# Patient Record
Sex: Female | Born: 1965 | Race: Black or African American | Hispanic: No | Marital: Single | Smoking: Never smoker
Health system: Southern US, Community
[De-identification: ages and names within clinical notes are randomized; demographics above are authoritative.]

## PROBLEM LIST (undated history)

## (undated) DIAGNOSIS — G40909 Epilepsy, unspecified, not intractable, without status epilepticus: Secondary | ICD-10-CM

## (undated) DIAGNOSIS — F17200 Nicotine dependence, unspecified, uncomplicated: Secondary | ICD-10-CM

## (undated) DIAGNOSIS — F1020 Alcohol dependence, uncomplicated: Secondary | ICD-10-CM

## (undated) DIAGNOSIS — D509 Iron deficiency anemia, unspecified: Secondary | ICD-10-CM

## (undated) DIAGNOSIS — I10 Essential (primary) hypertension: Secondary | ICD-10-CM

## (undated) HISTORY — DX: Nicotine dependence, unspecified, uncomplicated: F17.200

## (undated) HISTORY — DX: Essential (primary) hypertension: I10

## (undated) HISTORY — DX: Alcohol dependence, uncomplicated: F10.20

## (undated) HISTORY — PX: OTHER SURGICAL HISTORY: SHX169

## (undated) HISTORY — DX: Epilepsy, unspecified, not intractable, without status epilepticus: G40.909

## (undated) HISTORY — DX: Iron deficiency anemia, unspecified: D50.9

---

## 2000-08-30 ENCOUNTER — Encounter: Payer: Self-pay | Admitting: *Deleted

## 2000-08-30 ENCOUNTER — Emergency Department (HOSPITAL_COMMUNITY): Admission: EM | Admit: 2000-08-30 | Discharge: 2000-08-30 | Payer: Self-pay | Admitting: *Deleted

## 2000-11-15 ENCOUNTER — Emergency Department (HOSPITAL_COMMUNITY): Admission: EM | Admit: 2000-11-15 | Discharge: 2000-11-15 | Payer: Self-pay | Admitting: Emergency Medicine

## 2001-04-20 ENCOUNTER — Emergency Department (HOSPITAL_COMMUNITY): Admission: EM | Admit: 2001-04-20 | Discharge: 2001-04-20 | Payer: Self-pay | Admitting: Emergency Medicine

## 2001-09-01 ENCOUNTER — Encounter: Payer: Self-pay | Admitting: *Deleted

## 2001-09-01 ENCOUNTER — Emergency Department (HOSPITAL_COMMUNITY): Admission: EM | Admit: 2001-09-01 | Discharge: 2001-09-01 | Payer: Self-pay | Admitting: *Deleted

## 2002-01-17 ENCOUNTER — Observation Stay (HOSPITAL_COMMUNITY): Admission: EM | Admit: 2002-01-17 | Discharge: 2002-01-18 | Payer: Self-pay | Admitting: Emergency Medicine

## 2002-01-17 ENCOUNTER — Encounter: Payer: Self-pay | Admitting: Emergency Medicine

## 2002-09-29 ENCOUNTER — Emergency Department (HOSPITAL_COMMUNITY): Admission: EM | Admit: 2002-09-29 | Discharge: 2002-09-29 | Payer: Self-pay | Admitting: Emergency Medicine

## 2002-09-30 ENCOUNTER — Emergency Department (HOSPITAL_COMMUNITY): Admission: EM | Admit: 2002-09-30 | Discharge: 2002-09-30 | Payer: Self-pay | Admitting: Emergency Medicine

## 2002-10-01 ENCOUNTER — Emergency Department (HOSPITAL_COMMUNITY): Admission: EM | Admit: 2002-10-01 | Discharge: 2002-10-01 | Payer: Self-pay | Admitting: *Deleted

## 2002-10-02 ENCOUNTER — Emergency Department (HOSPITAL_COMMUNITY): Admission: EM | Admit: 2002-10-02 | Discharge: 2002-10-02 | Payer: Self-pay | Admitting: *Deleted

## 2002-12-03 ENCOUNTER — Emergency Department (HOSPITAL_COMMUNITY): Admission: EM | Admit: 2002-12-03 | Discharge: 2002-12-03 | Payer: Self-pay | Admitting: Emergency Medicine

## 2002-12-03 ENCOUNTER — Inpatient Hospital Stay (HOSPITAL_COMMUNITY): Admission: EM | Admit: 2002-12-03 | Discharge: 2002-12-05 | Payer: Self-pay | Admitting: Emergency Medicine

## 2004-09-26 ENCOUNTER — Emergency Department (HOSPITAL_COMMUNITY): Admission: EM | Admit: 2004-09-26 | Discharge: 2004-09-26 | Payer: Self-pay | Admitting: Emergency Medicine

## 2005-03-17 ENCOUNTER — Ambulatory Visit: Payer: Self-pay | Admitting: Family Medicine

## 2005-08-11 ENCOUNTER — Ambulatory Visit: Payer: Self-pay | Admitting: Family Medicine

## 2006-10-21 ENCOUNTER — Emergency Department (HOSPITAL_COMMUNITY): Admission: EM | Admit: 2006-10-21 | Discharge: 2006-10-21 | Payer: Self-pay | Admitting: Emergency Medicine

## 2006-10-27 ENCOUNTER — Ambulatory Visit: Payer: Self-pay | Admitting: Family Medicine

## 2006-11-02 ENCOUNTER — Encounter: Payer: Self-pay | Admitting: Family Medicine

## 2006-11-02 LAB — CONVERTED CEMR LAB
BUN: 5 mg/dL — ABNORMAL LOW (ref 6–23)
Basophils Absolute: 0.1 10*3/uL (ref 0.0–0.1)
Basophils Relative: 1 % (ref 0–1)
CO2: 25 meq/L (ref 19–32)
Calcium: 9.5 mg/dL (ref 8.4–10.5)
Chloride: 95 meq/L — ABNORMAL LOW (ref 96–112)
Creatinine, Ser: 0.64 mg/dL (ref 0.40–1.20)
Eosinophils Relative: 2 % (ref 0–5)
HCT: 39.5 % (ref 36.0–46.0)
HDL: 50 mg/dL (ref >39)
Hemoglobin: 12.2 g/dL (ref 12.0–15.0)
LDL Cholesterol: 72 mg/dL (ref 0–99)
Lymphocytes Relative: 22 % (ref 12–46)
MCHC: 30.9 g/dL (ref 30.0–36.0)
Monocytes Absolute: 1.2 10*3/uL — ABNORMAL HIGH (ref 0.2–0.7)
Monocytes Relative: 8 % (ref 3–11)
Neutro Abs: 10.3 10*3/uL — ABNORMAL HIGH (ref 1.7–7.7)
Phenytoin Lvl: 4.3 ug/mL — ABNORMAL LOW (ref 10.0–20.0)
RBC: 3.64 M/uL — ABNORMAL LOW (ref 3.87–5.11)
RDW: 13.8 % (ref 11.5–14.0)

## 2006-11-09 ENCOUNTER — Ambulatory Visit (HOSPITAL_COMMUNITY): Admission: RE | Admit: 2006-11-09 | Discharge: 2006-11-09 | Payer: Self-pay | Admitting: Family Medicine

## 2006-12-11 ENCOUNTER — Emergency Department (HOSPITAL_COMMUNITY): Admission: EM | Admit: 2006-12-11 | Discharge: 2006-12-11 | Payer: Self-pay | Admitting: Emergency Medicine

## 2006-12-14 ENCOUNTER — Emergency Department (HOSPITAL_COMMUNITY): Admission: EM | Admit: 2006-12-14 | Discharge: 2006-12-14 | Payer: Self-pay | Admitting: Emergency Medicine

## 2007-03-17 ENCOUNTER — Ambulatory Visit: Payer: Self-pay | Admitting: Family Medicine

## 2007-04-27 ENCOUNTER — Encounter: Payer: Self-pay | Admitting: Family Medicine

## 2007-11-10 DIAGNOSIS — R569 Unspecified convulsions: Secondary | ICD-10-CM

## 2007-11-10 DIAGNOSIS — F17200 Nicotine dependence, unspecified, uncomplicated: Secondary | ICD-10-CM

## 2007-11-15 ENCOUNTER — Ambulatory Visit: Payer: Self-pay | Admitting: Family Medicine

## 2007-11-15 LAB — CONVERTED CEMR LAB
ALT: 36 units/L — ABNORMAL HIGH (ref 0–35)
Albumin: 3.6 g/dL (ref 3.5–5.2)
Chloride: 104 meq/L (ref 96–112)
Cholesterol: 184 mg/dL (ref 0–200)
HDL: 87 mg/dL (ref >39)
LDL Cholesterol: 82 mg/dL (ref 0–99)
Phenytoin Lvl: 5.1 ug/mL — ABNORMAL LOW (ref 10.0–20.0)
Potassium: 4.2 meq/L (ref 3.5–5.3)
Sodium: 141 meq/L (ref 135–145)
TSH: 1.426 microintl units/mL (ref 0.350–4.50)
Total CHOL/HDL Ratio: 2.1
Total Protein: 7.8 g/dL (ref 6.0–8.3)
Triglycerides: 77 mg/dL (ref <150)
VLDL: 15 mg/dL (ref 0–40)

## 2008-03-11 ENCOUNTER — Emergency Department (HOSPITAL_COMMUNITY): Admission: EM | Admit: 2008-03-11 | Discharge: 2008-03-11 | Payer: Self-pay | Admitting: Emergency Medicine

## 2008-03-12 ENCOUNTER — Encounter: Payer: Self-pay | Admitting: Family Medicine

## 2008-10-26 ENCOUNTER — Emergency Department (HOSPITAL_COMMUNITY): Admission: EM | Admit: 2008-10-26 | Discharge: 2008-10-26 | Payer: Self-pay | Admitting: Emergency Medicine

## 2008-11-08 ENCOUNTER — Ambulatory Visit: Payer: Self-pay | Admitting: Family Medicine

## 2008-11-08 DIAGNOSIS — R5381 Other malaise: Secondary | ICD-10-CM

## 2008-11-08 DIAGNOSIS — R5383 Other fatigue: Secondary | ICD-10-CM

## 2008-11-13 ENCOUNTER — Ambulatory Visit (HOSPITAL_COMMUNITY): Admission: RE | Admit: 2008-11-13 | Discharge: 2008-11-13 | Payer: Self-pay | Admitting: Family Medicine

## 2008-11-21 ENCOUNTER — Encounter: Payer: Self-pay | Admitting: Family Medicine

## 2008-11-25 ENCOUNTER — Encounter: Payer: Self-pay | Admitting: Family Medicine

## 2008-11-25 LAB — CONVERTED CEMR LAB
ALT: 26 units/L (ref 0–35)
Albumin: 3.3 g/dL — ABNORMAL LOW (ref 3.5–5.2)
Alkaline Phosphatase: 112 units/L (ref 39–117)
BUN: 4 mg/dL — ABNORMAL LOW (ref 6–23)
Basophils Absolute: 0.1 10*3/uL (ref 0.0–0.1)
Cholesterol: 167 mg/dL (ref 0–200)
Creatinine, Ser: 0.55 mg/dL (ref 0.40–1.20)
Eosinophils Relative: 3 % (ref 0–5)
HCT: 31.4 % — ABNORMAL LOW (ref 36.0–46.0)
HDL: 55 mg/dL (ref >39)
Indirect Bilirubin: 0.3 mg/dL (ref 0.0–0.9)
Lymphocytes Relative: 30 % (ref 12–46)
Lymphs Abs: 3 10*3/uL (ref 0.7–4.0)
Neutro Abs: 5.4 10*3/uL (ref 1.7–7.7)
Neutrophils Relative %: 54 % (ref 43–77)
Platelets: 283 10*3/uL (ref 150–400)
Potassium: 4.4 meq/L (ref 3.5–5.3)
RDW: 17.7 % — ABNORMAL HIGH (ref 11.5–15.5)
Total Protein: 7.5 g/dL (ref 6.0–8.3)
Triglycerides: 96 mg/dL (ref <150)
VLDL: 19 mg/dL (ref 0–40)
WBC: 10 10*3/uL (ref 4.0–10.5)

## 2008-11-27 ENCOUNTER — Encounter: Payer: Self-pay | Admitting: Family Medicine

## 2009-02-10 ENCOUNTER — Encounter: Payer: Self-pay | Admitting: Family Medicine

## 2009-02-26 ENCOUNTER — Encounter: Payer: Self-pay | Admitting: Family Medicine

## 2009-03-02 ENCOUNTER — Emergency Department (HOSPITAL_COMMUNITY): Admission: EM | Admit: 2009-03-02 | Discharge: 2009-03-02 | Payer: Self-pay | Admitting: Emergency Medicine

## 2009-04-07 ENCOUNTER — Encounter (INDEPENDENT_AMBULATORY_CARE_PROVIDER_SITE_OTHER): Payer: Self-pay | Admitting: *Deleted

## 2009-11-27 ENCOUNTER — Telehealth: Payer: Self-pay | Admitting: Family Medicine

## 2009-11-30 ENCOUNTER — Emergency Department (HOSPITAL_COMMUNITY): Admission: EM | Admit: 2009-11-30 | Discharge: 2009-11-30 | Payer: Self-pay | Admitting: Emergency Medicine

## 2009-12-01 ENCOUNTER — Ambulatory Visit: Payer: Self-pay | Admitting: Family Medicine

## 2009-12-01 DIAGNOSIS — R1013 Epigastric pain: Secondary | ICD-10-CM

## 2009-12-01 DIAGNOSIS — D509 Iron deficiency anemia, unspecified: Secondary | ICD-10-CM | POA: Insufficient documentation

## 2009-12-01 DIAGNOSIS — K3189 Other diseases of stomach and duodenum: Secondary | ICD-10-CM

## 2009-12-08 ENCOUNTER — Telehealth: Payer: Self-pay | Admitting: Family Medicine

## 2009-12-08 ENCOUNTER — Encounter (HOSPITAL_COMMUNITY): Admission: RE | Admit: 2009-12-08 | Discharge: 2010-01-07 | Payer: Self-pay | Admitting: Family Medicine

## 2009-12-08 ENCOUNTER — Ambulatory Visit (HOSPITAL_COMMUNITY): Payer: Self-pay | Admitting: Family Medicine

## 2009-12-08 DIAGNOSIS — F1020 Alcohol dependence, uncomplicated: Secondary | ICD-10-CM | POA: Insufficient documentation

## 2009-12-09 ENCOUNTER — Ambulatory Visit (HOSPITAL_COMMUNITY): Payer: Self-pay | Admitting: Family Medicine

## 2009-12-10 ENCOUNTER — Encounter: Payer: Self-pay | Admitting: Family Medicine

## 2009-12-11 ENCOUNTER — Encounter: Payer: Self-pay | Admitting: Family Medicine

## 2009-12-12 LAB — CONVERTED CEMR LAB
ALT: 22 units/L (ref 0–35)
AST: 40 units/L — ABNORMAL HIGH (ref 0–37)
Albumin: 3.8 g/dL (ref 3.5–5.2)
Basophils Absolute: 0.1 10*3/uL (ref 0.0–0.1)
Basophils Relative: 1 % (ref 0–1)
Bilirubin, Direct: <0.1 mg/dL (ref 0.0–0.3)
Calcium: 8.9 mg/dL (ref 8.4–10.5)
Cholesterol: 172 mg/dL (ref 0–200)
Eosinophils Absolute: 0.3 10*3/uL (ref 0.0–0.7)
Eosinophils Relative: 3 % (ref 0–5)
HCT: 24.7 % — ABNORMAL LOW (ref 36.0–46.0)
HDL: 67 mg/dL (ref >39)
MCV: 71.4 fL — ABNORMAL LOW (ref 78.0–100.0)
Platelets: 223 10*3/uL (ref 150–400)
RDW: 20.7 % — ABNORMAL HIGH (ref 11.5–15.5)
Sodium: 135 meq/L (ref 135–145)
TSH: 0.994 microintl units/mL (ref 0.350–4.500)
Total CHOL/HDL Ratio: 2.6
Triglycerides: 62 mg/dL (ref <150)

## 2010-01-01 ENCOUNTER — Other Ambulatory Visit: Admission: RE | Admit: 2010-01-01 | Discharge: 2010-01-01 | Payer: Self-pay | Admitting: Family Medicine

## 2010-01-01 ENCOUNTER — Encounter: Payer: Self-pay | Admitting: Physician Assistant

## 2010-01-01 ENCOUNTER — Ambulatory Visit: Payer: Self-pay | Admitting: Family Medicine

## 2010-01-01 LAB — CONVERTED CEMR LAB: OCCULT 1: NEGATIVE

## 2010-01-07 ENCOUNTER — Encounter: Payer: Self-pay | Admitting: Family Medicine

## 2010-01-20 ENCOUNTER — Encounter: Payer: Self-pay | Admitting: Family Medicine

## 2010-01-20 LAB — CONVERTED CEMR LAB
Basophils Relative: 2 % — ABNORMAL HIGH (ref 0–1)
Hemoglobin: 10 g/dL — ABNORMAL LOW (ref 12.0–15.0)
Lymphocytes Relative: 36 % (ref 12–46)
MCHC: 30.1 g/dL (ref 30.0–36.0)
Monocytes Absolute: 1.3 10*3/uL — ABNORMAL HIGH (ref 0.1–1.0)
Monocytes Relative: 13 % — ABNORMAL HIGH (ref 3–12)
Neutro Abs: 4.2 10*3/uL (ref 1.7–7.7)
Phenytoin Lvl: 14.1 ug/mL (ref 10.0–20.0)
RBC: 4.22 M/uL (ref 3.87–5.11)

## 2010-01-30 ENCOUNTER — Telehealth: Payer: Self-pay | Admitting: Family Medicine

## 2010-02-09 ENCOUNTER — Encounter (INDEPENDENT_AMBULATORY_CARE_PROVIDER_SITE_OTHER): Payer: Self-pay | Admitting: *Deleted

## 2010-02-24 ENCOUNTER — Encounter (INDEPENDENT_AMBULATORY_CARE_PROVIDER_SITE_OTHER): Payer: Self-pay | Admitting: *Deleted

## 2010-02-24 ENCOUNTER — Ambulatory Visit: Payer: Self-pay | Admitting: Internal Medicine

## 2010-02-24 DIAGNOSIS — A048 Other specified bacterial intestinal infections: Secondary | ICD-10-CM

## 2010-02-24 HISTORY — DX: Other specified bacterial intestinal infections: A04.8

## 2010-03-03 ENCOUNTER — Ambulatory Visit: Payer: Self-pay | Admitting: Internal Medicine

## 2010-03-10 ENCOUNTER — Ambulatory Visit (HOSPITAL_COMMUNITY): Admission: RE | Admit: 2010-03-10 | Discharge: 2010-03-10 | Payer: Self-pay | Admitting: Internal Medicine

## 2010-03-10 ENCOUNTER — Ambulatory Visit: Payer: Self-pay | Admitting: Internal Medicine

## 2010-03-25 ENCOUNTER — Encounter: Payer: Self-pay | Admitting: Internal Medicine

## 2010-03-26 ENCOUNTER — Encounter: Payer: Self-pay | Admitting: Internal Medicine

## 2010-04-07 ENCOUNTER — Ambulatory Visit: Payer: Self-pay | Admitting: Family Medicine

## 2010-04-21 ENCOUNTER — Ambulatory Visit: Payer: Self-pay | Admitting: Internal Medicine

## 2010-04-21 DIAGNOSIS — I10 Essential (primary) hypertension: Secondary | ICD-10-CM

## 2010-04-28 ENCOUNTER — Ambulatory Visit (HOSPITAL_COMMUNITY)
Admission: RE | Admit: 2010-04-28 | Discharge: 2010-04-28 | Payer: Self-pay | Source: Home / Self Care | Attending: Family Medicine | Admitting: Family Medicine

## 2010-05-17 ENCOUNTER — Encounter: Payer: Self-pay | Admitting: Family Medicine

## 2010-05-18 ENCOUNTER — Encounter: Payer: Self-pay | Admitting: Family Medicine

## 2010-05-21 ENCOUNTER — Encounter: Payer: Self-pay | Admitting: Gastroenterology

## 2010-05-21 LAB — CONVERTED CEMR LAB
Basophils Relative: 1 % (ref 0–1)
Eosinophils Absolute: 0.4 10*3/uL (ref 0.0–0.7)
Eosinophils Relative: 3 % (ref 0–5)
MCHC: 30.9 g/dL (ref 30.0–36.0)
MCV: 90.1 fL (ref 78.0–100.0)
Neutrophils Relative %: 59 % (ref 43–77)
Platelets: 346 10*3/uL (ref 150–400)

## 2010-05-22 LAB — CONVERTED CEMR LAB: Phenytoin Lvl: 20.4 ug/mL — ABNORMAL HIGH (ref 10.0–20.0)

## 2010-05-23 ENCOUNTER — Emergency Department (HOSPITAL_COMMUNITY)
Admission: EM | Admit: 2010-05-23 | Discharge: 2010-05-23 | Payer: Self-pay | Source: Home / Self Care | Admitting: Emergency Medicine

## 2010-05-23 LAB — URINALYSIS, ROUTINE W REFLEX MICROSCOPIC
Leukocytes, UA: NEGATIVE
Nitrite: POSITIVE — AB
Specific Gravity, Urine: <1.005 — ABNORMAL LOW (ref 1.005–1.030)
Urobilinogen, UA: 0.2 mg/dL (ref 0.0–1.0)

## 2010-05-23 LAB — POCT PREGNANCY, URINE: Preg Test, Ur: NEGATIVE

## 2010-05-23 LAB — URINE MICROSCOPIC-ADD ON

## 2010-05-26 NOTE — Letter (Signed)
Summary: [facility] POSS EGD ORDER  [facility] POSS EGD ORDER   Imported By: Ave Filter 02/24/2010 12:01:26  _____________________________________________________________________  External Attachment:    Type:   Image     Comment:   External Document

## 2010-05-26 NOTE — Progress Notes (Signed)
  Phone Note Outgoing Call   Call placed by: Adella Hare, LPN Call placed to: Patient Summary of Call: attempted to call patient reguarding lab results, # in chart is disconnected, no other contact # in chart or in old messages. called [place] police dept and had them check patients home, they returned my call stating only that the officer spoke to someone at the residence and was told patient had just left for school Initial call taken by: Adella Hare LPN,  December 08, 2009 9:12 AM

## 2010-05-26 NOTE — Letter (Signed)
Summary: Out of Work Note  [facility]  [address]   [place], [place] [address]   Phone: [phone]  Fax: [phone]    02/24/2010  TO: WHOM IT MAY CONCERN  RE: Cynthia Mclean [address] [place] 1965/09/27       The above named individual is currently under my care and will be out of work    FROM: 02/24/2010   THROUGH: 02/24/2010     MAY RETURN ON: 02/24/2010     If you have any further questions or need additional information, please call.     Sincerely,     [facility] R. Roetta Sessions, M.D.    Jonette Eva, M.D. Lorenza Burton, FNP-BC    Tana Coast, PA-C Phone: [phone]    Fax: [phone]

## 2010-05-26 NOTE — Letter (Signed)
Summary: demographic  demographic   Imported By: Curtis Sites 10/15/2009 10:09:36  _____________________________________________________________________  External Attachment:    Type:   Image     Comment:   External Document

## 2010-05-26 NOTE — Progress Notes (Signed)
Summary: refill blood pressure pills  Phone Note Call from Patient   Summary of Call: would like to get a refill on blood pressure medicine. [phone] [facility]  Initial call taken by: Rudene Anda,  January 30, 2010 10:23 AM  Follow-up for Phone Call        PATIENT IS NOT ON ANY BP MEDS FROM THIS OFFICE CALLED PATIENT, LEFT MESSAGE Follow-up by: Adella Hare LPN,  January 30, 2010 11:08 AM  Additional Follow-up for Phone Call Additional follow up Details #1::        returned call, left message Additional Follow-up by: Adella Hare LPN,  January 30, 2010 1:34 PM

## 2010-05-26 NOTE — Assessment & Plan Note (Signed)
Summary: follow up and needs note for work/slj   Vital Signs:  Patient profile:   45 year old female Menstrual status:  regular LMP:     11/04/2009 Height:      65.5 inches Weight:      162 pounds BMI:     26.64 O2 Sat:      94 % Pulse rate:   83 / minute Pulse rhythm:   regular Resp:     16 per minute BP sitting:   130 / 82  (left arm) Cuff size:   regular  Vitals Entered By: Everitt Amber LPN (December 01, 2009 4:11 PM)  Nutrition Counseling: Patient's BMI is greater than 25 and therefore counseled on weight management options. CC: Follow up chronic problems, Depression LMP (date): 11/04/2009     Enter LMP: 11/04/2009   CC:  Follow up chronic problems and Depression.  History of Present Illness: Reports  that she has been doing fairly well. She still drinks alcohol, repportedly less, ansd continues to smoke. She denies any seizure activity since her last visit. Denies recent fever or chills. Denies sinus pressure, nasal congestion , ear pain or sore throat. Denies chest congestion, or cough productive of sputum. Denies chest pain, palpitations, PND, orthopnea or leg swelling. Deniesnausea, vomitting, diarrhea or constipation. Denies change in bowel movements or bloody stool. Denies dysuria , frequency, incontinence or hesitancy. Denies  joint pain, swelling, or reduced mobility. Denies headaches, vertigo, seizures. Denies depression, anxiety or insomnia. Denies  rash, lesions, or itch.     Current Medications (verified): 1)  Dilantin 100 Mg  Caps (Phenytoin Sodium Extended) .... Take Three Tablets By Mouth At Bedtime  Allergies (verified): No Known Drug Allergies  Review of Systems      See HPI General:  Complains of fatigue. Eyes:  Denies blurring and discharge. GI:  Complains of abdominal pain; 2 month h/o dyspepsia and epigastric pain intermittently. Endo:  Denies excessive thirst and excessive urination. Heme:  Denies abnormal bruising and  bleeding. Allergy:  Denies hives or rash and itching eyes.  Physical Exam  General:  Well-developed,well-nourished,in no acute distress; alert,appropriate and cooperative throughout examination HEENT: No facial asymmetry,  EOMI, No sinus tenderness, TM's Clear, oropharynx  pink and moist.   Chest: Clear to auscultation bilaterally.  CVS: S1, S2, No murmurs, No S3.   Abd: Soft, Nontender.  MS: Adequate ROM spine, hips, shoulders and knees.  Ext: No edema.   CNS: CN 2-12 intact, power tone and sensation normal throughout.   Skin: Intact, no visible lesions or rashes.  Psych: Good eye contact, normal affect.  Memory intact, not anxious or depressed appearing.    Impression & Recommendations:  Problem # 1:  DYSPEPSIA (ICD-536.8) Assessment Deteriorated  Orders: TLB-H. Pylori Abs(Helicobacter Pylori) ([id number])  Problem # 2:  ANEMIA, IRON DEFICIENCY (ICD-280.9) Assessment: Comment Only  Orders: T-Iron ([id number])  Hgb: 9.7 (11/21/2008)   Hct: 31.4 (11/21/2008)   Platelets: 283 (11/21/2008) RBC: 3.36 (11/21/2008)   RDW: 17.7 (11/21/2008)   WBC: 10.0 (11/21/2008) MCV: 93.5 (11/21/2008)   MCHC: 30.9 (11/21/2008) Retic Ct: TNP K/uL (11/25/2008)   Ferritin: 21 (11/25/2008) Iron: 18 (11/25/2008)   TIBC: 232 (11/25/2008)   % Sat: 8 (11/25/2008) B12: 450 (11/25/2008)   Folate: 4.8 (11/25/2008)   TSH: 1.426 (11/15/2007)  Problem # 3:  FATIGUE (ICD-780.79) Assessment: Unchanged  Orders: T-Basic Metabolic Panel [id number]) T-CBC w/Diff [id number]) T-TSH [phone])  Problem # 4:  NICOTINE ADDICTION (ICD-305.1) Assessment: Unchanged  Encouraged smoking cessation and discussed  different methods for smoking cessation.   Problem # 5:  SEIZURE DISORDER (ICD-780.39) Assessment: Unchanged  Her updated medication list for this problem includes:    Dilantin 100 Mg Caps (Phenytoin sodium extended) .Marland Kitchen... Take three tablets by mouth at bedtime  Orders: T-Dilantin  (Phenytoin) ([id number])  Problem # 6:  ALCOHOLISM (ICD-303.90) Assessment: Unchanged  Complete Medication List: 1)  Dilantin 100 Mg Caps (Phenytoin sodium extended) .... Take three tablets by mouth at bedtime  Other Orders: T-Lipid Profile [id number]) T-Hepatic Function [phone]) Future Orders: Radiology Referral (Radiology) ... 12/02/2009   Patient Instructions: 1)  CPE  first week in September. 2)  It is important that you exercise regularly at least 20 minutes 5 times a week. If you develop chest pain, have severe difficulty breathing, or feel very tired , stop exercising immediately and seek medical attention. 3)  You need to lose weight. Consider a lower calorie diet and regular exercise.  4)  It is not healthy  for men to drink more than 2-3 drinks per day or for women to drink more than 1-2 drinks per day. 5)  BMP prior to visit, ICD-9: 6)  Lipid Panel prior to visit, ICD-9:, hepatic and dilantin level  fasting asap 7)  TSH prior to visit, ICD-9: 8)  CBC w/ Diff prior to visit, ICD-9: and iron level, h pylori

## 2010-05-26 NOTE — Miscellaneous (Signed)
  Clinical Lists Changes  Medications: Added new medication of PHENYTOIN SODIUM EXTENDED 300 MG CAPS (PHENYTOIN SODIUM EXTENDED) Take 1 capsule by mouth two times a day - Signed Rx of PHENYTOIN SODIUM EXTENDED 300 MG CAPS (PHENYTOIN SODIUM EXTENDED) Take 1 capsule by mouth two times a day;  #60 x 3;  Signed;  Entered by: [name] MD;  Authorized by: [name] MD;  Method used: Printed then faxed to [facility] [address] [phone]*, [address], [place], [place]  [address], Ph: [phone], Fax: [phone]    Prescriptions: PHENYTOIN SODIUM EXTENDED 300 MG CAPS (PHENYTOIN SODIUM EXTENDED) Take 1 capsule by mouth two times a day  #60 x 3   Entered and Authorized by:   [name] MD   Signed by:   [name] MD on 12/11/2009   Method used:   Printed then faxed to ...       [facility] [address] [phone]* (retail)       [address], [place]  [address]       Ph: [phone]       Fax: [phone]   RxID:   [id number]

## 2010-05-26 NOTE — Letter (Signed)
Summary: Work Excuse  [facility]  [address]   [place], [place] [address]   Phone: [phone]  Fax: [phone]    Today's Date: January 01, 2010  Name of Patient: Cynthia Mclean  The above named patient had a medical visit today. Patient was in our office from 2pm to 3:40 pm.  Please take this into consideration when reviewing the time away from work/school.    Special Instructions:  [ * ] None  [  ] To be off the remainder of today, returning to the normal work / school schedule tomorrow.  [  ] To be off until the next scheduled appointment on ______________________.  [  ] Other ________________________________________________________________ ________________________________________________________________________   Sincerely yours,   [name], MD

## 2010-05-26 NOTE — Miscellaneous (Signed)
  Clinical Lists Changes  Medications: Added new medication of PREVPAC  MISC (AMOXICILL-CLARITHRO-LANSOPRAZ) Use as directed - Signed Rx of PREVPAC  MISC (AMOXICILL-CLARITHRO-LANSOPRAZ) Use as directed;  #1 x 0;  Signed;  Entered by: [name] MD;  Authorized by: [name] MD;  Method used: Electronically to [facility] [address] [phone]*, [address], [place], [place]  [address], Ph: [phone], Fax: [phone]    Prescriptions: PREVPAC  MISC (AMOXICILL-CLARITHRO-LANSOPRAZ) Use as directed  #1 x 0   Entered and Authorized by:   [name] MD   Signed by:   [name] MD on 12/10/2009   Method used:   Electronically to        [facility] [address] [phone]* (retail)       [address], [place]  [address]       Ph: [phone]       Fax: [phone]   RxID:   [id number]

## 2010-05-26 NOTE — Progress Notes (Signed)
Summary: note for work  Phone Note Call from Patient   Summary of Call: Patient called in this morning and needs a note for her work stating that you give her medication for seizure's.  She would like to pick note up today. Thanks Initial call taken by: Curtis Sites,  November 27, 2009 8:56 AM  Follow-up for Phone Call        she needs to be seen as an appt, has not been here since 02/2009, i am sending her for bloodwoprk at that time, she cvan come in today, pls give her a time, thanks Follow-up by: [name] MD,  November 27, 2009 10:23 AM  Additional Follow-up for Phone Call Additional follow up Details #1::        patient called back and didn't want an appt, states she hasn't been sick or needed to come in, I told her that you requested her to come in and she states she will call us back. Additional Follow-up by: Curtis Sites,  November 27, 2009 3:33 PM    Additional Follow-up for Phone Call Additional follow up Details #2::    noted Follow-up by: [name] MD,  November 27, 2009 4:17 PM

## 2010-05-26 NOTE — Letter (Signed)
Summary: misc  misc   Imported By: Curtis Sites 10/15/2009 11:30:47  _____________________________________________________________________  External Attachment:    Type:   Image     Comment:   External Document

## 2010-05-26 NOTE — Assessment & Plan Note (Signed)
Summary: physical   Vital Signs:  Patient profile:   45 year old female Menstrual status:  regular LMP:     12/08/2009 Height:      65.5 inches Weight:      158.25 pounds BMI:     26.03 O2 Sat:      100 % on Room air Pulse rate:   73 / minute Pulse rhythm:   regular Resp:     16 per minute BP sitting:   142 / 90  (left arm)  Vitals Entered By: Adella Hare LPN (January 01, 2010 2:51 PM)  Nutrition Counseling: Patient's BMI is greater than 25 and therefore counseled on weight management options.  O2 Flow:  Room air CC: physical Is Patient Diabetic? No Pain Assessment Patient in pain? no       Vision Screening:Left eye w/o correction: 20 / 40 Right Eye w/o correction: 20 / 20 Both eyes w/o correction:  20/ 30        Vision Entered By: Adella Hare LPN (January 01, 2010 2:52 PM) LMP (date): 12/08/2009     Enter LMP: 12/08/2009   CC:  physical.  History of Present Illness: Reports  that she has been doing well. Denies recent fever or chills. Denies sinus pressure, nasal congestion , ear pain or sore throat. Denies chest congestion, or cough productive of sputum. Denies chest pain, palpitations, PND, orthopnea or leg swelling. Denies abdominal pain, nausea, vomitting, diarrhea or constipation. Denies change in bowel movements or bloody stool. Denies dysuria , frequency, incontinence or hesitancy. Denies  joint pain, swelling, or reduced mobility. Denies headaches, vertigo, seizures. Denies depression, anxiety or insomnia. Denies  rash, lesions, or itch.     Preventive Screening-Counseling & Management  Alcohol-Tobacco     Smoking Cessation Counseling: yes  Current Medications (verified): 1)  Prevpac  Misc (Amoxicill-Clarithro-Lansopraz) .... Use As Directed 2)  Phenytoin Sodium Extended 300 Mg Caps (Phenytoin Sodium Extended) .... Take 1 Capsule By Mouth Two Times A Day  Allergies (verified): No Known Drug Allergies  Review of Systems  See HPI Eyes:  Denies discharge, eye pain, and red eye. Endo:  Denies excessive thirst and excessive urination. Heme:  Denies abnormal bruising and bleeding. Allergy:  Denies hives or rash and itching eyes.  Physical Exam  General:  Well-developed,well-nourished,in no acute distress; alert,appropriate and cooperative throughout examination Head:  Normocephalic and atraumatic without obvious abnormalities. No apparent alopecia or balding. Eyes:  No corneal or conjunctival inflammation noted. EOMI. Perrla. Funduscopic exam benign, without hemorrhages, exudates or papilledema. Vision grossly normal. Ears:  External ear exam shows no significant lesions or deformities.  Otoscopic examination reveals clear canals, tympanic membranes are intact bilaterally without bulging, retraction, inflammation or discharge. Hearing is grossly normal bilaterally. Nose:  External nasal examination shows no deformity or inflammation. Nasal mucosa are pink and moist without lesions or exudates. Mouth:  pharynx pink and moist, poor dentition, and teeth missing.   Neck:  No deformities, masses, or tenderness noted. Chest Wall:  No deformities, masses, or tenderness noted. Breasts:  No mass, nodules, thickening, tenderness, bulging, retraction, inflamation, nipple discharge or skin changes noted.   Lungs:  Normal respiratory effort, chest expands symmetrically. Lungs are clear to auscultation, no crackles or wheezes.Decreased air entry bilaterally Heart:  Normal rate and regular rhythm. S1 and S2 normal without gallop, murmur, click, rub or other extra sounds. Abdomen:  Bowel sounds positive,abdomen soft and non-tender without masses, organomegaly or hernias noted. Rectal:  No external abnormalities noted.  Normal sphincter tone. No rectal masses or tenderness. Genitalia:  Normal introitus for age, no external lesions, no vaginal discharge, mucosa pink and moist, no vaginal or cervical lesions, no vaginal atrophy, no  friaility or hemorrhage, normal uterus size and position, no adnexal masses or tenderness Msk:  No deformity or scoliosis noted of thoracic or lumbar spine.   Pulses:  R and L carotid,radial,femoral,dorsalis pedis and posterior tibial pulses are full and equal bilaterally Extremities:  No clubbing, cyanosis, edema, or deformity noted with normal full range of motion of all joints.   Neurologic:  No cranial nerve deficits noted. Station and gait are normal. Plantar reflexes are down-going bilaterally. DTRs are symmetrical throughout. Sensory, motor and coordinative functions appear intact. Skin:  Intact without suspicious lesions or rashes Cervical Nodes:  No lymphadenopathy noted Axillary Nodes:  No palpable lymphadenopathy Inguinal Nodes:  No significant adenopathy Psych:  Cognition and judgment appear intact. Alert and cooperative with normal attention span and concentration. No apparent delusions, illusions, hallucinations   Impression & Recommendations:  Problem # 1:  ANEMIA, IRON DEFICIENCY (ICD-280.9) Assessment Comment Only  Orders: Gastroenterology Referral (GI) T-CBC w/Diff ([id number]) T-Ferritin ([id number]) Augusto Gamble ([id number]) Of note , pt was recently transfused  Problem # 2:  NICOTINE ADDICTION (ICD-305.1) Assessment: Unchanged  Encouraged smoking cessation and discussed different methods for smoking cessation.   Problem # 3:  SEIZURE DISORDER (ICD-780.39) Assessment: Comment Only  The following medications were removed from the medication list:    Dilantin 100 Mg Caps (Phenytoin sodium extended) .Marland Kitchen... Take three tablets by mouth at bedtime Her updated medication list for this problem includes:    Phenytoin Sodium Extended 300 Mg Caps (Phenytoin sodium extended) .Marland Kitchen... Take 1 capsule by mouth two times a day non compliant with meds Orders: T-Dilantin (Phenytoin) ([id number])  Complete Medication List: 1)  Prevpac Misc (Amoxicill-clarithro-lansopraz) .... Use  as directed 2)  Phenytoin Sodium Extended 300 Mg Caps (Phenytoin sodium extended) .... Take 1 capsule by mouth two times a day  Other Orders: Hemoccult Guaiac-1 spec.(in office) ([id number]) Pap Smear (18841)  Patient Instructions: 1)  Please schedule a follow-up appointment in 3 months. 2)  Tobacco is very bad for your health and your loved ones! You Should stop smoking!. 3)  Stop Smoking Tips: Choose a Quit date. Cut down before the Quit date. decide what you will do as a substitute when you feel the urge to smoke(gum,toothpick,exercise). 4)  It is not healthy  for men to drink more than 2-3 drinks per day or for women to drink more than 1-2 drinks per day. 5)  Your bP is high, you need to cut back on salt, and cigarettes, eat more fruit and veg  140/90 6)  CBC w/ Diff prior to visit, ICD-9:, iron and ferritin today and dilantin level 7)  You are being referred to GI about the anemia  Laboratory Results  Date/Time Received: January 01, 2010 3:46 PM  Date/Time Reported: January 01, 2010 3:46 PM   Stool - Occult Blood Hemmoccult #1: negative Date: 01/01/2010 Comments: [id number] 1L 3/12 118 10/12

## 2010-05-26 NOTE — Progress Notes (Signed)
  Phone Note Call from Patient   Summary of Call: Patient aware that med sent in  Initial call taken by: Everitt Amber LPN,  January 30, 2010 1:51 PM  Follow-up for Phone Call        pls advise med sent in , neeeds ov within 6 weeks, has only got 2 months supply Follow-up by: [name] MD,  January 30, 2010 1:49 PM    New/Updated Medications: LOTENSIN 10 MG TABS (BENAZEPRIL HCL) Take 1 tablet by mouth once a day Prescriptions: LOTENSIN 10 MG TABS (BENAZEPRIL HCL) Take 1 tablet by mouth once a day  #30 x 1   Entered and Authorized by:   [name] MD   Signed by:   [name] MD on 01/30/2010   Method used:   Electronically to        [facility] [address] [phone]* (retail)       [address], [place]  [address]       Ph: [phone]       Fax: [phone]   RxID:   [id number]

## 2010-05-26 NOTE — Miscellaneous (Signed)
Summary: Orders Update  Clinical Lists Changes  Orders: Added new Test order of T-CBC w/Diff ([id number]) - Signed 

## 2010-05-26 NOTE — Letter (Signed)
Summary: Letter  Letter   Imported By: Lind Guest 01/07/2010 14:33:20  _____________________________________________________________________  External Attachment:    Type:   Image     Comment:   External Document

## 2010-05-26 NOTE — Letter (Signed)
Summary: phone  phone   Imported By: Curtis Sites 10/15/2009 11:31:04  _____________________________________________________________________  External Attachment:    Type:   Image     Comment:   External Document

## 2010-05-26 NOTE — Assessment & Plan Note (Signed)
Summary: Cynthia Mclean DEFICIENCY/LAW   Visit Type:  Consult Referring Provider:  [name] Primary Care Provider:  [name]  Chief Complaint:  IDA.  History of Present Illness: Cynthia Mclean is a pleasant 45 y/o AA female, patient of Dr. [name], who presents for further evaluation of IDA. She has evidence of IDA dating back to 8/10. In 8/11, her hgb was 7.3 and patient states she had two units of prbcs. Last labs, hgb 10/33.2, MCV 78.7, iron 13, ferritin 20. Hemoccult status unknown. She denies constipation, diarrhea, melena, brbpr, abd pain, dysphagia, weight loss, n/v. She has some heartburn with certain foods. She denies heavy menses. Menstrual cycles once month, bleed five days, first two heavier. H.Pylori serologies were positive and she is currently taking Prevpac, has four days left. No prior TCS/EGD.   Current Medications (verified): 1)  Prevpac  Misc (Amoxicill-Clarithro-Lansopraz) .... Use As Directed 2)  Phenytoin Sodium Extended 300 Mg Caps (Phenytoin Sodium Extended) .... Take 1 Capsule By Mouth Two Times A Day 3)  Lotensin 10 Mg Tabs (Benazepril Hcl) .... Take 1 Tablet By Mouth Once A Day 4)  Iron Pills .... 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   Social History: EMPLOYED, works at [employer]. Five children.  Single Alcohol use-yes, drink on weekends and on Wed. Shares 40oz beer with 2 other people. Drug use-no DIP SNUFF  Review of Systems General:  Denies  fever, chills, sweats, anorexia, fatigue, weakness, and weight loss. Eyes:  Denies vision loss. ENT:  Denies nasal congestion, sore throat, hoarseness, and difficulty swallowing. CV:  Denies chest pains, angina, palpitations, dyspnea on exertion, and peripheral edema. Resp:  Denies dyspnea at rest, dyspnea with exercise, cough, sputum, and wheezing. GI:  See HPI. GU:  Denies urinary burning and blood in urine. MS:  Denies joint pain / LOM. Derm:  Denies rash and itching. Neuro:  Denies weakness, frequent headaches, memory loss, and confusion. Psych:  Denies depression and anxiety. Endo:  Denies unusual weight change. Heme:  Denies bruising and bleeding. Allergy:  Denies hives and rash.  Vital Signs:  Patient profile:   45 year old female Menstrual status:  regular Height:      65.5 inches Weight:      158 pounds BMI:     25.99 Temp:     98.3 degrees F oral Pulse rate:   68 / minute BP sitting:   132 / 80  (left arm) Cuff size:   regular  Vitals Entered By: Hendricks Limes LPN (February 24, 2010 10:57 AM)  Physical Exam  General:  Well developed, well nourished, no acute distress. Head:  Normocephalic and atraumatic. Eyes:  sclera nonicteric Mouth:  Oropharyngeal mucosa moist, pink.  No lesions, erythema or exudate.   poor dentition.   Neck:  Supple; no masses or thyromegaly. Lungs:  Clear throughout to auscultation. Heart:  Regular rate and rhythm; no murmurs, rubs,  or bruits. Abdomen:  Bowel sounds normal.  Abdomen is soft, nontender, nondistended.  No rebound or guarding.  No hepatosplenomegaly, masses or hernias.  No abdominal  bruits.  Rectal:  deferred until time of colonoscopy.   Extremities:  No clubbing, cyanosis, edema or deformities noted. Neurologic:  Alert and  oriented x4;  grossly normal neurologically. Skin:  Intact without significant lesions or rashes. Cervical Nodes:  No significant cervical adenopathy. Psych:  Alert and cooperative. Normal mood and  affect.  Impression & Recommendations:  Problem # 1:  ANEMIA, IRON DEFICIENCY (ICD-280.9)  Chronic IDA without obvious source. Hemoccult status unknown. No prior EGD/TCS. H.Pylori serologies positive and currently undergoing Prevpac. Colonoscopy+/-EGD to be performed in near future.  Risks, alternatives, and benefits including but not limited to the risk of reaction to medication, bleeding, infection, and perforation were addressed.  Patient voiced understanding and provided verbal consent. Hold iron seven days prior to TCS.   Collect ifobt.   Orders: Consultation Level III (98119) I would like to thank Dr. [name] for allowing [place] to take part in the care of this nice patient.

## 2010-05-26 NOTE — Letter (Signed)
Summary: Letter  Letter   Imported By: Lind Guest 01/21/2010 08:51:56  _____________________________________________________________________  External Attachment:    Type:   Image     Comment:   External Document

## 2010-05-26 NOTE — Letter (Signed)
Summary: lab  lab   Imported By: Curtis Sites 10/15/2009 11:30:02  _____________________________________________________________________  External Attachment:    Type:   Image     Comment:   External Document

## 2010-05-26 NOTE — Letter (Signed)
Summary: progress notes  progress notes   Imported By: Curtis Sites 10/15/2009 11:31:19  _____________________________________________________________________  External Attachment:    Type:   Image     Comment:   External Document

## 2010-05-26 NOTE — Letter (Signed)
Summary: history and physical  history and physical   Imported By: Curtis Sites 10/15/2009 10:09:58  _____________________________________________________________________  External Attachment:    Type:   Image     Comment:   External Document

## 2010-05-26 NOTE — Letter (Signed)
Summary: Work Excuse  [facility]  [address]   [place], [place] [address]   Phone: [phone]  Fax: [phone]    Today's Date: December 08, 2009  Name of Patient: Cynthia Mclean  The above named patient had a medical visit today.  Please take this into consideration when reviewing the time away from work/school.    Special Instructions:  [ * ] None  [  ] To be off the remainder of today, returning to the normal work / school schedule tomorrow.  [  ] To be off until the next scheduled appointment on ______________________.  [  ] Other ________________________________________________________________ ________________________________________________________________________   Sincerely yours,   [name], MD

## 2010-05-26 NOTE — Assessment & Plan Note (Signed)
Summary: DROPPED OFF STOOL/SS   LATE ENTRY: Pt returned one iFOBT and it was negative.     Allergies: No Known Drug Allergies  Other Orders: Immuno-chemical Fecal Occult (95621)

## 2010-05-26 NOTE — Letter (Signed)
Summary: Unable to Reach, Consult Scheduled  [facility]  [address]   [place], [place] [address]   Phone: [phone]  Fax: [phone]    02/09/2010  Cynthia Mclean [address] [place], [place]  [address] 05-12-1965   Dear Ms. Cynthia Mclean,   We have been unable to reach you by phone.  Please contact our office with an updated phone number.  At the recommendation of DR SIMPSON we have been asked to schedule you a consult with DR Jena Gauss for IRON DEFICIENY ANEMIA.   Please call our office at [phone].     Thank you,    Diana Eves  [facility] R. Roetta Sessions, M.D.    Jonette Eva, M.D. Lorenza Burton, FNP-BC    Tana Coast, PA-C Phone: [phone]    Fax: [phone]

## 2010-05-28 ENCOUNTER — Encounter (INDEPENDENT_AMBULATORY_CARE_PROVIDER_SITE_OTHER): Payer: Self-pay | Admitting: *Deleted

## 2010-05-28 NOTE — Letter (Signed)
Summary: Patient Notice, Endo Biopsy Results  [facility]  [address]   [place], [place] [address]   Phone: [phone]  Fax: [phone]       March 25, 2010   Cynthia Mclean [address] [place], [place]  [address] [birth year]    Dear Ms. Waldo Laine,  I am pleased to inform you that the biopsies taken during your recent endoscopic examination did not show any evidence of cancer upon pathologic examination. There was mild inflammation in your stomach but no residual H. pylori infection.  Additional information/recommendations:  You will need a CBC and office with [place] in 3 months.  Please call us if you are having persistent problems or have questions about your condition that have not been fully answered at this time.  Sincerely,    R. Roetta Sessions MD, FACP [facility] Ph: [phone]   Fax: [phone]   Appended Document: Patient Notice, Endo Biopsy Results mailed letter to pt  Appended Document: Patient Notice, Endo Biopsy Results reminder in computer

## 2010-05-28 NOTE — Assessment & Plan Note (Signed)
Summary: office visit   Vital Signs:  Patient profile:   45 year old female Menstrual status:  regular Height:      65.5 inches Weight:      166.50 pounds BMI:     27.38 O2 Sat:      98 % on Room air Pulse rate:   73 / minute Pulse rhythm:   regular Resp:     16 per minute BP sitting:   158 / 92  (left arm)  Vitals Entered By: Adella Hare LPN (April 21, 2010 8:33 AM)  Nutrition Counseling: Patient's BMI is greater than 25 and therefore counseled on weight management options.  O2 Flow:  Room air CC: follow-up visit Is Patient Diabetic? No   Primary Care Provider:  [name]  CC:  follow-up visit.  History of Present Illness: Reports  that tshe has been doing well. 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 Denies sinus pressure, nasal congestion , ear pain or sore throat. Denies chest congestion, or cough productive of sputum. Denies chest pain, palpitations, PND, orthopnea or leg swelling. Has been out of her BP meds x 1 week Denies abdominal pain, nausea, vomitting, diarrhea  Denies change in bowel movements or bloody stool. Denies dysuria , frequency, incontinence or hesitancy. Denies  joint pain, swelling, or reduced mobility. Denies headaches, vertigo, seizures. Denies depression, anxiety or insomnia. Denies  rash, lesions, or itch.     Current Medications (verified): 1)  Phenytoin Sodium Extended 300 Mg Caps (Phenytoin Sodium Extended) .... Take 1 Capsule By Mouth Two Times A Day 2)  Lotensin 10 Mg Tabs (Benazepril Hcl) .... Take 1 Tablet By Mouth Once A Day 3)  Iron Pills .... One Daily  Allergies (verified): No Known Drug Allergies  Social History: Reviewed history from 02/24/2010 and no changes required. 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  Single Alcohol use-yes, drink on weekends and on Wed. Shares 40oz beer with 2 other  people. Drug use-no DIP SNUFF  Review of Systems      See HPI General:  Complains of fatigue. Eyes:  Denies discharge and red eye. GI:  Complains of constipation; 4 day h/o constipation, generally regular, advisce given. Endo:  Denies cold intolerance, excessive hunger, excessive thirst, and excessive urination. Heme:  Denies abnormal bruising and bleeding. Allergy:  Denies hives or rash and itching eyes.  Physical Exam  General:  Well-developed,well-nourished,in no acute distress; alert,appropriate and cooperative throughout examination HEENT: No facial asymmetry,  EOMI, No sinus tenderness, TM's Clear, oropharynx  pink and moist. Poor dentition  Chest: Clear to auscultation bilaterally.  CVS: S1, S2, No murmurs, No S3.   Abd: Soft, Nontender.  MS: Adequate ROM spine, hips, shoulders and knees.  Ext: No edema.   CNS: CN 2-12 intact, power tone and sensation normal throughout.   Skin: Intact, no visible lesions or rashes.  Psych: Good eye contact, normal affect.  Memory intact, not anxious or depressed appearing.    Impression & Recommendations:  Problem # 1:  HYPERTENSION (ICD-401.9) Assessment Deteriorated  Her updated medication list for this problem includes:    Lotensin 10 Mg Tabs (Benazepril hcl) .Marland Kitchen... Take 1 tablet by mouth once a day  BP today: 158/92, needs meds , she is out of them Prior BP: 132/80 (02/24/2010)  Labs Reviewed: K+: 3.5 (12/05/2009) Creat: : 0.62 (12/05/2009)   Chol: 172 (12/05/2009)  HDL: 67 (12/05/2009)   LDL: 93 (12/05/2009)   TG: 62 (12/05/2009)  Problem # 2:  ALCOHOLISM (ICD-303.90) Assessment: Improved counselled on the importance of quitting  Problem # 3:  SEIZURE DISORDER (ICD-780.39) Assessment: Improved  Her updated medication list for this problem includes:    Phenytoin Sodium Extended 300 Mg Caps (Phenytoin sodium extended) .Marland Kitchen... Take 1 capsule by mouth two times a day  Orders: T-Dilantin (Phenytoin)  ([id number])  Complete Medication List: 1)  Phenytoin Sodium Extended 300 Mg Caps (Phenytoin sodium extended) .... Take 1 capsule by mouth two times a day 2)  Lotensin 10 Mg Tabs (Benazepril hcl) .... Take 1 tablet by mouth once a day 3)  Iron Pills  .... One daily  Other Orders: Radiology Referral (Radiology)  Patient Instructions: 1)  cPE ini feb 2012 2)  phenytoin level end January 3)  It is not healthy  for men to drink more than 2-3 drinks per day or for women to drink more th magnesium citrate or dulcolax tabletshest pain, have severe difficulty breathing, or feel very tired , stop exercising immediately and seek medical attention. 4)  increase the fiber and vegetable and fruit in your diet, 5)  try greens, and prune juice, if no bM then get oTC laxative, eg  6)  magnesium citrate or dulcolax tablets Prescriptions: PHENYTOIN SODIUM EXTENDED 300 MG CAPS (PHENYTOIN SODIUM EXTENDED) Take 1 capsule by mouth two times a day  #60 x 3   Entered by:   Adella Hare LPN   Authorized by:   [name] MD   Signed by:   Adella Hare LPN on 84/69/6295   Method used:   Electronically to        [facility] [address] [phone]* (retail)       [address] [place], [place]  [address]       Ph: [phone]       Fax: [phone]   RxID:   [id number] LOTENSIN 10 MG TABS (BENAZEPRIL HCL) Take 1 tablet by mouth once a day  #30 x 3   Entered by:   Adella Hare LPN   Authorized by:   [name] MD   Signed by:   Adella Hare LPN on 66/09/3014   Method used:   Electronically to        [facility] [phone]* (retail)       [address] [place], [place]  [address]       Ph: [phone]       Fax: [phone]   RxID:   [id number]    Orders Added: 1)  Radiology Referral [Radiology] 2)  Est. Patient Level IV [85462] 3)  T-Dilantin (Phenytoin) [[id number]]

## 2010-05-28 NOTE — Miscellaneous (Signed)
Summary: Orders Update  Clinical Lists Changes  Orders: Added new Test order of T-CBC w/Diff ([id number]) - Signed 

## 2010-05-28 NOTE — Assessment & Plan Note (Addendum)
Summary: f/u EGD/TCS for IDA, dc instructions indicate recheck CBC/MM   Visit Type:  Follow-up Visit Primary Care Provider:  Syliva Mclean  CC:  follow up- doing ok.  History of Present Illness: Cynthia Mclean presents today in f/u for IDA. In interim has undergone EGD/colon in Nov 2011 to assess for possible etiology of anemia. Findings wnl. See report below. Pt denies any overt evidence of GI bleeding. Denies n/v, no abdominal pain. 1 BM daily, denies constipation. Denies fatigue. Overall doing well. Does have monthly cycle, occasionally heavy periods for a few days but not extreme. Here to check bloodwork.   11/15/11IMPRESSION:  Colonoscopy normal rectum, colon, and terminal ileum.  EGD Findings:  Normal esophagus, mosaic appearing esophageal gastric mucosa of uncertain significance for gastric polyps, biopsied, questionable scalloping of the small bowel mucosa status post biopsy.  Current Medications (verified): 1)  Phenytoin Sodium Extended 300 Mg Caps (Phenytoin Sodium Extended) .... Take 1 Capsule By Mouth Two Times A Day 2)  Lotensin 10 Mg Tabs (Benazepril Hcl) .... Take 1 Tablet By Mouth Once A Day 3)  Iron Pills .... 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 Eyes:  Denies blurring, irritation, and discharge. ENT:  Denies sore throat, hoarseness, and difficulty swallowing. CV:  Denies chest pains and syncope. Resp:  Denies dyspnea at rest and wheezing. GI:  Denies difficulty swallowing, pain on swallowing, nausea, abdominal pain, constipation, change in bowel habits, bloody BM's, and black BMs. GU:  Denies urinary  burning and urinary frequency. MS:  Denies joint pain / LOM, joint swelling, and joint stiffness. Derm:  Denies rash, itching, and dry skin. Neuro:  Denies weakness and syncope. Psych:  Denies depression and anxiety. Endo:  Denies cold intolerance and heat intolerance.  Vital Signs:  Patient profile:   45 year old female Menstrual status:  regular Height:      65.5 inches Weight:      165 pounds BMI:     27.14 Temp:     97.8 degrees F oral Pulse rate:   76 / minute BP sitting:   130 / 92  (left arm) Cuff size:   regular  Vitals Entered By: Hendricks Limes LPN (May 05, 2010 10:26 AM)  Physical Exam  General:  Well developed, well nourished, no acute distress. Neck:  Supple; no masses or thyromegaly. Lungs:  Clear throughout to auscultation. Heart:  Regular rate and rhythm; no murmurs, rubs,  or bruits. Abdomen:  +BS, non-tender, non-distended, no HSM, no rebound or guarding Msk:  Symmetrical with no gross deformities. Normal posture. Neurologic:  Alert and  oriented x4;  grossly normal neurologically. Skin:  Intact without significant lesions or rashes. Psych:  Alert and cooperative. Normal mood and affect.  Impression & Recommendations:  Problem # 1:  ANEMIA, IRON DEFICIENCY (ICD-89.52) 45 year old female with hx of IDA, no overt GI bleed symptoms. Recent EGD/colonoscopy done Nov 2011 wnl. Will recheck CBC today. Further rec's to follow.  Orders: T-CBC w/Diff ([id number]) Est. Patient Level II (62130)  Appended Document: f/u EGD/TCS for IDA, dc instructions indicate recheck CBC/MM has pt gotten CBC drawn as requested at last visit?   Appended Document: f/u EGD/TCS for IDA, dc instructions indicate recheck CBC/MM [facility]  has she had labs done?  Appended Document: f/u EGD/TCS for IDA, dc instructions indicate recheck CBC/MM Pt left VM she will go tomorrow to get labs.

## 2010-06-03 NOTE — Letter (Signed)
Summary: Recall Office Visit  [facility]  [address]   [place], [place] [address]   Phone: [phone]  Fax: [phone]      May 28, 2010   ELFREDA BLANCHET [address] [place], [place]  [address] 07-07-1965   Dear Ms. Waldo Laine,   According to our records, it is time for you to schedule a follow-up office visit with [place].   At your convenience, please call [phone] to schedule an office visit. If you have any questions, concerns, or feel that this letter is in error, we would appreciate your call.   Sincerely,    Diana Eves  [facility] Ph: [phone]   Fax: [phone]

## 2010-06-15 ENCOUNTER — Ambulatory Visit: Payer: Self-pay | Admitting: Family Medicine

## 2010-06-16 ENCOUNTER — Encounter: Payer: Self-pay | Admitting: Family Medicine

## 2010-06-23 NOTE — Letter (Signed)
Summary: 1st no show  1st no show   Imported By: Lind Guest 06/16/2010 11:35:00  _____________________________________________________________________  External Attachment:    Type:   Image     Comment:   External Document

## 2010-06-24 ENCOUNTER — Encounter (INDEPENDENT_AMBULATORY_CARE_PROVIDER_SITE_OTHER): Payer: Medicaid Other | Admitting: Family Medicine

## 2010-06-24 ENCOUNTER — Encounter: Payer: Self-pay | Admitting: Family Medicine

## 2010-06-24 DIAGNOSIS — I10 Essential (primary) hypertension: Secondary | ICD-10-CM

## 2010-06-24 DIAGNOSIS — R569 Unspecified convulsions: Secondary | ICD-10-CM

## 2010-07-03 ENCOUNTER — Encounter (INDEPENDENT_AMBULATORY_CARE_PROVIDER_SITE_OTHER): Payer: Self-pay

## 2010-07-07 NOTE — Letter (Signed)
Summary: Recall, Labs Needed  [facility]  [address]   [place], [place] [address]   Phone: [phone]  Fax: [phone]    July 03, 2010  Cynthia Mclean [address] [place], [place]  [address]  [place] Jan 29, 1966   Dear Cynthia Mclean,   Our records indicate it is time to repeat your blood work.  You can take the enclosed form to the lab on or near the date indicated.  Please make note of the new location of the lab:   [address], 2nd floor   [facility]  Our office will call you within a week to ten business days with the results.  If you do not hear from [place] in 10 business days, you should call the office.  If you have any questions regarding this, call the office at [phone], and ask for the nurse.  Labs are due on 08/17/10.   Sincerely,    Hendricks Limes LPN  [facility] Ph: [phone]   Fax: [phone]

## 2010-07-07 NOTE — Assessment & Plan Note (Signed)
Summary: physical   Vital Signs:  Patient profile:   45 year old female Menstrual status:  regular Height:      65.5 inches Weight:      159 pounds BMI:     26.15 O2 Sat:      98 % Pulse rate:   73 / minute Resp:     16 per minute BP sitting:   110 / 82  (left arm) Cuff size:   regular  Vitals Entered By: Everitt Amber LPN (June 24, 2010 8:15 AM) CC: Follow up chronic problems, was scheduled for CPE but had one in Sept   Primary Care Provider:  [name]  CC:  Follow up chronic problems and was scheduled for CPE but had one in Sept.  History of Present Illness: Reports  that she is doing well. She has cut back on alcohol use and is using less nicotine Denies recent fever or chills. Denies sinus pressure, nasal congestion , ear pain or sore throat. Denies chest congestion, or cough productive of sputum. Denies chest pain, palpitations, PND, orthopnea or leg swelling. Denies abdominal pain, nausea, vomitting, diarrhea or constipation. Denies change in bowel movements or bloody stool. Denies dysuria , frequency, incontinence or hesitancy. Denies  joint pain, swelling, or reduced mobility. Denies headaches, vertigo, seizures. Denies depression, anxiety or insomnia. Denies  rash, lesions, or itch.     Current Medications (verified): 1)  Phenytoin Sodium Extended 300 Mg Caps (Phenytoin Sodium Extended) .... Take 1 Capsule By Mouth Two Times A Day 2)  Lotensin 10 Mg Tabs (Benazepril Hcl) .... Take 1 Tablet By Mouth Once A Day 3)  Iron Pills .... One Daily  Allergies (verified): No Known Drug Allergies  Review of Systems      See HPI Eyes:  Denies discharge and red eye. Endo:  Denies cold intolerance, excessive hunger, excessive thirst, and excessive urination. Heme:  Denies abnormal bruising, bleeding, and enlarge lymph nodes. Allergy:  Complains of seasonal allergies.  Physical Exam  General:  Well-developed,well-nourished,in no acute distress;  alert,appropriate and cooperative throughout examination HEENT: No facial asymmetry,  EOMI, No sinus tenderness, TM's Clear, oropharynx  pink and moist.   Chest: Clear to auscultation bilaterally.  CVS: S1, S2, No murmurs, No S3.   Abd: Soft, Nontender.  MS: Adequate ROM spine, hips, shoulders and knees.  Ext: No edema.   CNS: CN 2-12 intact, power tone and sensation normal throughout.   Skin: Intact, no visible lesions or rashes.  Psych: Good eye contact, normal affect.  Memory intact, not anxious or depressed appearing.    Impression & Recommendations:  Problem # 1:  HYPERTENSION (ICD-401.9) Assessment Improved  Her updated medication list for this problem includes:    Lotensin 10 Mg Tabs (Benazepril hcl) .Marland Kitchen... Take 1 tablet by mouth once a day  BP today: 110/82 Prior BP: 130/92 (05/05/2010)  Labs Reviewed: K+: 3.5 (12/05/2009) Creat: : 0.62 (12/05/2009)   Chol: 172 (12/05/2009)   HDL: 67 (12/05/2009)   LDL: 93 (12/05/2009)   TG: 62 (12/05/2009)  Problem # 2:  ALCOHOLISM (ICD-303.90) Assessment: Improved  Problem # 3:  NICOTINE ADDICTION (ICD-305.1) Assessment: Unchanged  Encouraged smoking cessation and discussed different methods for smoking cessation.   Problem # 4:  SEIZURE DISORDER (ICD-780.39) Assessment: Improved  Her updated medication list for this problem includes:    Phenytoin Sodium Extended 300 Mg Caps (Phenytoin sodium extended) .Marland Kitchen... Take 1 capsule by mouth two times a day  Complete Medication List: 1)  Phenytoin Sodium  Extended 300 Mg Caps (Phenytoin sodium extended) .... Take 1 capsule by mouth two times a day 2)  Lotensin 10 Mg Tabs (Benazepril hcl) .... Take 1 tablet by mouth once a day 3)  Iron Pills  .... One daily  Patient Instructions: 1)  CPE in 4.5 months, pls sched 8am appt. 2)  no med changes at this time   Orders Added: 1)  Est. Patient Level IV [04540]  Appended Document: physical pt's physical is not due toill sept 9 or after,  pls cancel sooner appt niand reschedule for  cPE for that time, thanks.. pls notify the pt of the change also  Appended Document: physical left message  Appended Document: physical appt. 9.10.2012

## 2010-07-09 LAB — CROSSMATCH: Antibody Screen: NEGATIVE

## 2010-07-09 LAB — HEMOGLOBIN AND HEMATOCRIT, BLOOD
HCT: 31 % — ABNORMAL LOW (ref 36.0–46.0)
Hemoglobin: 8 g/dL — ABNORMAL LOW (ref 12.0–15.0)

## 2010-07-29 LAB — DIFFERENTIAL
Basophils Absolute: 0.1 10*3/uL (ref 0.0–0.1)
Basophils Relative: 1 % (ref 0–1)
Eosinophils Absolute: 0.1 10*3/uL (ref 0.0–0.7)
Eosinophils Relative: 1 % (ref 0–5)
Monocytes Absolute: 0.9 10*3/uL (ref 0.1–1.0)

## 2010-07-29 LAB — COMPREHENSIVE METABOLIC PANEL
ALT: 25 U/L (ref 0–35)
AST: 94 U/L — ABNORMAL HIGH (ref 0–37)
Albumin: 3 g/dL — ABNORMAL LOW (ref 3.5–5.2)
Alkaline Phosphatase: 112 U/L (ref 39–117)
CO2: 25 mEq/L (ref 19–32)
Chloride: 98 mEq/L (ref 96–112)
Creatinine, Ser: 0.66 mg/dL (ref 0.4–1.2)
GFR calc Af Amer: >60 mL/min (ref >60)
GFR calc non Af Amer: >60 mL/min (ref >60)
Potassium: 3.4 mEq/L — ABNORMAL LOW (ref 3.5–5.1)
Total Bilirubin: 1.4 mg/dL — ABNORMAL HIGH (ref 0.3–1.2)

## 2010-07-29 LAB — URINALYSIS, ROUTINE W REFLEX MICROSCOPIC
Bilirubin Urine: NEGATIVE
Glucose, UA: NEGATIVE mg/dL
Hgb urine dipstick: NEGATIVE
Ketones, ur: NEGATIVE mg/dL
Nitrite: NEGATIVE
Protein, ur: NEGATIVE mg/dL
Specific Gravity, Urine: 1.01 (ref 1.005–1.030)
Urobilinogen, UA: 0.2 mg/dL (ref 0.0–1.0)
pH: 8.5 — ABNORMAL HIGH (ref 5.0–8.0)

## 2010-07-29 LAB — CBC
MCV: 90.3 fL (ref 78.0–100.0)
Platelets: 252 10*3/uL (ref 150–400)
RBC: 3.49 MIL/uL — ABNORMAL LOW (ref 3.87–5.11)
WBC: 13.7 10*3/uL — ABNORMAL HIGH (ref 4.0–10.5)

## 2010-07-29 LAB — LIPASE, BLOOD: Lipase: 18 U/L (ref 11–59)

## 2010-07-29 LAB — PHENYTOIN LEVEL, TOTAL: Phenytoin Lvl: 2.8 ug/mL — ABNORMAL LOW (ref 10.0–20.0)

## 2010-08-02 LAB — DIFFERENTIAL
Basophils Absolute: 0 10*3/uL (ref 0.0–0.1)
Basophils Relative: 0 % (ref 0–1)
Eosinophils Absolute: 0.2 K/uL (ref 0.0–0.7)
Eosinophils Relative: 2 % (ref 0–5)
Lymphocytes Relative: 21 % (ref 12–46)
Lymphs Abs: 2.4 K/uL (ref 0.7–4.0)
Monocytes Absolute: 1.6 K/uL — ABNORMAL HIGH (ref 0.1–1.0)
Monocytes Relative: 14 % — ABNORMAL HIGH (ref 3–12)
Neutro Abs: 7.3 10*3/uL (ref 1.7–7.7)
Neutrophils Relative %: 63 % (ref 43–77)

## 2010-08-02 LAB — BASIC METABOLIC PANEL WITH GFR
CO2: 25 meq/L (ref 19–32)
Chloride: 98 meq/L (ref 96–112)
GFR calc Af Amer: >60 mL/min (ref >60)
Potassium: 3.1 meq/L — ABNORMAL LOW (ref 3.5–5.1)
Sodium: 132 meq/L — ABNORMAL LOW (ref 135–145)

## 2010-08-02 LAB — PHENYTOIN LEVEL, TOTAL: Phenytoin Lvl: 5.9 ug/mL — ABNORMAL LOW (ref 10.0–20.0)

## 2010-08-02 LAB — CBC
HCT: 31.2 % — ABNORMAL LOW (ref 36.0–46.0)
Hemoglobin: 10.7 g/dL — ABNORMAL LOW (ref 12.0–15.0)
MCHC: 34.2 g/dL (ref 30.0–36.0)
MCV: 93.3 fL (ref 78.0–100.0)
Platelets: 145 10*3/uL — ABNORMAL LOW (ref 150–400)
RBC: 3.35 MIL/uL — ABNORMAL LOW (ref 3.87–5.11)
RDW: 19.9 % — ABNORMAL HIGH (ref 11.5–15.5)
WBC: 11.5 K/uL — ABNORMAL HIGH (ref 4.0–10.5)

## 2010-08-02 LAB — BASIC METABOLIC PANEL
BUN: 3 mg/dL — ABNORMAL LOW (ref 6–23)
Calcium: 9.2 mg/dL (ref 8.4–10.5)
Creatinine, Ser: 0.71 mg/dL (ref 0.4–1.2)
GFR calc non Af Amer: >60 mL/min (ref >60)
Glucose, Bld: 106 mg/dL — ABNORMAL HIGH (ref 70–99)

## 2010-08-19 ENCOUNTER — Other Ambulatory Visit: Payer: Self-pay | Admitting: Gastroenterology

## 2010-08-19 LAB — CBC WITH DIFFERENTIAL/PLATELET
Basophils Relative: 1 % (ref 0–1)
Hemoglobin: 11 g/dL — ABNORMAL LOW (ref 12.0–15.0)
MCHC: 31.1 g/dL (ref 30.0–36.0)
Monocytes Relative: 12 % (ref 3–12)
Neutro Abs: 4.6 10*3/uL (ref 1.7–7.7)
Neutrophils Relative %: 49 % (ref 43–77)
Platelets: 262 10*3/uL (ref 150–400)
RBC: 3.93 MIL/uL (ref 3.87–5.11)

## 2010-09-11 NOTE — H&P (Signed)
Cynthia Mclean, Cynthia Mclean                             ACCOUNT NO.:  [id number]   MEDICAL RECORD NO.:  [id number]                   PATIENT TYPE:  INP   LOCATION:  IC03                                 FACILITY:  [facility]   PHYSICIAN:  Corrie Mckusick, M.D.               DATE OF BIRTH:  02/16/1967   DATE OF ADMISSION:  12/03/2002  DATE OF DISCHARGE:                                HISTORY & PHYSICAL   ADMITTING PHYSICIAN:  Dr. Phillips Odor.   PRIMARY PHYSICIAN:  Dr. [name].   ADMISSION DIAGNOSIS:  Recurrent seizures.   HISTORY OF PRESENT ILLNESS:  A 45 year old [place] American female with a  long standing history of seizure disorder who presents with recurrent  seizures.  The patient has had a long standing history of seizures being  dealt with by Dr. Lodema Hong.  The patient has been noncompliant with her  seizure medications per the emergency room interview with the patient and  her boyfriend.  The patient is also somewhat of a heavy alcohol drinker.  She is supposed to be taking Dilantin 300 mg q.p.m. but has not been taking  that.  She has presented to the emergency department two times prior in the  prior 24 hours.  On this episode of admission at 1920, she came in with  seizure x 2 minutes.  In the emergency department, she was found to be  postictal and quite stable.  Her pulse was in the mid 90s, blood pressure  was slightly elevated in the 140s-150s/80-90.  O2 sat was 100% on 2 liters.  It is also noted that she had a strong alcohol smell although, the patient  states she had not drank today.  In the emergency department, the patient  was noted to have a brown-colored emesis that was guaiac positive.  The  patient does dip snuff and it is unclear whether this could cause the guaiac  positive status.  There was an admission back in the Fall 2003 with a quite  similar presentation.   REVIEW OF SYSTEMS:  Through the emergency department was otherwise negative  for cardiovascular,  respiratory, GI and GU symptomatology.   PAST MEDICAL HISTORY:  Seizures.   PAST SURGICAL HISTORY:  None.   MEDICATIONS:  Dilantin presumably at 300 mg q.p.m.   ALLERGIES:  No known drug allergies.   SOCIAL HISTORY:  She is a mother of three children, ages 20-16.  She is  currently unemployed.  She does dip snuff as well as drink alcohol on a  regular basis.  Reportedly in the past, it has been approximately 3-4 beers  daily.   FAMILY HISTORY:  Noncontributory.   PHYSICAL EXAMINATION:  VITAL SIGNS:  In the emergency department, temp is  101.4, pulse 95, respirations 20, blood pressure 142/88, O2 sat 100% on room  air.  GENERAL:  When I saw the patient, she had been sedated with  Ativan as well  as had beginnings of a loading dose of Dilantin.  She was quite somnolent  but was responsive and somewhat irritated.  HEENT:  Nasopharynx and oropharynx are clear with slightly dry mucous  membranes.  NECK:  Supple.  No lymphadenopathy.  There is no JVD appreciated.  CHEST:  Clear to auscultation bilaterally.  CARDIOVASCULAR:  Regular, rate and rhythm.  Normal S1, S2.  No murmurs.  ABDOMEN:  Soft, nontender, nondistended.  There is just some scant mild mid  epigastric pain.  No guarding or rebound however.  NEUROLOGIC:  Pupils equal, reactive to light.  Extraocular muscles intact.  Overall a nonfocal exam.  EXTREMITIES:  No cyanosis, clubbing or edema.  No edema.   LABORATORY DATA:  CBC:  White blood cell count 6.9, hemoglobin 11.2,  hematocrit 33.6, platelets 186,  normal differential.  Sodium 135, potassium  3.8, chloride 102, bicarb 22, glucose 93, BUN 5, creatinine 0.7, calcium  9.2.  Amylase, prior in the day, of 273, lipase of 81.  Dilantin level,  earlier in the day at 1637, was 3.6.  Alcohol level of 50 mg/dL.  Urinalysis  with some ketones and some protein.  Negative nitrates, negative leukocyte  esterase, specific gravity of 1.025.  LFTs were not obtained in the  emergency  department but will be added on.   ASSESSMENT:  A 45 year old female with a known history of seizure disorder  and alcohol abuse who presents with recurrent seizures.  The patient has  been noncompliant with medications.  There is also enzymatic evidence of  pancreatitis.  She has a question of an upper gastrointestinal bleed with  guaiac positive emesis.   PLAN:  1. Admit to the ICU for monitoring as a 2-A telemetry bed is not available.  2. Frequent vitals will be obtained q.2h.  3. Initial loading dose in the emergency department by the physician of     Dilantin was discontinued as she had received a Dilantin loading prior in     the day of one gram.  It was elected to, instead of doing the IV loading     dose at 3 a.m. after admission, do a p.o. Dilantin dose of 300 mg and     repeat this in the morning at 6 a.m.  4. Dilantin level repeated in the morning.  5. We will add on LFTs as discussed above.  6. Keep NPO status for now.  7. Rally pack with banana bag.  8. Benzodiazepine use p.r.n. for any signs of alcohol withdrawal.  9. I strongly encouraged the patient to discontinue her alcohol consumption.  10.      We will also work with the patient on taking her medications more     appropriately.  11.      We will obtain a nutrition consult as there is some question of     lack of good p.o. intake.  12.      We will consult GI due to the hematemesis.  13.      The patient will be followed by Dr. Josefine Class in the morning in lieu     of Dr. Lodema Hong.  14.      We will continue to follow the patient closely.  Corrie Mckusick, M.D.    Flint Melter  D:  12/04/2002  T:  12/04/2002  Job:  [id number]

## 2010-09-11 NOTE — Consult Note (Signed)
Cynthia Mclean, Cynthia Mclean                             ACCOUNT NO.:  [id number]   MEDICAL RECORD NO.:  [id number]                   PATIENT TYPE:  INP   LOCATION:  IC03                                 FACILITY:  [facility]   PHYSICIAN:  Lionel December, M.D.                 DATE OF BIRTH:  02/16/1967   DATE OF CONSULTATION:  12/04/2002  DATE OF DISCHARGE:                                   CONSULTATION   REFERRING PHYSICIAN:  Hanley Hays. Josefine Class, M.D.   REASON FOR CONSULTATION:  Possible hematemesis.   HISTORY OF PRESENT ILLNESS:  The patient is a 45 year old, African-American  female with history of seizure disorder who presented to the emergency  department on two occasions yesterday secondary to seizure.  On the second  occasion, she reported had a grand mal seizure.  The patient vomited in the  emergency department and was Gastroccult positive.  The patient states she  has vomited her snuff.  She denies any history of bright red blood, emesis  or coffee-grounds emesis.  She denies any abdominal pain, heartburn, melena  or rectal bleeding.  She consumes alcohol on the weekends.  She generally  drinks a 40 ounce beer each day.  She denies any NSAID or aspirin use.  Earlier yesterday, her hemoglobin was 13 and hematocrit 38.  On the second  ER visit, her hemoglobin is 11.2 and hematocrit 33.6.  Today, her hemoglobin  is 11.9, hematocrit 34.5 and MCV 97.4.  Her sedimentation rate is 72.  Her  sodium is 135, potassium 3.8, BUN 95, creatinine 0.7, glucose 93.  Total  bilirubin was 1.5, Alk phos 52, SGOT 68, SGPT 50, albumin 3.5, amylase 273,  lipase 81.  ETOH level was 50.  When seen on first ER visit yesterday, her  Phenytoin level was 3.6.  She was given Dilantin in the ED.  On the second  ED visit, Phenytoin read less than 40 and today it is 26.6.  She had trace  blood in her urine along with 15 ketones and 30 protein.  Her lipase is 66  today.   MEDICATIONS PRIOR TO ADMISSION:  Dilantin 100 mg  p.o. q.h.s.   ALLERGIES:  No known drug allergies.   PAST MEDICAL HISTORY:  1. Seizure disorder, question etiology.  2. Possible hypertension.  3. Alcohol use.   FAMILY HISTORY:  Denies any history of chronic GI illnesses or colorectal  cancer.   SOCIAL HISTORY:  She is single and has three children.  She is employed at  [employer].  She consumes alcohol as outlined above.  She dips  snuff.  She does not smoke cigarettes.   REVIEW OF SYMPTOMS:  GASTROINTESTINAL:  See HPI.  GENERAL:  Denies any  weight loss.  CARDIOPULMONARY:  Denies any chest pain or shortness of  breath.  GENITOURINARY:  Denies any dysuria or hematuria.   PHYSICAL EXAMINATION:  VITAL SIGNS:  Weight 156.3 initially, today 149.2.  Height 64 inches.  T-max 101.6, current 98.4, pulse 90, respirations 20,  blood pressure 140/98.  GENERAL:  A pleasant, young, African-American female in no acute distress.  She is drowsy, but is easily arousable.  She is alert and oriented.  SKIN:  Warm and dry.  No jaundice.  HEENT:  Conjunctivae are pink.  Sclerae nonicteric.  Oropharyngeal mucosa  moist and pink.  CHEST:  Lungs clear to auscultation.  CARDIAC:  Regular rate and rhythm with normal S1, S2, no murmurs, rubs or  gallops.  ABDOMEN:  Positive bowel sounds.  Soft, nontender, nondistended.  No  organomegaly or masses.  EXTREMITIES:  No edema.   IMPRESSION:  The patient is a 45 year old female who presented with  recurrent seizures.  Emesis was Gastroccult positive in the emergency room.  It is unclear whether she had true coffee-grounds emesis.  She may have  developed a Mallory-Weiss tear; however, given alcohol use, cannot rule out  alcohol gastritis or peptic ulcer disease.  Her hemoglobin is slightly down  probably due to hydration.  In addition, she has slightly elevated amylase  and lipase, but clinically does not appear to have pancreatitis.   RECOMMENDATIONS:  1. EGD today.  2. Protonix 40 mg IV  q.24h.  3. Further recommendations to follow.   I would like to thank Dr. Josefine Class for allowing [place] to take part in the care  of this patient.     Tana Coast, P.A.                        Lionel December, M.D.    LL/MEDQ  D:  12/04/2002  T:  12/04/2002  Job:  [id number]

## 2010-09-11 NOTE — H&P (Signed)
Cynthia Mclean, Cynthia Mclean                             ACCOUNT NO.:  [id number]   MEDICAL RECORD NO.:  [id number]                   PATIENT TYPE:  OBV   LOCATION:  A211                                 FACILITY:  [facility]   PHYSICIAN:  Milus Mallick. Lodema Hong, M.D.           DATE OF BIRTH:  02/16/1967   DATE OF ADMISSION:  01/17/2002  DATE OF DISCHARGE:                                HISTORY & PHYSICAL   CHIEF COMPLAINT:  The patient is a 45 year old African-American female with  a seizure disorder during which she has episodes of loss of consciousness  not accompanied by jerking; however, accompanied by incontinence.  On the  day of admission she had such an episode when she got up from sitting on a  bench with friends.  She recalls hitting a tree and then passed out.  She  was brought to the emergency room where she was found not only to have a  subtherapeutic level of Dilantin which she has not taken for over a week;  she also was found to have significant swelling over the left orbit which  was increasing in size, and intractable vomiting.  The patient denies any  localized weakness or numbness.  She states she has been admitted to the  hospital in the remote past for her seizures.   PAST MEDICAL HISTORY:  Significant for seizures and question of a history of  hypertension.   MEDICATIONS:  Dilantin dosage unknown.   PRIMARY CARE PHYSICIAN:  Dr. [name]; however, she has not seen her  for over six to nine months secondary to lack of insurance.   ALLERGIES:  No known drug allergies.   SOCIAL HISTORY:  She is the mother of three children ages 69 to 56.  She does  work in a [employer].  She does dip snuff as well as drink alcohol on a  regular basis, as much as three beers per night.   PHYSICAL EXAMINATION ON ADMISSION:  GENERAL:  The patient is alert and  oriented x4 in no obvious cardiopulmonary distress.  VITAL SIGNS:  As charted, temperature 100, heart rate 94, respirations 20,  blood pressure 140/100.  Pulse oximetry on room air is 94%.  HEENT:  Significant for hematoma, maximal diameter of 6 cm over the left  orbit.  She also had an abrasion in this area with fresh blood visible.  There was no facial asymmetry.  Her extraocular muscles are intact.  Oropharynx was moist.  She has snuff in her mouth and poor dentition.  Neck  supple, no JVD, no adenopathy.  Cranial nerves II-XII are intact.  CHEST:  Clear to auscultation throughout.  CARDIOVASCULAR:  Heart sounds 1 and 2 heard.  No murmurs, no S3.  ABDOMEN:  Soft, nontender.  No organomegaly or masses.  Bowel sounds present  and normal.  RECTAL:  Not done.  EXTREMITIES:  Negative for edema or ulcers.  LABORATORY DATA:  Urine microscopic showed many bacteria; culture and  sensitivity will be sent.  She will be started on Levaquin 500 mg daily.  Her drug screen is positive for cocaine.  Liver function showed an SGOT  elevated at 50 with a normal upper range of 37; otherwise, SGPT normal at  32, total protein 7.6, total bilirubin 0.6.  Chemistry:  Sodium low at 131,  potassium 3.6, chloride 99, CO2 23, BUN 1, glucose 137, creatinine 0.7,  calcium 9.2.  CBC and diff:  White cell count 12 with a hemoglobin of 11.8  and platelet count 247, neutrophils at 84%.  Dilantin level markedly  subtherapeutic at 1.5.   Head CT scan was done and per the ED physician there was no sign of any  brain trauma; the official report is not available at the time of this  dictation.   ASSESSMENT:  A 45 year old African-American female with a history of seizure  disorder who had a seizure on the day of her admission, sustained marked  head trauma, and presents with a left supraorbital hematoma and abrasion.  She is also positive for cocaine abuse and is a known abuser of alcohol and  tobacco.  She will be admitted on observation status and neuro checks q.2h.  while awake.  She will have a formal neurology consult to be done as we   requested.  She is to be on fall precautions with out of bed with assistance  only.  Urine will be sent for culture and sensitivities.  She will be  started on Levaquin to cover a presumed UTI as well as to protect against  any possible skin infection from this laceration.  Tetanus toxoid is given  in the emergency room.  The wound has been cleaned and has a dressing  applied and she is to have a cold compress to the site of the hematoma.  Blood pressure will be monitored while she is in the hospital to determine  whether or not she requires antihypertensive medications.  She has received  a loading dose of Cerebyx  1 g IV in the ED and will receive a further 300 mg of Dilantin tonight with  blood level to be drawn in the morning.  Tylenol is written as needed for  headache.  She is on IV fluids at 125 cc/hour with IV Phenergan as needed  for nausea.  Her diet tonight is liquids and she will go on a regular diet  as tolerated in the morning.                                                Milus Mallick. Lodema Hong, M.D.    MES/MEDQ  D:  01/17/2002  T:  01/17/2002  Job:  [id number]

## 2010-09-11 NOTE — Consult Note (Signed)
   NAME:  Cynthia Mclean, Cynthia Mclean                             ACCOUNT NO.:  [id number]   MEDICAL RECORD NO.:  [id number]                   PATIENT TYPE:  INP   LOCATION:  IC03                                 FACILITY:  [facility]   PHYSICIAN:  Lionel December, M.D.                 DATE OF BIRTH:  02/16/1967   DATE OF CONSULTATION:  DATE OF DISCHARGE:                                   CONSULTATION   ADDENDUM:  After discussion with Dr. Julio Sicks, we have decided to hold on  upper endoscopy at this time.  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  Will follow the patient as needed.  Recommend Protonix 40 mg daily, IV or oral.     Tana Coast, P.A.                        Lionel December, M.D.    LL/MEDQ  D:  12/04/2002  T:  12/04/2002  Job:  [id number]   cc:   Hanley Hays. Dechurch, M.D.  [address]  [place]  [place] [address]  Fax: [phone]

## 2010-09-11 NOTE — Discharge Summary (Signed)
   Cynthia Mclean, Cynthia Mclean                             ACCOUNT NO.:  [id number]   MEDICAL RECORD NO.:  [id number]                   PATIENT TYPE:  INP   LOCATION:  IC03                                 FACILITY:  [facility]   PHYSICIAN:  Hanley Hays. Dechurch, M.D.           DATE OF BIRTH:  02/16/1967   DATE OF ADMISSION:  12/03/2002  DATE OF DISCHARGE:  12/05/2002                                 DISCHARGE SUMMARY   DISPOSITION:  Patient left against medical advice.   HOSPITAL COURSE:  A 45 year old [place] American female who apparently is  followed by Dr. [name] occasionally, with a history of seizures  who apparently had not been taking her Dilantin and had been drinking.  She  presented to the emergency room in seizures and postictal.  She had nausea  and vomiting of coffee-ground material.  Hemodynamically she remained  stable.  Pancreatic enzymes were elevated consistent with alcoholic  pancreatitis.  It was felt that she most likely had alcohol induced  gastritis as well.  She was seen in consultation by gastroenterology who  recommended an EGD given the fact she was hemodynamically stable and there  would be no indication for workup at this point as she was tolerating a full  diet.  ACT team consultation was obtained.  The patient was being evaluated  when she refused detox as an outpatient and insisted on discharge.  She had  been loaded with Dilantin initially and then was started on Ativan on the  following day.  However, she became adamant that she was going to leave the  hospital and took out her own IV as well as disconnected herself from any  cables and left the premises, being discharged against medical advice.                                               Hanley Hays Josefine Class, M.D.    FED/MEDQ  D:  12/18/2002  T:  12/18/2002  Job:  [id number]

## 2010-11-18 ENCOUNTER — Encounter: Payer: Medicaid Other | Admitting: Family Medicine

## 2010-12-20 ENCOUNTER — Other Ambulatory Visit: Payer: Self-pay | Admitting: Family Medicine

## 2010-12-21 ENCOUNTER — Other Ambulatory Visit: Payer: Self-pay | Admitting: Family Medicine

## 2011-01-01 ENCOUNTER — Encounter: Payer: Self-pay | Admitting: Family Medicine

## 2011-01-04 ENCOUNTER — Encounter: Payer: Medicaid Other | Admitting: Family Medicine

## 2011-01-06 ENCOUNTER — Encounter: Payer: Self-pay | Admitting: Family Medicine

## 2011-01-07 ENCOUNTER — Other Ambulatory Visit (HOSPITAL_COMMUNITY)
Admission: RE | Admit: 2011-01-07 | Discharge: 2011-01-07 | Disposition: A | Discharge status: Home / Self Care | Payer: Medicaid Other | Source: Ambulatory Visit | Attending: Family Medicine | Admitting: Family Medicine

## 2011-01-07 ENCOUNTER — Ambulatory Visit (INDEPENDENT_AMBULATORY_CARE_PROVIDER_SITE_OTHER): Payer: Medicaid Other | Admitting: Family Medicine

## 2011-01-07 ENCOUNTER — Encounter: Payer: Self-pay | Admitting: Family Medicine

## 2011-01-07 VITALS — BP 120/70 | HR 77 | Resp 16 | Ht 65.5 in | Wt 160.0 lb

## 2011-01-07 DIAGNOSIS — R569 Unspecified convulsions: Secondary | ICD-10-CM

## 2011-01-07 DIAGNOSIS — Z01419 Encounter for gynecological examination (general) (routine) without abnormal findings: Secondary | ICD-10-CM | POA: Insufficient documentation

## 2011-01-07 DIAGNOSIS — Z124 Encounter for screening for malignant neoplasm of cervix: Secondary | ICD-10-CM

## 2011-01-07 DIAGNOSIS — Z13220 Encounter for screening for lipoid disorders: Secondary | ICD-10-CM

## 2011-01-07 DIAGNOSIS — D509 Iron deficiency anemia, unspecified: Secondary | ICD-10-CM

## 2011-01-07 DIAGNOSIS — I10 Essential (primary) hypertension: Secondary | ICD-10-CM

## 2011-01-07 DIAGNOSIS — Z0000 Encounter for general adult medical examination without abnormal findings: Secondary | ICD-10-CM

## 2011-01-07 DIAGNOSIS — R5381 Other malaise: Secondary | ICD-10-CM

## 2011-01-07 DIAGNOSIS — Z1211 Encounter for screening for malignant neoplasm of colon: Secondary | ICD-10-CM

## 2011-01-07 DIAGNOSIS — R5383 Other fatigue: Secondary | ICD-10-CM

## 2011-01-07 LAB — POC HEMOCCULT BLD/STL (OFFICE/1-CARD/DIAGNOSTIC): Fecal Occult Blood, POC: NEGATIVE

## 2011-01-07 NOTE — Patient Instructions (Addendum)
F/u in February.  Pls stop dipping snuff, I am glad that you have cut down.  Fasting labs in February before next visit

## 2011-01-07 NOTE — Progress Notes (Signed)
  Subjective:    Patient ID: Cynthia Mclean, female    DOB: 06-02-1965, 45 y.o.   MRN: [id number]  HPI The PT is here for annual exam. Preventive health is updated, specifically  Cancer screening and Immunization.   The PT denies any adverse reactions to current medications since the last visit.  There are no new concerns.  There are no specific complaints       Review of Systems Denies recent fever or chills. Denies sinus pressure, nasal congestion, ear pain or sore throat. Denies chest congestion, productive cough or wheezing. Denies chest pains, palpitations and leg swelling Denies abdominal pain, nausea, vomiting,diarrhea or constipation.   Denies dysuria, frequency, hesitancy or incontinence. Denies joint pain, swelling and limitation in mobility. Denies headaches, seizures, numbness, or tingling. Denies depression, anxiety or insomnia. Denies skin break down or rash.        Objective:   Physical Exam Pleasant well nourished female, alert and oriented x 3, in no cardio-pulmonary distress. Afebrile. HEENT No facial trauma or asymetry. Sinuses non tender.  EOMI, PERTL, fundoscopic exam is normal, no hemorhage or exudate.  External ears normal, tympanic membranes clear. Oropharynx moist, no exudate, good dentition. Neck: supple, no adenopathy,JVD or thyromegaly.No bruits.  Chest: Clear to ascultation bilaterally.No crackles or wheezes. Non tender to palpation  Breast: No asymetry,no masses. No nipple discharge or inversion. No axillary or supraclavicular adenopathy  Cardiovascular system; Heart sounds normal,  S1 and  S2 ,no S3.  No murmur, or thrill. Apical beat not displaced Peripheral pulses normal.  Abdomen: Soft, non tender, no organomegaly or masses. No bruits. Bowel sounds normal. No guarding, tenderness or rebound.  Rectal:  No mass. Guaiac negative stool.  GU: External genitalia normal. No lesions. Vaginal canal normal.No discharge. Uterus  normal size, no adnexal masses, no cervical motion or adnexal tenderness.  Musculoskeletal exam: Full ROM of spine, hips , shoulders and knees. No deformity ,swelling or crepitus noted. No muscle wasting or atrophy.   Neurologic: Cranial nerves 2 to 12 intact. Power, tone ,sensation and reflexes normal throughout. No disturbance in gait. No tremor.  Skin: Intact, no ulceration, erythema , scaling or rash noted. Pigmentation normal throughout  Psych; Normal mood and affect. Judgement and concentration normal        Assessment & Plan:

## 2011-01-13 ENCOUNTER — Telehealth: Payer: Self-pay | Admitting: *Deleted

## 2011-01-13 ENCOUNTER — Encounter: Payer: Self-pay | Admitting: *Deleted

## 2011-01-13 NOTE — Telephone Encounter (Signed)
Called patient, no answer, mailed letter

## 2011-01-13 NOTE — Telephone Encounter (Signed)
Message copied by Diamantina Monks on Wed Jan 13, 2011  9:51 AM ------      Message from: [name] MD E      Created: Mon Jan 11, 2011  5:11 PM       Please advise the patient that pap is normal.

## 2011-02-10 LAB — CULTURE, ROUTINE-ABSCESS
Culture: NO GROWTH
Gram Stain: NONE SEEN

## 2011-04-16 ENCOUNTER — Other Ambulatory Visit: Payer: Self-pay | Admitting: Family Medicine

## 2011-05-20 ENCOUNTER — Other Ambulatory Visit: Payer: Self-pay | Admitting: Family Medicine

## 2011-05-20 DIAGNOSIS — Z139 Encounter for screening, unspecified: Secondary | ICD-10-CM

## 2011-06-07 ENCOUNTER — Ambulatory Visit (HOSPITAL_COMMUNITY)
Admission: RE | Admit: 2011-06-07 | Discharge: 2011-06-07 | Disposition: A | Discharge status: Home / Self Care | Payer: Medicaid Other | Source: Ambulatory Visit | Attending: Family Medicine | Admitting: Family Medicine

## 2011-06-07 DIAGNOSIS — Z139 Encounter for screening, unspecified: Secondary | ICD-10-CM

## 2011-06-07 DIAGNOSIS — Z1231 Encounter for screening mammogram for malignant neoplasm of breast: Secondary | ICD-10-CM | POA: Insufficient documentation

## 2011-06-09 ENCOUNTER — Ambulatory Visit: Payer: Medicaid Other | Admitting: Family Medicine

## 2011-06-14 ENCOUNTER — Other Ambulatory Visit: Payer: Self-pay | Admitting: Family Medicine

## 2011-06-14 ENCOUNTER — Encounter: Payer: Self-pay | Admitting: Family Medicine

## 2011-06-14 ENCOUNTER — Ambulatory Visit (INDEPENDENT_AMBULATORY_CARE_PROVIDER_SITE_OTHER): Payer: Medicaid Other | Admitting: Family Medicine

## 2011-06-14 VITALS — BP 138/70 | HR 76 | Resp 18 | Ht 65.5 in | Wt 172.1 lb

## 2011-06-14 DIAGNOSIS — I10 Essential (primary) hypertension: Secondary | ICD-10-CM

## 2011-06-14 DIAGNOSIS — R569 Unspecified convulsions: Secondary | ICD-10-CM

## 2011-06-14 MED ORDER — BENAZEPRIL HCL 10 MG PO TABS: 10.0000 mg | ORAL_TABLET | Freq: Every day | ORAL | Status: DC

## 2011-06-14 MED ORDER — PHENYTOIN SODIUM EXTENDED 300 MG PO CAPS: 300.0000 mg | ORAL_CAPSULE | Freq: Two times a day (BID) | ORAL | Status: DC

## 2011-06-14 NOTE — Progress Notes (Signed)
  Subjective:    Patient ID: Cynthia Mclean, female    DOB: 21-Aug-1965, 46 y.o.   MRN: [id number]  HPI The PT is here for follow up and re-evaluation of chronic medical conditions, medication management and review of any available recent lab and radiology data.  Preventive health is updated, specifically  Cancer screening and Immunization.   Questions or concerns regarding consultations or procedures which the PT has had in the interim are  addressed. The PT denies any adverse reactions to current medications since the last visit.  There are no new concerns. No recent seizure activity There are no specific complaints       Review of Systems See HPI Denies recent fever or chills. Denies sinus pressure, nasal congestion, ear pain or sore throat. Denies chest congestion, productive cough or wheezing. Denies chest pains, palpitations and leg swelling Denies abdominal pain, nausea, vomiting,diarrhea or constipation.   Denies dysuria, frequency, hesitancy or incontinence. Denies joint pain, swelling and limitation in mobility. Denies headaches, seizures, numbness, or tingling. Denies depression, anxiety or insomnia. Denies skin break down or rash.        Objective:   Physical Exam  Patient alert and oriented and in no cardiopulmonary distress.  HEENT: No facial asymmetry, EOMI, no sinus tenderness,  oropharynx pink and moist.  Neck supple no adenopathy.  Chest: Clear to auscultation bilaterally.  CVS: S1, S2 no murmurs, no S3.  ABD: Soft non tender. Bowel sounds normal.  Ext: No edema  MS: Adequate ROM spine, shoulders, hips and knees.  Skin: Intact, no ulcerations or rash noted.  Psych: Good eye contact, normal affect. Memory intact not anxious or depressed appearing.  CNS: CN 2-12 intact, power, tone and sensation normal throughout.      Assessment & Plan:

## 2011-06-14 NOTE — Patient Instructions (Signed)
cPE end September  Lab today, cbc, chem 7, hepatic and dilatin and tsh, vit D today also random cholesterol.  It is important that you exercise regularly at least 30 minutes 5 times a week. If you develop chest pain, have severe difficulty breathing, or feel very tired, stop exercising immediately and seek medical attention    You need to stop dipping snuff

## 2011-06-14 NOTE — Assessment & Plan Note (Signed)
Controlled, no change in medication  

## 2011-06-14 NOTE — Assessment & Plan Note (Signed)
Reports no recent seizure activity, labs to be checked today

## 2011-06-15 ENCOUNTER — Other Ambulatory Visit: Payer: Self-pay | Admitting: Family Medicine

## 2011-06-15 LAB — BASIC METABOLIC PANEL WITH GFR
BUN: 5 mg/dL — ABNORMAL LOW (ref 6–23)
CO2: 27 mEq/L (ref 19–32)
Calcium: 9.6 mg/dL (ref 8.4–10.5)
Chloride: 100 mEq/L (ref 96–112)
Creat: 0.58 mg/dL (ref 0.50–1.10)
Glucose, Bld: 83 mg/dL (ref 70–99)

## 2011-06-15 LAB — HEPATIC FUNCTION PANEL
ALT: 18 U/L (ref 0–35)
AST: 32 U/L (ref 0–37)
Albumin: 3.9 g/dL (ref 3.5–5.2)
Alkaline Phosphatase: 77 U/L (ref 39–117)
Total Bilirubin: 0.4 mg/dL (ref 0.3–1.2)
Total Protein: 7.5 g/dL (ref 6.0–8.3)

## 2011-06-15 LAB — CBC
HCT: 38 % (ref 36.0–46.0)
Hemoglobin: 12.1 g/dL (ref 12.0–15.0)
MCH: 31.6 pg (ref 26.0–34.0)
MCHC: 31.8 g/dL (ref 30.0–36.0)
MCV: 99.2 fL (ref 78.0–100.0)
RBC: 3.83 MIL/uL — ABNORMAL LOW (ref 3.87–5.11)

## 2011-06-15 LAB — TSH: TSH: 2.268 u[IU]/mL (ref 0.350–4.500)

## 2011-06-15 MED ORDER — VITAMIN D3 1.25 MG (50000 UT) PO CAPS: 50000.0000 [IU] | ORAL_CAPSULE | ORAL | Status: DC

## 2011-10-08 ENCOUNTER — Other Ambulatory Visit: Payer: Self-pay | Admitting: Family Medicine

## 2011-12-02 ENCOUNTER — Encounter (HOSPITAL_COMMUNITY): Payer: Self-pay | Admitting: *Deleted

## 2011-12-02 ENCOUNTER — Emergency Department (HOSPITAL_COMMUNITY)
Admission: EM | Admit: 2011-12-02 | Discharge: 2011-12-02 | Disposition: A | Discharge status: Home / Self Care | Payer: Medicaid Other | Attending: Emergency Medicine | Admitting: Emergency Medicine

## 2011-12-02 DIAGNOSIS — G40909 Epilepsy, unspecified, not intractable, without status epilepticus: Secondary | ICD-10-CM | POA: Insufficient documentation

## 2011-12-02 DIAGNOSIS — N390 Urinary tract infection, site not specified: Secondary | ICD-10-CM

## 2011-12-02 DIAGNOSIS — S301XXA Contusion of abdominal wall, initial encounter: Secondary | ICD-10-CM | POA: Insufficient documentation

## 2011-12-02 DIAGNOSIS — I10 Essential (primary) hypertension: Secondary | ICD-10-CM | POA: Insufficient documentation

## 2011-12-02 DIAGNOSIS — D509 Iron deficiency anemia, unspecified: Secondary | ICD-10-CM | POA: Insufficient documentation

## 2011-12-02 DIAGNOSIS — X58XXXA Exposure to other specified factors, initial encounter: Secondary | ICD-10-CM | POA: Insufficient documentation

## 2011-12-02 LAB — URINALYSIS, ROUTINE W REFLEX MICROSCOPIC
Glucose, UA: NEGATIVE mg/dL
Ketones, ur: NEGATIVE mg/dL
Protein, ur: NEGATIVE mg/dL
pH: 5.5 (ref 5.0–8.0)

## 2011-12-02 LAB — URINE MICROSCOPIC-ADD ON

## 2011-12-02 MED ORDER — HYDROCODONE-ACETAMINOPHEN 5-325 MG PO TABS: 1.0000 | ORAL_TABLET | ORAL | Status: AC | PRN

## 2011-12-02 MED ORDER — METHOCARBAMOL 500 MG PO TABS: 500.0000 mg | ORAL_TABLET | Freq: Three times a day (TID) | ORAL | Status: AC

## 2011-12-02 MED ORDER — CEPHALEXIN 500 MG PO CAPS: 500.0000 mg | ORAL_CAPSULE | Freq: Four times a day (QID) | ORAL | Status: AC

## 2011-12-02 NOTE — ED Notes (Signed)
Pt with right side pain since Sunday, thinks she may have pulled a muscle

## 2011-12-02 NOTE — ED Provider Notes (Signed)
History     CSN: [id number]  Arrival date & time 12/02/11  1354   First MD Initiated Contact with Patient 12/02/11 1605      Chief Complaint  Patient presents with  . Flank Pain    (Consider location/radiation/quality/duration/timing/severity/associated sxs/prior treatment) HPI Comments: Pt fell from a bed onto a box fan on Aug 3. No LoC. Pt denies hemoptysis or hematuria since the fall. Pain only with palpation over the spot and with certain movement. No continuous pain.  Patient is a 46 y.o. female presenting with flank pain. The history is provided by the patient.  Flank Pain Associated symptoms include arthralgias. Pertinent negatives include no abdominal pain, chest pain, coughing or neck pain.    Past Medical History  Diagnosis Date  . Seizure disorder   . IDA (iron deficiency anemia)     required blood tranfusion  . Hypertension   . Nicotine addiction   . Alcohol dependency     Past Surgical History  Procedure Date  . Btl   . Left knee surgery, fracture     Family History  Problem Relation Age of Onset  . Anxiety disorder Sister     History  Substance Use Topics  . Smoking status: Never Smoker   . Smokeless tobacco: Current User    Types: Snuff  . Alcohol Use: Yes     Drink on weekends and on Wednesday's share a 40 ounce beer with 2 people     OB History    Grav Para Term Preterm Abortions TAB SAB Ect Mult Living                  Review of Systems  Constitutional: Negative for activity change.       All ROS Neg except as noted in HPI  HENT: Negative for nosebleeds and neck pain.   Eyes: Negative for photophobia and discharge.  Respiratory: Negative for cough, shortness of breath and wheezing.   Cardiovascular: Negative for chest pain and palpitations.  Gastrointestinal: Negative for abdominal pain and blood in stool.  Genitourinary: Positive for flank pain. Negative for dysuria, frequency and hematuria.  Musculoskeletal: Positive for  arthralgias. Negative for back pain.  Skin: Negative.   Neurological: Positive for seizures. Negative for dizziness and speech difficulty.  Psychiatric/Behavioral: Negative for hallucinations and confusion.    Allergies  Review of patient's allergies indicates no known allergies.  Home Medications   Current Outpatient Rx  Name Route Sig Dispense Refill  . BENAZEPRIL HCL 10 MG PO TABS  TAKE 1 TABLET BY MOUTH EVERY DAY 30 tablet 3  . VITAMIN D3 50000 UNITS PO CAPS Oral Take 50,000 Units by mouth once a week. 12 capsule 1  . FERROUS SULFATE 325 (65 FE) MG PO TABS Oral Take 325 mg by mouth daily.    Marland Kitchen PHENYTOIN SODIUM EXTENDED 300 MG PO CAPS Oral Take 1 capsule (300 mg total) by mouth 2 (two) times daily. 60 capsule 3    BP 159/90  Pulse 81  Temp 98.4 F (36.9 C) (Oral)  Resp 20  Ht 5\' 6"  (1.676 m)  Wt 155 lb (70.308 kg)  BMI 25.02 kg/m2  SpO2 100%  LMP 12/01/2011  Physical Exam  Nursing note and vitals reviewed. Constitutional: She is oriented to person, place, and time. She appears well-developed and well-nourished.  Non-toxic appearance.  HENT:  Head: Normocephalic.  Right Ear: Tympanic membrane and external ear normal.  Left Ear: Tympanic membrane and external ear normal.  Eyes: EOM and  lids are normal. Pupils are equal, round, and reactive to light.  Neck: Normal range of motion. Neck supple. Carotid bruit is not present.  Cardiovascular: Normal rate, regular rhythm, normal heart sounds, intact distal pulses and normal pulses.   Pulmonary/Chest: Breath sounds normal. No respiratory distress.  Abdominal: Soft. Bowel sounds are normal. There is tenderness. There is no guarding.       Right cvat. No noted bruise to th right flank.  Musculoskeletal: Normal range of motion.  Lymphadenopathy:       Head (right side): No submandibular adenopathy present.       Head (left side): No submandibular adenopathy present.    She has no cervical adenopathy.  Neurological: She is  alert and oriented to person, place, and time. She has normal strength. No cranial nerve deficit or sensory deficit.  Skin: Skin is warm and dry.  Psychiatric: She has a normal mood and affect. Her speech is normal.    ED Course  Procedures (including critical care time)   Labs Reviewed  URINALYSIS, ROUTINE W REFLEX MICROSCOPIC   No results found.   No diagnosis found.    MDM  I have reviewed nursing notes, vital signs, and all appropriate lab and imaging results for this patient. Pt sustained a fall from a bed onto a box fan on Aug 3. She has had an area or soreness on the right flank since that time. No cough, no hemoptosis.  No pain with walking or cough. Pain to palpation and certain positiions  of one spot in the right flank.  Vital signs wnl. Urine culture sent to lab. Rx for Keflex, Robaxin and Norco given to the patient.       Kathie Dike, [place] 12/21/11 1544

## 2011-12-03 NOTE — ED Provider Notes (Addendum)
History     CSN: [id number]  Arrival date & time 12/02/11  1354   First MD Initiated Contact with Patient 12/02/11 1605      Chief Complaint  Patient presents with  . Flank Pain    (Consider location/radiation/quality/duration/timing/severity/associated sxs/prior treatment) HPI  Past Medical History  Diagnosis Date  . Seizure disorder   . IDA (iron deficiency anemia)     required blood tranfusion  . Hypertension   . Nicotine addiction   . Alcohol dependency     Past Surgical History  Procedure Date  . Btl   . Left knee surgery, fracture     Family History  Problem Relation Age of Onset  . Anxiety disorder Sister     History  Substance Use Topics  . Smoking status: Never Smoker   . Smokeless tobacco: Current User    Types: Snuff  . Alcohol Use: Yes     Drink on weekends and on Wednesday's share a 40 ounce beer with 2 people     OB History    Grav Para Term Preterm Abortions TAB SAB Ect Mult Living                  Review of Systems  Allergies  Review of patient's allergies indicates no known allergies.  Home Medications   Current Outpatient Rx  Name Route Sig Dispense Refill  . BENAZEPRIL HCL 10 MG PO TABS  TAKE 1 TABLET BY MOUTH EVERY DAY 30 tablet 3  . VITAMIN D3 50000 UNITS PO CAPS Oral Take 50,000 Units by mouth once a week. 12 capsule 1  . FERROUS SULFATE 325 (65 FE) MG PO TABS Oral Take 325 mg by mouth daily.    Marland Kitchen PHENYTOIN SODIUM EXTENDED 300 MG PO CAPS Oral Take 1 capsule (300 mg total) by mouth 2 (two) times daily. 60 capsule 3  . CEPHALEXIN 500 MG PO CAPS Oral Take 1 capsule (500 mg total) by mouth 4 (four) times daily. 20 capsule 0  . HYDROCODONE-ACETAMINOPHEN 5-325 MG PO TABS Oral Take 1 tablet by mouth every 4 (four) hours as needed for pain. 15 tablet 0  . METHOCARBAMOL 500 MG PO TABS Oral Take 1 tablet (500 mg total) by mouth 3 (three) times daily. 21 tablet 0    BP 159/90  Pulse 81  Temp 98.4 F (36.9 C) (Oral)  Resp 20  Ht 5'  6" (1.676 m)  Wt 155 lb (70.308 kg)  BMI 25.02 kg/m2  SpO2 100%  LMP 12/01/2011  Physical Exam  ED Course  Procedures (including critical care time)  Labs Reviewed  URINALYSIS, ROUTINE W REFLEX MICROSCOPIC - Abnormal; Notable for the following:    Color, Urine AMBER (*)  BIOCHEMICALS MAY BE AFFECTED BY COLOR   Specific Gravity, Urine >1.030 (*)     Hgb urine dipstick LARGE (*)     Bilirubin Urine SMALL (*)     Nitrite POSITIVE (*)     Leukocytes, UA TRACE (*)     All other components within normal limits  URINE MICROSCOPIC-ADD ON - Abnormal; Notable for the following:    Squamous Epithelial / LPF MANY (*)     Bacteria, UA MANY (*)     All other components within normal limits  URINE CULTURE   No results found.   1. Contusion, flank   2. UTI (lower urinary tract infection)       MDM  Medical screening examination/treatment/procedure(s) were performed by non-physician practitioner and as supervising physician I  was immediately available for consultation/collaboration.        Donnetta Hutching, MD 12/03/11 2130  Donnetta Hutching, MD 12/03/11 [phone]

## 2011-12-04 LAB — URINE CULTURE

## 2011-12-05 NOTE — ED Notes (Signed)
+   urine culture. Treated with Keflex, sensitive to same. 

## 2011-12-14 ENCOUNTER — Other Ambulatory Visit: Payer: Self-pay

## 2011-12-14 MED ORDER — BENAZEPRIL HCL 10 MG PO TABS: 10.0000 mg | ORAL_TABLET | Freq: Every day | ORAL | Status: DC

## 2011-12-21 NOTE — ED Provider Notes (Signed)
Medical screening examination/treatment/procedure(s) were performed by non-physician practitioner and as supervising physician I was immediately available for consultation/collaboration.  Donnetta Hutching, MD 12/21/11 [phone]

## 2012-01-18 ENCOUNTER — Ambulatory Visit (INDEPENDENT_AMBULATORY_CARE_PROVIDER_SITE_OTHER): Payer: Medicaid Other | Admitting: Family Medicine

## 2012-01-18 ENCOUNTER — Encounter: Payer: Self-pay | Admitting: Family Medicine

## 2012-01-18 ENCOUNTER — Other Ambulatory Visit (HOSPITAL_COMMUNITY)
Admission: RE | Admit: 2012-01-18 | Discharge: 2012-01-18 | Disposition: A | Discharge status: Home / Self Care | Payer: Medicaid Other | Source: Ambulatory Visit | Attending: Family Medicine | Admitting: Family Medicine

## 2012-01-18 VITALS — BP 138/82 | HR 62 | Resp 15 | Ht 65.5 in | Wt 160.8 lb

## 2012-01-18 DIAGNOSIS — Z0000 Encounter for general adult medical examination without abnormal findings: Secondary | ICD-10-CM

## 2012-01-18 DIAGNOSIS — Z01419 Encounter for gynecological examination (general) (routine) without abnormal findings: Secondary | ICD-10-CM | POA: Insufficient documentation

## 2012-01-18 LAB — POC HEMOCCULT BLD/STL (OFFICE/1-CARD/DIAGNOSTIC): Fecal Occult Blood, POC: NEGATIVE

## 2012-01-18 NOTE — Progress Notes (Signed)
  Subjective:    Patient ID: Cynthia Mclean, female    DOB: 1965/10/28, 46 y.o.   MRN: [id number]  HPI Pt in for pelvic and breast exam. Refuses flu vaccine. Has no concerns. Reports no recent seizure activity. Denies med intolerance   Review of Systems See HPI Denies recent fever or chills. Denies sinus pressure, nasal congestion, ear pain or sore throat. Denies chest congestion, productive cough or wheezing. Denies chest pains, palpitations and leg swelling Denies abdominal pain, nausea, vomiting,diarrhea or constipation.   Denies dysuria, frequency, hesitancy or incontinence. Denies joint pain, swelling and limitation in mobility. Denies headaches, seizures, numbness, or tingling. Denies depression, anxiety or insomnia. Denies skin break down or rash.        Objective:   Physical Exam  Pleasant well nourished female, alert and oriented x 3, in no cardio-pulmonary distress. Afebrile. HEENT No facial trauma or asymetry. Sinuses non tender.  EOMI, PERTL, fundoscopic exam is normal, no hemorhage or exudate.  External ears normal, tympanic membranes clear. Oropharynx moist, no exudate, poor dentition. Neck: supple, no adenopathy,JVD or thyromegaly.No bruits.  Chest: Clear to ascultation bilaterally.No crackles or wheezes. Non tender to palpation  Breast: No asymetry,no masses. No nipple discharge or inversion. No axillary or supraclavicular adenopathy  Cardiovascular system; Heart sounds normal,  S1 and  S2 ,no S3.  No murmur, or thrill. Apical beat not displaced Peripheral pulses normal.  Abdomen: Soft, non tender, no organomegaly or masses. No bruits. Bowel sounds normal. No guarding, tenderness or rebound.  Rectal:  No mass. Guaiac negative stool.  GU: External genitalia normal. No lesions. Vaginal canal normal.No discharge. Uterus normal size, no adnexal masses, no cervical motion or adnexal tenderness.  Musculoskeletal exam: Full ROM of spine, hips ,  shoulders and knees. No deformity ,swelling or crepitus noted. No muscle wasting or atrophy.   Neurologic: Cranial nerves 2 to 12 intact. Power, tone ,sensation and reflexes normal throughout. No disturbance in gait. No tremor.  Skin: Intact, no ulceration, erythema , scaling or rash noted. Pigmentation normal throughout  Psych; Normal mood and affect. Judgement and concentration normal       Assessment & Plan:

## 2012-01-18 NOTE — Patient Instructions (Addendum)
F/u in 6 month  Please call if you need me before.  No changes in medication.  You need to stop snuff   Flu vaccine recommended, you need this, call if you change your mind please, you also need TDAP

## 2012-01-30 DIAGNOSIS — Z0000 Encounter for general adult medical examination without abnormal findings: Secondary | ICD-10-CM | POA: Insufficient documentation

## 2012-01-30 DIAGNOSIS — Z7689 Persons encountering health services in other specified circumstances: Secondary | ICD-10-CM | POA: Insufficient documentation

## 2012-01-30 NOTE — Assessment & Plan Note (Signed)
Pelvic and breast exam performed and is documented . Pt has poor oral hygiene. She has ongoing nicotine use in the form of snuff refuses to quit at this time, she is re educated about the need to do so.

## 2012-02-26 ENCOUNTER — Emergency Department (HOSPITAL_COMMUNITY): Payer: Medicaid Other

## 2012-02-26 ENCOUNTER — Encounter (HOSPITAL_COMMUNITY): Payer: Self-pay | Admitting: Emergency Medicine

## 2012-02-26 ENCOUNTER — Emergency Department (HOSPITAL_COMMUNITY)
Admission: EM | Admit: 2012-02-26 | Discharge: 2012-02-26 | Disposition: A | Discharge status: Home / Self Care | Payer: Medicaid Other | Attending: Emergency Medicine | Admitting: Emergency Medicine

## 2012-02-26 DIAGNOSIS — Z79899 Other long term (current) drug therapy: Secondary | ICD-10-CM | POA: Insufficient documentation

## 2012-02-26 DIAGNOSIS — G40909 Epilepsy, unspecified, not intractable, without status epilepticus: Secondary | ICD-10-CM | POA: Insufficient documentation

## 2012-02-26 DIAGNOSIS — Z862 Personal history of diseases of the blood and blood-forming organs and certain disorders involving the immune mechanism: Secondary | ICD-10-CM | POA: Insufficient documentation

## 2012-02-26 DIAGNOSIS — S301XXA Contusion of abdominal wall, initial encounter: Secondary | ICD-10-CM | POA: Insufficient documentation

## 2012-02-26 DIAGNOSIS — W1800XA Striking against unspecified object with subsequent fall, initial encounter: Secondary | ICD-10-CM

## 2012-02-26 DIAGNOSIS — F17200 Nicotine dependence, unspecified, uncomplicated: Secondary | ICD-10-CM | POA: Insufficient documentation

## 2012-02-26 DIAGNOSIS — F1020 Alcohol dependence, uncomplicated: Secondary | ICD-10-CM | POA: Insufficient documentation

## 2012-02-26 DIAGNOSIS — I10 Essential (primary) hypertension: Secondary | ICD-10-CM | POA: Insufficient documentation

## 2012-02-26 DIAGNOSIS — IMO0002 Reserved for concepts with insufficient information to code with codable children: Secondary | ICD-10-CM | POA: Insufficient documentation

## 2012-02-26 MED ORDER — HYDROCODONE-ACETAMINOPHEN 5-325 MG PO TABS: ORAL_TABLET | ORAL | Status: DC

## 2012-02-26 MED ORDER — METHOCARBAMOL 500 MG PO TABS: 1000.0000 mg | ORAL_TABLET | Freq: Four times a day (QID) | ORAL | Status: DC | PRN

## 2012-02-26 NOTE — ED Notes (Signed)
Pt sleeping. 
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
## 2012-02-26 NOTE — ED Notes (Signed)
Pt requesting something to eat and drink.
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
## 2012-04-26 ENCOUNTER — Encounter (HOSPITAL_COMMUNITY): Payer: Self-pay | Admitting: *Deleted

## 2012-04-26 ENCOUNTER — Emergency Department (HOSPITAL_COMMUNITY)
Admission: EM | Admit: 2012-04-26 | Discharge: 2012-04-26 | Disposition: A | Discharge status: Home / Self Care | Payer: Medicaid Other | Attending: Emergency Medicine | Admitting: Emergency Medicine

## 2012-04-26 ENCOUNTER — Emergency Department (HOSPITAL_COMMUNITY): Payer: Medicaid Other

## 2012-04-26 DIAGNOSIS — I10 Essential (primary) hypertension: Secondary | ICD-10-CM | POA: Insufficient documentation

## 2012-04-26 DIAGNOSIS — Z862 Personal history of diseases of the blood and blood-forming organs and certain disorders involving the immune mechanism: Secondary | ICD-10-CM | POA: Insufficient documentation

## 2012-04-26 DIAGNOSIS — F1020 Alcohol dependence, uncomplicated: Secondary | ICD-10-CM | POA: Insufficient documentation

## 2012-04-26 DIAGNOSIS — IMO0002 Reserved for concepts with insufficient information to code with codable children: Secondary | ICD-10-CM | POA: Insufficient documentation

## 2012-04-26 DIAGNOSIS — G40909 Epilepsy, unspecified, not intractable, without status epilepticus: Secondary | ICD-10-CM | POA: Insufficient documentation

## 2012-04-26 DIAGNOSIS — S62609A Fracture of unspecified phalanx of unspecified finger, initial encounter for closed fracture: Secondary | ICD-10-CM

## 2012-04-26 DIAGNOSIS — Y929 Unspecified place or not applicable: Secondary | ICD-10-CM | POA: Insufficient documentation

## 2012-04-26 DIAGNOSIS — M2550 Pain in unspecified joint: Secondary | ICD-10-CM | POA: Insufficient documentation

## 2012-04-26 DIAGNOSIS — W19XXXA Unspecified fall, initial encounter: Secondary | ICD-10-CM | POA: Insufficient documentation

## 2012-04-26 DIAGNOSIS — Y9389 Activity, other specified: Secondary | ICD-10-CM | POA: Insufficient documentation

## 2012-04-26 DIAGNOSIS — F17200 Nicotine dependence, unspecified, uncomplicated: Secondary | ICD-10-CM | POA: Insufficient documentation

## 2012-04-26 MED ORDER — MELOXICAM 7.5 MG PO TABS: 7.5000 mg | ORAL_TABLET | Freq: Every day | ORAL | Status: DC

## 2012-04-26 MED ORDER — HYDROCODONE-ACETAMINOPHEN 5-325 MG PO TABS: 1.0000 | ORAL_TABLET | ORAL | Status: DC | PRN

## 2012-04-26 NOTE — ED Notes (Signed)
Fell when "partying", injury to lt hand, swelling presnt.

## 2012-04-26 NOTE — ED Provider Notes (Signed)
Medical screening examination/treatment/procedure(s) were performed by non-physician practitioner and as supervising physician I was immediately available for consultation/collaboration.   Lyanne Co, MD 04/26/12 [phone]

## 2012-04-26 NOTE — ED Provider Notes (Signed)
History     CSN: [id number]  Arrival date & time 04/26/12  1534   First MD Initiated Contact with Patient 04/26/12 1708      Chief Complaint  Patient presents with  . Hand Injury    (Consider location/radiation/quality/duration/timing/severity/associated sxs/prior treatment) Patient is a 47 y.o. female presenting with hand injury. The history is provided by the patient.  Hand Injury  The incident occurred yesterday. The injury mechanism was a fall. The pain is present in the left hand. The quality of the pain is described as throbbing. The pain is severe. The pain has been constant since the incident. Pertinent negatives include no fever. She reports no foreign bodies present. The symptoms are aggravated by palpation. She has tried nothing for the symptoms.    Past Medical History  Diagnosis Date  . Seizure disorder   . IDA (iron deficiency anemia)     required blood tranfusion  . Hypertension   . Nicotine addiction   . Alcohol dependency     Past Surgical History  Procedure Date  . Btl   . Left knee surgery, fracture     Family History  Problem Relation Age of Onset  . Anxiety disorder Sister     History  Substance Use Topics  . Smoking status: Never Smoker   . Smokeless tobacco: Current User    Types: Snuff  . Alcohol Use: Yes     Comment: Drink on weekends and on Wednesday's share a 40 ounce beer with 2 people     OB History    Grav Para Term Preterm Abortions TAB SAB Ect Mult Living   5 5 5       5       Review of Systems  Constitutional: Negative for fever and activity change.       All ROS Neg except as noted in HPI  HENT: Negative for nosebleeds and neck pain.   Eyes: Negative for photophobia and discharge.  Respiratory: Negative for cough, shortness of breath and wheezing.   Cardiovascular: Negative for chest pain and palpitations.  Gastrointestinal: Negative for abdominal pain and blood in stool.  Genitourinary: Negative for dysuria, frequency  and hematuria.  Musculoskeletal: Positive for arthralgias. Negative for back pain.  Skin: Negative.   Neurological: Positive for seizures. Negative for dizziness and speech difficulty.  Psychiatric/Behavioral: Negative for hallucinations and confusion.    Allergies  Review of patient's allergies indicates no known allergies.  Home Medications   Current Outpatient Rx  Name  Route  Sig  Dispense  Refill  . BENAZEPRIL HCL 10 MG PO TABS   Oral   Take 1 tablet (10 mg total) by mouth daily.   30 tablet   3   . HYDROCODONE-ACETAMINOPHEN 5-325 MG PO TABS      1 or 2 tabs PO q6 hours prn pain   20 tablet   0   . METHOCARBAMOL 500 MG PO TABS   Oral   Take 2 tablets (1,000 mg total) by mouth 4 (four) times daily as needed (muscle spasm/pain).   25 tablet   0   . PHENYTOIN SODIUM EXTENDED 300 MG PO CAPS   Oral   Take 1 capsule (300 mg total) by mouth 2 (two) times daily.   60 capsule   3     BP 109/68  Pulse 88  Temp 98.1 F (36.7 C) (Oral)  Resp 18  Ht 5\' 7"  (1.702 m)  Wt 150 lb (68.04 kg)  BMI 23.49 kg/m2  SpO2 100%  LMP 04/09/2012  Physical Exam  Nursing note and vitals reviewed. Constitutional: She is oriented to person, place, and time. She appears well-developed and well-nourished.  Non-toxic appearance.  HENT:  Head: Normocephalic.  Right Ear: Tympanic membrane and external ear normal.  Left Ear: Tympanic membrane and external ear normal.  Eyes: EOM and lids are normal. Pupils are equal, round, and reactive to light.  Neck: Normal range of motion. Neck supple. Carotid bruit is not present.  Cardiovascular: Normal rate, regular rhythm, normal heart sounds, intact distal pulses and normal pulses.   Pulmonary/Chest: Breath sounds normal. No respiratory distress.  Abdominal: Soft. Bowel sounds are normal. There is no tenderness. There is no guarding.  Musculoskeletal: Normal range of motion.       Pain and swelling of the left 4th and 5th fingers and dorsum of  the left hand. FROM of the left wrist, elbow, alnd shoulder.  Lymphadenopathy:       Head (right side): No submandibular adenopathy present.       Head (left side): No submandibular adenopathy present.    She has no cervical adenopathy.  Neurological: She is alert and oriented to person, place, and time. She has normal strength. No cranial nerve deficit or sensory deficit. She exhibits normal muscle tone. Coordination normal.  Skin: Skin is warm and dry.  Psychiatric: She has a normal mood and affect. Her speech is normal.    ED Course  Procedures (including critical care time)  Labs Reviewed - No data to display Dg Hand Complete Left  04/26/2012  *RADIOLOGY REPORT*  Clinical Data: Fall.  Left hand injury, pain, and swelling.  LEFT HAND - COMPLETE 3+ VIEW  Comparison: None.  Findings: Nondisplaced fracture is seen involving the base of the proximal phalanx of the little finger.  No other fractures are identified.  No evidence of dislocation.  IMPRESSION: Nondisplaced fracture of proximal phalanx of the little finger.   Original Report Authenticated By: Myles Rosenthal, M.D.      No diagnosis found.    MDM  I have reviewed nursing notes, vital signs, and all appropriate lab and imaging results for this patient. The x-ray of the left hand reveals a nondisplaced fracture of the proximal phalanx of the fifth finger. No other fracture or dislocation noted. The patient has been fitted with a ulnar gutter splint and sling and ice pack. Prescription given for Norco 5 mg one every 4 hours as needed for pain #20 tablets. The patient is to see Dr. Hilda Lias for additional evaluation and management of her fracture.       Kathie Dike, [place] 04/26/12 1731

## 2012-05-16 ENCOUNTER — Telehealth: Payer: Self-pay | Admitting: Family Medicine

## 2012-05-16 ENCOUNTER — Other Ambulatory Visit: Payer: Self-pay

## 2012-05-16 DIAGNOSIS — R569 Unspecified convulsions: Secondary | ICD-10-CM

## 2012-05-16 DIAGNOSIS — I10 Essential (primary) hypertension: Secondary | ICD-10-CM

## 2012-05-16 MED ORDER — PHENYTOIN SODIUM EXTENDED 300 MG PO CAPS: 300.0000 mg | ORAL_CAPSULE | Freq: Two times a day (BID) | ORAL | Status: DC

## 2012-05-16 NOTE — Telephone Encounter (Signed)
Med refilled.

## 2012-07-17 ENCOUNTER — Telehealth: Payer: Self-pay | Admitting: Family Medicine

## 2012-07-17 MED ORDER — BENAZEPRIL HCL 10 MG PO TABS: 10.0000 mg | ORAL_TABLET | Freq: Every day | ORAL | Status: DC

## 2012-07-17 NOTE — Telephone Encounter (Signed)
Refill sent.

## 2012-07-27 NOTE — Telephone Encounter (Signed)
Error - patients request handled on another email same day.

## 2013-01-11 ENCOUNTER — Telehealth: Payer: Self-pay | Admitting: Family Medicine

## 2013-01-11 NOTE — Telephone Encounter (Signed)
Patient has not been here in a year. Called to make appt but no answer. Luann aware that she needs to schedule when she calls back and I can then send her in 30 days to last until she comes in

## 2013-01-16 ENCOUNTER — Ambulatory Visit (INDEPENDENT_AMBULATORY_CARE_PROVIDER_SITE_OTHER): Payer: Medicaid Other | Admitting: Family Medicine

## 2013-01-16 ENCOUNTER — Encounter: Payer: Self-pay | Admitting: Family Medicine

## 2013-01-16 VITALS — BP 152/82 | HR 68 | Resp 18 | Ht 65.5 in | Wt 155.0 lb

## 2013-01-16 DIAGNOSIS — I10 Essential (primary) hypertension: Secondary | ICD-10-CM

## 2013-01-16 DIAGNOSIS — Z9119 Patient's noncompliance with other medical treatment and regimen: Secondary | ICD-10-CM

## 2013-01-16 DIAGNOSIS — E559 Vitamin D deficiency, unspecified: Secondary | ICD-10-CM

## 2013-01-16 DIAGNOSIS — R569 Unspecified convulsions: Secondary | ICD-10-CM

## 2013-01-16 DIAGNOSIS — R5381 Other malaise: Secondary | ICD-10-CM

## 2013-01-16 DIAGNOSIS — F17200 Nicotine dependence, unspecified, uncomplicated: Secondary | ICD-10-CM

## 2013-01-16 DIAGNOSIS — Z91199 Patient's noncompliance with other medical treatment and regimen due to unspecified reason: Secondary | ICD-10-CM

## 2013-01-16 DIAGNOSIS — D509 Iron deficiency anemia, unspecified: Secondary | ICD-10-CM

## 2013-01-16 MED ORDER — ERGOCALCIFEROL 1.25 MG (50000 UT) PO CAPS: 50000.0000 [IU] | ORAL_CAPSULE | ORAL | Status: DC

## 2013-01-16 NOTE — Progress Notes (Signed)
  Subjective:    Patient ID: Cynthia Mclean, female    DOB: 14-Jul-1965, 47 y.o.   MRN: [id number]  HPI The PT is here for follow up and re-evaluation of chronic medical conditions, medication management and review of any available recent lab and radiology data.  Preventive health is updated, specifically  Cancer screening and Immunization.Refusing vaccines still    The PT denies any adverse reactions to current medications since the last visit. Has been out of her BP med for several days unfortunately. There are no new concerns.  There are no specific complaints, no seizures since last visit       Review of Systems See HPI` Denies recent fever or chills. Denies sinus pressure, nasal congestion, ear pain or sore throat. Denies chest congestion, productive cough or wheezing. Denies chest pains, palpitations and leg swelling Denies abdominal pain, nausea, vomiting,diarrhea or constipation.   Denies dysuria, frequency, hesitancy or incontinence. Denies joint pain, swelling and limitation in mobility. Denies headaches, seizures, numbness, or tingling. Denies depression, anxiety or insomnia. Denies skin break down or rash.        Objective:   Physical Exam    Patient alert and oriented and in no cardiopulmonary distress.  HEENT: No facial asymmetry, EOMI, no sinus tenderness,  oropharynx pink and moist.  Neck supple no adenopathy.  Chest: Clear to auscultation bilaterally.  CVS: S1, S2 no murmurs, no S3.  ABD: Soft non tender. Bowel sounds normal.  Ext: No edema  MS: Adequate ROM spine, shoulders, hips and knees.  Skin: Intact, no ulcerations or rash noted.  Psych: Good eye contact, normal affect. Memory intact not anxious or depressed appearing.  CNS: CN 2-12 intact, power, tone and sensation normal throughout.     Assessment & Plan:

## 2013-01-16 NOTE — Patient Instructions (Addendum)
F/U in January call if you need me before  Fasting lipid, cmp, TSH, CBC this week Friday and dilantin level  Med refilled until next January, you need to come in then  You need TDAP and flu vaccines   You need to STOP the SNUFF increases cancer , heart disease an d stroke risk

## 2013-01-17 ENCOUNTER — Other Ambulatory Visit: Payer: Self-pay | Admitting: Family Medicine

## 2013-01-17 DIAGNOSIS — Z139 Encounter for screening, unspecified: Secondary | ICD-10-CM

## 2013-01-17 MED ORDER — BENAZEPRIL HCL 10 MG PO TABS: 10.0000 mg | ORAL_TABLET | Freq: Every day | ORAL | Status: DC

## 2013-01-17 MED ORDER — PHENYTOIN SODIUM EXTENDED 300 MG PO CAPS: 300.0000 mg | ORAL_CAPSULE | Freq: Two times a day (BID) | ORAL | Status: DC

## 2013-01-19 ENCOUNTER — Other Ambulatory Visit: Payer: Self-pay | Admitting: Family Medicine

## 2013-01-19 LAB — CBC
HCT: 33.6 % — ABNORMAL LOW (ref 36.0–46.0)
Hemoglobin: 11.7 g/dL — ABNORMAL LOW (ref 12.0–15.0)
MCH: 33.5 pg (ref 26.0–34.0)
MCHC: 34.8 g/dL (ref 30.0–36.0)

## 2013-01-19 LAB — COMPREHENSIVE METABOLIC PANEL
Albumin: 3.9 g/dL (ref 3.5–5.2)
Alkaline Phosphatase: 55 U/L (ref 39–117)
BUN: 9 mg/dL (ref 6–23)
Glucose, Bld: 89 mg/dL (ref 70–99)
Potassium: 4.9 mEq/L (ref 3.5–5.3)

## 2013-01-19 LAB — LIPID PANEL
Cholesterol: 223 mg/dL — ABNORMAL HIGH (ref 0–200)
HDL: 91 mg/dL (ref >39)
Triglycerides: 96 mg/dL (ref <150)

## 2013-01-19 LAB — TSH: TSH: 1.637 u[IU]/mL (ref 0.350–4.500)

## 2013-01-21 DIAGNOSIS — Z9119 Patient's noncompliance with other medical treatment and regimen: Secondary | ICD-10-CM | POA: Insufficient documentation

## 2013-01-21 NOTE — Assessment & Plan Note (Signed)
Unchanged, ongoing snuff use, unwilling to quit Patient counseled for approximately 5 minutes regarding the health risks of ongoing nicotine use, specifically all types of cancer, heart disease, stroke and respiratory failure. The options available for help with cessation ,the behavioral changes to assist the process, and the option to either gradully reduce usage  Or abruptly stop.is also discussed. Pt is also encouraged to set specific goals in number of cigarettes used daily, as well as to set a quit date.

## 2013-01-21 NOTE — Assessment & Plan Note (Signed)
Continue weekly vit D 

## 2013-01-21 NOTE — Assessment & Plan Note (Signed)
Uncontrolled, out of meds. Importance of compliance stressed DASH diet and commitment to daily physical activity for a minimum of 30 minutes discussed and encouraged, as a part of hypertension management. The importance of attaining a healthy weight is also discussed.

## 2013-01-21 NOTE — Assessment & Plan Note (Signed)
Continues to refuse immunization despite counsel;ing and her increased risk based on occupation and seizure d/o

## 2013-01-21 NOTE — Assessment & Plan Note (Signed)
Stable and therapeutic drug level on blood testing

## 2013-01-22 LAB — FERRITIN: Ferritin: 65 ng/mL (ref 10–291)

## 2013-01-22 LAB — IRON: Iron: 33 ug/dL — ABNORMAL LOW (ref 42–145)

## 2013-01-23 ENCOUNTER — Ambulatory Visit (HOSPITAL_COMMUNITY)
Admission: RE | Admit: 2013-01-23 | Discharge: 2013-01-23 | Disposition: A | Discharge status: Home / Self Care | Payer: Medicaid Other | Source: Ambulatory Visit | Attending: Family Medicine | Admitting: Family Medicine

## 2013-01-23 DIAGNOSIS — Z1231 Encounter for screening mammogram for malignant neoplasm of breast: Secondary | ICD-10-CM | POA: Insufficient documentation

## 2013-01-23 DIAGNOSIS — Z139 Encounter for screening, unspecified: Secondary | ICD-10-CM

## 2013-03-15 ENCOUNTER — Other Ambulatory Visit: Payer: Self-pay

## 2013-03-15 DIAGNOSIS — R569 Unspecified convulsions: Secondary | ICD-10-CM

## 2013-03-15 DIAGNOSIS — I10 Essential (primary) hypertension: Secondary | ICD-10-CM

## 2013-03-15 MED ORDER — PHENYTOIN SODIUM EXTENDED 300 MG PO CAPS: 300.0000 mg | ORAL_CAPSULE | Freq: Two times a day (BID) | ORAL | Status: DC

## 2013-03-18 ENCOUNTER — Other Ambulatory Visit: Payer: Self-pay | Admitting: Family Medicine

## 2013-05-27 ENCOUNTER — Emergency Department (HOSPITAL_COMMUNITY)
Admission: EM | Admit: 2013-05-27 | Discharge: 2013-05-28 | Discharge status: Against Medical Advice | Payer: Medicaid Other | Attending: Emergency Medicine | Admitting: Emergency Medicine

## 2013-05-27 DIAGNOSIS — Z862 Personal history of diseases of the blood and blood-forming organs and certain disorders involving the immune mechanism: Secondary | ICD-10-CM | POA: Insufficient documentation

## 2013-05-27 DIAGNOSIS — R109 Unspecified abdominal pain: Secondary | ICD-10-CM

## 2013-05-27 DIAGNOSIS — Z79899 Other long term (current) drug therapy: Secondary | ICD-10-CM | POA: Insufficient documentation

## 2013-05-27 DIAGNOSIS — Y9389 Activity, other specified: Secondary | ICD-10-CM | POA: Insufficient documentation

## 2013-05-27 DIAGNOSIS — S3981XA Other specified injuries of abdomen, initial encounter: Secondary | ICD-10-CM | POA: Insufficient documentation

## 2013-05-27 DIAGNOSIS — Z3202 Encounter for pregnancy test, result negative: Secondary | ICD-10-CM | POA: Insufficient documentation

## 2013-05-27 DIAGNOSIS — G40909 Epilepsy, unspecified, not intractable, without status epilepticus: Secondary | ICD-10-CM | POA: Insufficient documentation

## 2013-05-27 DIAGNOSIS — IMO0002 Reserved for concepts with insufficient information to code with codable children: Secondary | ICD-10-CM | POA: Insufficient documentation

## 2013-05-27 DIAGNOSIS — Y92410 Unspecified street and highway as the place of occurrence of the external cause: Secondary | ICD-10-CM | POA: Insufficient documentation

## 2013-05-27 DIAGNOSIS — I10 Essential (primary) hypertension: Secondary | ICD-10-CM | POA: Insufficient documentation

## 2013-05-28 ENCOUNTER — Emergency Department (HOSPITAL_COMMUNITY): Payer: Medicaid Other

## 2013-05-28 ENCOUNTER — Encounter (HOSPITAL_COMMUNITY): Payer: Self-pay | Admitting: Emergency Medicine

## 2013-05-28 LAB — URINALYSIS, ROUTINE W REFLEX MICROSCOPIC
Bilirubin Urine: NEGATIVE
GLUCOSE, UA: NEGATIVE mg/dL
HGB URINE DIPSTICK: NEGATIVE
Ketones, ur: NEGATIVE mg/dL
Nitrite: POSITIVE — AB
PH: 5.5 (ref 5.0–8.0)
Protein, ur: NEGATIVE mg/dL
Specific Gravity, Urine: 1.01 (ref 1.005–1.030)
Urobilinogen, UA: 0.2 mg/dL (ref 0.0–1.0)

## 2013-05-28 LAB — URINE MICROSCOPIC-ADD ON

## 2013-05-28 LAB — PREGNANCY, URINE: PREG TEST UR: NEGATIVE

## 2013-05-28 NOTE — ED Notes (Signed)
Pt reporting pain in left flank area since Friday.  Denies nausea, vomiting or difficulty with urination.

## 2013-05-28 NOTE — ED Provider Notes (Signed)
CSN: [id number]     Arrival date & time 05/27/13  2330 History  This chart was scribed for Hanley SeamenJohn L Jenevie Casstevens, MD by Blanchard KelchNicole Curnes, ED Scribe. The patient was seen in room APA14/[facility]14. Patient's care was started at 12:42 AM.    Chief Complaint  Patient presents with  . Flank Pain    The history is provided by the patient. No language interpreter was used.    HPI Comments: Cynthia Mclean is a 48 y.o. female who presents to the Emergency Department complaining of constant left sided lower back pain that began three days ago.She states that she fell onto a sidewalk getting out of a car the day the pain began which she attributes as the cause of the pain. The pain is aggravated by movement. She denies dysuria, hematuria, frequency, abdominal pain, chills, fever, nausea, or vomiting.     Past Medical History  Diagnosis Date  . Seizure disorder   . IDA (iron deficiency anemia)     required blood tranfusion  . Hypertension   . Nicotine addiction   . Alcohol dependency    Past Surgical History  Procedure Laterality Date  . Btl    . Left knee surgery, fracture     Family History  Problem Relation Age of Onset  . Anxiety disorder Sister    History  Substance Use Topics  . Smoking status: Never Smoker   . Smokeless tobacco: Current User    Types: Snuff  . Alcohol Use: Yes     Comment: occasional   OB History   Grav Para Term Preterm Abortions TAB SAB Ect Mult Living   5 5 5       5      Review of Systems A complete 10 system review of systems was obtained and all systems are negative except as noted in the HPI and PMH.     Allergies  Review of patient's allergies indicates no known allergies.  Home Medications   Current Outpatient Rx  Name  Route  Sig  Dispense  Refill  . benazepril (LOTENSIN) 10 MG tablet   Oral   Take 1 tablet (10 mg total) by mouth daily.   30 tablet   3   . ergocalciferol (VITAMIN D2) 50000 UNITS capsule   Oral   Take 1 capsule (50,000 Units total)  by mouth once a week. One capsule once weekly   12 capsule   1   . phenytoin (DILANTIN) 100 MG ER capsule      TAKE 3 CAPSULES BY MOUTH TWICE DAILY   180 capsule   0   . phenytoin (DILANTIN) 300 MG ER capsule   Oral   Take 1 capsule (300 mg total) by mouth 2 (two) times daily.   60 capsule   3    Triage Vitals: BP 111/71  Pulse 83  Temp(Src) 98.6 F (37 C) (Oral)  Resp 18  Ht 5\' 6"  (1.676 m)  Wt 160 lb (72.576 kg)  BMI 25.84 kg/m2  LMP 05/14/2013   Physical Exam  Nursing note and vitals reviewed. General: Well-developed, well-nourished female in no acute distress; appearance consistent with age of record HENT: normocephalic; atraumatic Eyes: pupils equal, round and reactive to light; extraocular muscles intact Neck: supple Heart: regular rate and rhythm; no murmurs, rubs or gallops Lungs: clear to auscultation bilaterally Abdomen: soft; nondistended; nontender; no masses or hepatosplenomegaly; bowel sounds present Musculoskeletal: Pain in left posterior lower ribs. No bony deformity or crepitus.  Extremities: No deformity; full  range of motion; pulses normal Neurologic: Awake, alert and oriented; motor function intact in all extremities and symmetric; no facial droop Skin: Warm and dry Psychiatric: Normal mood and affect   ED Course  Procedures (including critical care time)   COORDINATION OF CARE: 12:46 AM -Will order x-ray of ribs. Patient verbalizes understanding and agrees with treatment plan.   MDM   1. Left flank pain    Nursing notes and vitals signs, including pulse oximetry, reviewed.  Summary of this visit's results, reviewed by myself:  Labs:  Results for orders placed during the hospital encounter of 05/27/13 (from the past 24 hour(s))  URINALYSIS, ROUTINE W REFLEX MICROSCOPIC     Status: Abnormal   Collection Time    05/28/13 12:33 AM      Result Value Range   Color, Urine YELLOW  YELLOW   APPearance CLEAR  CLEAR   Specific Gravity,  Urine 1.010  1.005 - 1.030   pH 5.5  5.0 - 8.0   Glucose, UA NEGATIVE  NEGATIVE mg/dL   Hgb urine dipstick NEGATIVE  NEGATIVE   Bilirubin Urine NEGATIVE  NEGATIVE   Ketones, ur NEGATIVE  NEGATIVE mg/dL   Protein, ur NEGATIVE  NEGATIVE mg/dL   Urobilinogen, UA 0.2  0.0 - 1.0 mg/dL   Nitrite POSITIVE (*) NEGATIVE   Leukocytes, UA TRACE (*) NEGATIVE  PREGNANCY, URINE     Status: None   Collection Time    05/28/13 12:33 AM      Result Value Range   Preg Test, Ur NEGATIVE  NEGATIVE  URINE MICROSCOPIC-ADD ON     Status: Abnormal   Collection Time    05/28/13 12:33 AM      Result Value Range   Squamous Epithelial / LPF FEW (*) RARE   WBC, UA 3-6  <3 WBC/hpf   RBC / HPF 0-2  <3 RBC/hpf   Bacteria, UA MANY (*) RARE    Imaging Studies: Dg Ribs Unilateral W/chest Left  05/28/2013   CLINICAL DATA:  Seizures. Status post fall with a blow to the left side of the chest.  EXAM: LEFT RIBS AND CHEST - 3+ VIEW  COMPARISON:  A single view of the chest 02/26/2012.  FINDINGS: Lungs are clear. Heart size is normal. No pneumothorax or pleural effusion. No acute fracture is identified. Remote right hip eighth fracture is again seen.  IMPRESSION: No acute finding.   Electronically Signed   By: Drusilla Kanner M.D.   On: 05/28/2013 01:39    2:07 AM Patient eloped without giving staff opportunity to intervene.  I personally performed the services described in this documentation, which was scribed in my presence.  The recorded information has been reviewed and is accurate.    Hanley Seamen, MD 05/28/13 [phone]

## 2013-05-30 LAB — URINE CULTURE: Colony Count: >=100000

## 2013-06-02 ENCOUNTER — Telehealth (HOSPITAL_COMMUNITY): Payer: Self-pay | Admitting: Emergency Medicine

## 2013-06-02 NOTE — ED Notes (Signed)
Post ED Visit - Positive Culture Follow-up  Culture report reviewed by antimicrobial stewardship pharmacist: []  Wes Dulaney, Pharm.D., BCPS []  Celedonio MiyamotoJeremy Frens, Pharm.D., BCPS []  Georgina PillionElizabeth Martin, Pharm.D., BCPS [x]  HawleyMinh Pham, [address] Rainbow BoulevardPharm.D., BCPS, AAHIVP []  Estella HuskMichelle Turner, Pharm.D., BCPS, AAHIVP  Positive urine culture Per pharmacist, no treatment needed and no further patient follow-up is required at this time.  [place], Jenel LucksKylie 06/02/2013, 9:45 AM

## 2013-06-04 ENCOUNTER — Ambulatory Visit (INDEPENDENT_AMBULATORY_CARE_PROVIDER_SITE_OTHER): Payer: Medicaid Other | Admitting: Family Medicine

## 2013-06-04 ENCOUNTER — Encounter: Payer: Self-pay | Admitting: Family Medicine

## 2013-06-04 ENCOUNTER — Encounter (INDEPENDENT_AMBULATORY_CARE_PROVIDER_SITE_OTHER): Payer: Self-pay

## 2013-06-04 VITALS — BP 114/64 | HR 68 | Resp 18 | Ht 65.5 in | Wt 160.1 lb

## 2013-06-04 DIAGNOSIS — R5383 Other fatigue: Secondary | ICD-10-CM

## 2013-06-04 DIAGNOSIS — R569 Unspecified convulsions: Secondary | ICD-10-CM

## 2013-06-04 DIAGNOSIS — D509 Iron deficiency anemia, unspecified: Secondary | ICD-10-CM

## 2013-06-04 DIAGNOSIS — N3000 Acute cystitis without hematuria: Secondary | ICD-10-CM

## 2013-06-04 DIAGNOSIS — I10 Essential (primary) hypertension: Secondary | ICD-10-CM

## 2013-06-04 DIAGNOSIS — R5381 Other malaise: Secondary | ICD-10-CM

## 2013-06-04 DIAGNOSIS — E559 Vitamin D deficiency, unspecified: Secondary | ICD-10-CM

## 2013-06-04 MED ORDER — LEVOFLOXACIN 500 MG PO TABS
500.0000 mg | ORAL_TABLET | Freq: Every day | ORAL | Status: DC
Start: 2013-06-04 — End: 2013-08-14

## 2013-06-04 MED ORDER — PHENYTOIN SODIUM EXTENDED 300 MG PO CAPS
300.0000 mg | ORAL_CAPSULE | Freq: Two times a day (BID) | ORAL | Status: DC
Start: 2013-06-04 — End: 2013-08-22

## 2013-06-04 NOTE — Patient Instructions (Addendum)
CPE early October, call if you need me before  Med is sent in for bladder infection   Fasting labs next week  Bring all meds to next visit.

## 2013-06-04 NOTE — Assessment & Plan Note (Signed)
Established UTI, left Ed before being trsated, med prescribed today, abn UA persists

## 2013-06-04 NOTE — Assessment & Plan Note (Signed)
Controlled, no change in medication Updated lab needed at/ before next visit. Med refilled

## 2013-06-04 NOTE — Progress Notes (Signed)
   Subjective:    Patient ID: Cynthia Mclean, female    DOB: 1965/06/16, 48 y.o.   MRN: [id number]  HPI Pt absconded form ED 1 week ago before she was treated for UTI she presented for. Here for f/u , still symptomatic and UA abnormal. Denies fever , chills or flank pain or nausea. No recent seizures , needs med refilled. Labs and routine health maintainance reviewed and updated    Review of Systems See HPI Denies recent fever or chills. Denies sinus pressure, nasal congestion, ear pain or sore throat. Denies chest congestion, productive cough or wheezing. Denies chest pains, palpitations and leg swelling Denies abdominal pain, nausea, vomiting,diarrhea or constipation.   . Denies joint pain, swelling and limitation in mobility. Denies headaches, seizures, numbness, or tingling. Denies depression, anxiety or insomnia. Denies skin break down or rash.        Objective:   Physical Exam BP 114/64  Pulse 68  Resp 18  Ht 5' 5.5" (1.664 m)  Wt 160 lb 1.3 oz (72.612 kg)  BMI 26.22 kg/m2  SpO2 99%  LMP 05/14/2013 Patient alert and oriented and in no cardiopulmonary distress.  HEENT: No facial asymmetry, EOMI, no sinus tenderness,  oropharynx pink and moist.  Neck supple no adenopathy.  Chest: Clear to auscultation bilaterally.Decreased though adequate air entry  CVS: S1, S2 no murmurs, no S3.  ABD: Soft non tender. Bowel sounds normal.  Ext: No edema  MS: Adequate ROM spine, shoulders, hips and knees.  Skin: Intact, no ulcerations or rash noted.  Psych: Good eye contact, normal affect. Memory intact not anxious or depressed appearing.  CNS: CN 2-12 intact, power, tone and sensation normal throughout.        Assessment & Plan:  Acute cystitis Established UTI, left Ed before being trsated, med prescribed today, abn UA persists  HYPERTENSION Controlled, no change in medication DASH diet and commitment to daily physical activity for a minimum of 30 minutes  discussed and encouraged, as a part of hypertension management. The importance of attaining a healthy weight is also discussed.   SEIZURE DISORDER Controlled, no change in medication Updated lab needed at/ before next visit. Med refilled   Unspecified vitamin D deficiency Updated lab needed at/ before next visit.

## 2013-06-04 NOTE — Assessment & Plan Note (Signed)
Updated lab needed at/ before next visit.   

## 2013-06-04 NOTE — Assessment & Plan Note (Signed)
Controlled, no change in medication DASH diet and commitment to daily physical activity for a minimum of 30 minutes discussed and encouraged, as a part of hypertension management. The importance of attaining a healthy weight is also discussed.  

## 2013-06-05 ENCOUNTER — Other Ambulatory Visit: Payer: Self-pay | Admitting: Family Medicine

## 2013-06-05 ENCOUNTER — Telehealth: Payer: Self-pay | Admitting: *Deleted

## 2013-06-05 NOTE — Telephone Encounter (Signed)
Pt called saying her pharmacy does not have her RX. Pt had a office visit yesterday. Please advise [phone] Pharmacy is telling patient that they need to speak with Dr. Lodema HongSimpson. Pt needs her seizure medication.

## 2013-06-05 NOTE — Telephone Encounter (Signed)
[facility] states the med is out of stock. Advised patient we could send the rx elsewhere or she could call [facility] and check and see when they are expecting the med and to call me back if she wants me to send it somewhere else

## 2013-06-07 LAB — URINE CULTURE: Colony Count: >=100000

## 2013-06-26 ENCOUNTER — Other Ambulatory Visit: Payer: Self-pay | Admitting: Family Medicine

## 2013-06-27 ENCOUNTER — Other Ambulatory Visit: Payer: Self-pay

## 2013-06-27 MED ORDER — BENAZEPRIL HCL 10 MG PO TABS: 10.0000 mg | ORAL_TABLET | Freq: Every day | ORAL | Status: DC

## 2013-08-14 ENCOUNTER — Encounter: Payer: Self-pay | Admitting: Family Medicine

## 2013-08-14 ENCOUNTER — Ambulatory Visit (INDEPENDENT_AMBULATORY_CARE_PROVIDER_SITE_OTHER): Payer: Medicaid Other | Admitting: Family Medicine

## 2013-08-14 ENCOUNTER — Encounter (INDEPENDENT_AMBULATORY_CARE_PROVIDER_SITE_OTHER): Payer: Self-pay

## 2013-08-14 ENCOUNTER — Other Ambulatory Visit: Payer: Self-pay | Admitting: Family Medicine

## 2013-08-14 VITALS — BP 132/80 | HR 60 | Resp 18 | Ht 65.5 in | Wt 162.0 lb

## 2013-08-14 DIAGNOSIS — R42 Dizziness and giddiness: Secondary | ICD-10-CM

## 2013-08-14 DIAGNOSIS — R0989 Other specified symptoms and signs involving the circulatory and respiratory systems: Secondary | ICD-10-CM | POA: Insufficient documentation

## 2013-08-14 DIAGNOSIS — F17200 Nicotine dependence, unspecified, uncomplicated: Secondary | ICD-10-CM

## 2013-08-14 DIAGNOSIS — T420X1A Poisoning by hydantoin derivatives, accidental (unintentional), initial encounter: Secondary | ICD-10-CM

## 2013-08-14 DIAGNOSIS — E785 Hyperlipidemia, unspecified: Secondary | ICD-10-CM

## 2013-08-14 DIAGNOSIS — R569 Unspecified convulsions: Secondary | ICD-10-CM

## 2013-08-14 DIAGNOSIS — I10 Essential (primary) hypertension: Secondary | ICD-10-CM

## 2013-08-14 DIAGNOSIS — E559 Vitamin D deficiency, unspecified: Secondary | ICD-10-CM

## 2013-08-14 LAB — CBC
HCT: 34.6 % — ABNORMAL LOW (ref 36.0–46.0)
HEMOGLOBIN: 11.8 g/dL — AB (ref 12.0–15.0)
MCH: 31.7 pg (ref 26.0–34.0)
MCHC: 34.1 g/dL (ref 30.0–36.0)
MCV: 93 fL (ref 78.0–100.0)
Platelets: 277 10*3/uL (ref 150–400)
RBC: 3.72 MIL/uL — AB (ref 3.87–5.11)
RDW: 15.4 % (ref 11.5–15.5)
WBC: 7.8 10*3/uL (ref 4.0–10.5)

## 2013-08-14 MED ORDER — MECLIZINE HCL 12.5 MG PO TABS: 12.5000 mg | ORAL_TABLET | Freq: Two times a day (BID) | ORAL | Status: DC | PRN

## 2013-08-14 NOTE — Patient Instructions (Addendum)
F/u in 4 month, call if you need me before   Please commit to stopping using snuff as this increases your risk of cancer espescially in the mouth  You are referred for an US of neck arteries to ensure there is no blockage in the arteries   EKG today, labs today are chem 7 , TSH, vit D, CBC, dilantin level, lipid panel  Medication is sent in for dizziness/ use only if needed  Blood pressure is normal

## 2013-08-14 NOTE — Progress Notes (Signed)
   Subjective:    Patient ID: Cynthia HeadsEdna L Cameron, female    DOB: 06-28-1965, 48 y.o.   MRN: [id number]  HPI 3 day h/o intwermittent light headeness as if she will pas out, no recent fever , chills or sinus symptoms, occurs for 5 mins twice dialy, denies alcohol or drug use, denies change in dilantin dose she has been taking   Review of Systems    See HPI Denies recent fever or chills. Denies sinus pressure, nasal congestion, ear pain or sore throat. Denies chest congestion, productive cough or wheezing. Denies chest pains, palpitations and leg swelling Denies abdominal pain, nausea, vomiting,diarrhea or constipation.   Denies dysuria, frequency, hesitancy or incontinence. Denies joint pain, swelling and limitation in mobility. Denies headaches, seizures, numbness, or tingling. Denies depression, anxiety or insomnia. Denies skin break down or rash.     Objective:   Physical Exam BP 132/80  Pulse 60  Resp 18  Ht 5' 5.5" (1.664 m)  Wt 162 lb (73.483 kg)  BMI 26.54 kg/m2  SpO2 100% Patient alert and oriented and in no cardiopulmonary distress.  HEENT: No facial asymmetry, EOMI, no sinus tenderness,  oropharynx pink and moist.  Neck supple no adenopathy.No nystagmus, carotid bruit  Chest: Clear to auscultation bilaterally.  CVS: S1, S2 no murmurs, no S3.  ABD: Soft non tender. Bowel sounds normal.  Ext: No edema  MS: Adequate ROM spine, shoulders, hips and knees.  Skin: Intact, no ulcerations or rash noted.  Psych: Good eye contact, normal affect. Memory intact not anxious or depressed appearing.  CNS: CN 2-12 intact, power, tone and sensation normal throughout.        Assessment & Plan:  Vertigo Likely due to dilantin toxicity will check stat lab and go from there. Meclizine also prescribed for symptom control  NICOTINE ADDICTION Unchanged use of snuff Patient counseled for approximately 5 minutes regarding the health risks of ongoing nicotine use, specifically  all types of cancer, heart disease, stroke and respiratory failure. The options available for help with cessation ,the behavioral changes to assist the process, and the option to either gradully reduce usage  Or abruptly stop.is also discussed. Pt is also encouraged to set specific goals in number of cigarettes used daily, as well as to set a quit date.   HYPERTENSION Controlled, no change in medication   SEIZURE DISORDER No recent seizure but may have dilantin toxicity  Carotid bruit Needs carotid doppler to further eval esp in light of c/o vertigo  Other and unspecified hyperlipidemia Deteriorated. Med compliance as well as dietary change is stressed  Dilantin toxicity Hold dilantin, reassess until within normal therapeutic range and resume at lower dose

## 2013-08-15 LAB — HEPATIC FUNCTION PANEL
ALK PHOS: 53 U/L (ref 39–117)
ALT: 11 U/L (ref 0–35)
AST: 21 U/L (ref 0–37)
Albumin: 4 g/dL (ref 3.5–5.2)
BILIRUBIN TOTAL: 0.5 mg/dL (ref 0.2–1.2)
Bilirubin, Direct: 0.1 mg/dL (ref 0.0–0.3)
Indirect Bilirubin: 0.4 mg/dL (ref 0.2–1.2)
Total Protein: 7.8 g/dL (ref 6.0–8.3)

## 2013-08-15 LAB — BASIC METABOLIC PANEL
BUN: 5 mg/dL — ABNORMAL LOW (ref 6–23)
CALCIUM: 9.3 mg/dL (ref 8.4–10.5)
CO2: 27 mEq/L (ref 19–32)
Chloride: 99 mEq/L (ref 96–112)
Creat: 0.5 mg/dL (ref 0.50–1.10)
Glucose, Bld: 92 mg/dL (ref 70–99)
Potassium: 4.2 mEq/L (ref 3.5–5.3)
Sodium: 136 mEq/L (ref 135–145)

## 2013-08-15 LAB — TSH: TSH: 2.318 u[IU]/mL (ref 0.350–4.500)

## 2013-08-15 LAB — IRON: Iron: 123 ug/dL (ref 42–145)

## 2013-08-15 LAB — PHENYTOIN LEVEL, TOTAL: Phenytoin Lvl: 41.7 ug/mL — ABNORMAL HIGH (ref 10.0–20.0)

## 2013-08-15 LAB — FERRITIN: Ferritin: 32 ng/mL (ref 10–291)

## 2013-08-15 LAB — LIPID PANEL
CHOL/HDL RATIO: 2.3 ratio
CHOLESTEROL: 258 mg/dL — AB (ref 0–200)
HDL: 114 mg/dL (ref >39)
LDL Cholesterol: 128 mg/dL — ABNORMAL HIGH (ref 0–99)
TRIGLYCERIDES: 78 mg/dL (ref <150)
VLDL: 16 mg/dL (ref 0–40)

## 2013-08-15 LAB — VITAMIN D 25 HYDROXY (VIT D DEFICIENCY, FRACTURES): VIT D 25 HYDROXY: 16 ng/mL — AB (ref 30–89)

## 2013-08-16 ENCOUNTER — Other Ambulatory Visit: Payer: Self-pay | Admitting: Family Medicine

## 2013-08-16 ENCOUNTER — Other Ambulatory Visit: Payer: Self-pay

## 2013-08-16 DIAGNOSIS — E785 Hyperlipidemia, unspecified: Secondary | ICD-10-CM | POA: Insufficient documentation

## 2013-08-16 MED ORDER — ATORVASTATIN CALCIUM 20 MG PO TABS
20.0000 mg | ORAL_TABLET | Freq: Every day | ORAL | Status: DC
Start: 2013-08-16 — End: 2013-08-16

## 2013-08-17 LAB — PHENYTOIN LEVEL, TOTAL: Phenytoin Lvl: 31.7 ug/mL (ref 10.0–20.0)

## 2013-08-21 LAB — PHENYTOIN LEVEL, TOTAL: Phenytoin Lvl: 11.9 ug/mL (ref 10.0–20.0)

## 2013-08-22 ENCOUNTER — Other Ambulatory Visit: Payer: Self-pay | Admitting: Family Medicine

## 2013-08-22 MED ORDER — PHENYTOIN SODIUM EXTENDED 300 MG PO CAPS: ORAL_CAPSULE | ORAL | Status: DC

## 2013-09-05 LAB — PHENYTOIN LEVEL, TOTAL: PHENYTOIN LVL: 14.7 ug/mL (ref 10.0–20.0)

## 2013-09-07 DIAGNOSIS — T420X1A Poisoning by hydantoin derivatives, accidental (unintentional), initial encounter: Secondary | ICD-10-CM | POA: Insufficient documentation

## 2013-09-07 NOTE — Assessment & Plan Note (Signed)
Unchanged use of snuff Patient counseled for approximately 5 minutes regarding the health risks of ongoing nicotine use, specifically all types of cancer, heart disease, stroke and respiratory failure. The options available for help with cessation ,the behavioral changes to assist the process, and the option to either gradully reduce usage  Or abruptly stop.is also discussed. Pt is also encouraged to set specific goals in number of cigarettes used daily, as well as to set a quit date.

## 2013-09-07 NOTE — Assessment & Plan Note (Signed)
Hold dilantin, reassess until within normal therapeutic range and resume at lower dose

## 2013-09-07 NOTE — Assessment & Plan Note (Signed)
Likely due to dilantin toxicity will check stat lab and go from there. Meclizine also prescribed for symptom control

## 2013-09-07 NOTE — Assessment & Plan Note (Signed)
Deteriorated. Med compliance as well as dietary change is stressed

## 2013-09-07 NOTE — Assessment & Plan Note (Signed)
No recent seizure but may have dilantin toxicity

## 2013-09-07 NOTE — Assessment & Plan Note (Signed)
Controlled, no change in medication  

## 2013-09-07 NOTE — Assessment & Plan Note (Signed)
Needs carotid doppler to further eval esp in light of c/o vertigo

## 2013-09-11 ENCOUNTER — Encounter: Payer: Self-pay | Admitting: Family Medicine

## 2014-01-08 ENCOUNTER — Ambulatory Visit: Payer: Medicaid Other | Admitting: Family Medicine

## 2014-01-15 ENCOUNTER — Other Ambulatory Visit: Payer: Self-pay | Admitting: Family Medicine

## 2014-01-16 ENCOUNTER — Other Ambulatory Visit: Payer: Self-pay | Admitting: Family Medicine

## 2014-02-11 ENCOUNTER — Other Ambulatory Visit: Payer: Self-pay | Admitting: Family Medicine

## 2014-02-11 ENCOUNTER — Telehealth: Payer: Self-pay | Admitting: Family Medicine

## 2014-02-11 MED ORDER — PHENYTOIN SODIUM EXTENDED 300 MG PO CAPS: ORAL_CAPSULE | ORAL | Status: DC

## 2014-02-11 NOTE — Telephone Encounter (Signed)
Med refilled.

## 2014-02-14 ENCOUNTER — Other Ambulatory Visit: Payer: Self-pay | Admitting: Family Medicine

## 2014-02-14 DIAGNOSIS — Z1231 Encounter for screening mammogram for malignant neoplasm of breast: Secondary | ICD-10-CM

## 2014-02-25 ENCOUNTER — Encounter: Payer: Self-pay | Admitting: Family Medicine

## 2014-02-26 ENCOUNTER — Encounter: Payer: Self-pay | Admitting: Family Medicine

## 2014-02-26 ENCOUNTER — Encounter (INDEPENDENT_AMBULATORY_CARE_PROVIDER_SITE_OTHER): Payer: Self-pay

## 2014-02-26 ENCOUNTER — Ambulatory Visit (INDEPENDENT_AMBULATORY_CARE_PROVIDER_SITE_OTHER): Payer: Medicaid Other | Admitting: Family Medicine

## 2014-02-26 VITALS — BP 130/70 | HR 66 | Resp 18 | Ht 66.0 in | Wt 158.1 lb

## 2014-02-26 DIAGNOSIS — I10 Essential (primary) hypertension: Secondary | ICD-10-CM

## 2014-02-26 DIAGNOSIS — D509 Iron deficiency anemia, unspecified: Secondary | ICD-10-CM

## 2014-02-26 DIAGNOSIS — F17200 Nicotine dependence, unspecified, uncomplicated: Secondary | ICD-10-CM

## 2014-02-26 DIAGNOSIS — Z720 Tobacco use: Secondary | ICD-10-CM

## 2014-02-26 DIAGNOSIS — E559 Vitamin D deficiency, unspecified: Secondary | ICD-10-CM

## 2014-02-26 DIAGNOSIS — E785 Hyperlipidemia, unspecified: Secondary | ICD-10-CM

## 2014-02-26 DIAGNOSIS — Z9119 Patient's noncompliance with other medical treatment and regimen: Secondary | ICD-10-CM

## 2014-02-26 DIAGNOSIS — Z91199 Patient's noncompliance with other medical treatment and regimen due to unspecified reason: Secondary | ICD-10-CM

## 2014-02-26 DIAGNOSIS — R569 Unspecified convulsions: Secondary | ICD-10-CM

## 2014-02-26 MED ORDER — ERGOCALCIFEROL 1.25 MG (50000 UT) PO CAPS: 50000.0000 [IU] | ORAL_CAPSULE | ORAL | Status: DC

## 2014-02-26 NOTE — Patient Instructions (Addendum)
CPE and pap in 4 month, call if you need me before  Fasting lipid, cmp, cBc , iron and ferritin this week, dilantin level  Need once weekly vit D this is sent in  Please continue to cut back on snuff, reduce cancer risk!

## 2014-02-26 NOTE — Progress Notes (Signed)
   Subjective:    Patient ID: Cynthia Mclean, female    DOB: [birth year], 48 y.o.   MRN: [id number]  HPI The PT is here for follow up and re-evaluation of chronic medical conditions, medication management and review of any available recent lab and radiology data.  Preventive health is updated, specifically  Cancer screening and Immunization.   Questions or concerns regarding consultations or procedures which the PT has had in the interim are  addressed. The PT denies any adverse reactions to current medications since the last visit.  There are no new concerns.  There are no specific complaints       Review of Systems See HPI Denies recent fever or chills. Denies sinus pressure, nasal congestion, ear pain or sore throat. Denies chest congestion, productive cough or wheezing. Denies chest pains, palpitations and leg swelling Denies abdominal pain, nausea, vomiting,diarrhea or constipation.   Denies dysuria, frequency, hesitancy or incontinence. Denies joint pain, swelling and limitation in mobility. Denies headaches, seizures, numbness, or tingling. Denies depression, anxiety or insomnia. Denies skin break down or rash.        Objective:   Physical Exam BP 130/70 mmHg  Pulse 66  Resp 18  Ht 5\' 6"  (1.676 m)  Wt 158 lb 1.9 oz (71.723 kg)  BMI 25.53 kg/m2  SpO2 98% Patient alert and oriented and in no cardiopulmonary distress.  HEENT: No facial asymmetry, EOMI,   oropharynx pink and moist.  Neck supple no JVD, no mass.  Chest: Clear to auscultation bilaterally.  CVS: S1, S2 no murmurs, no S3.Regular rate.  ABD: Soft non tender.   Ext: No edema  MS: Adequate ROM spine, shoulders, hips and knees.  Skin: Intact, no ulcerations or rash noted.  Psych: Good eye contact, normal affect. Memory intact not anxious or depressed appearing.  CNS: CN 2-12 intact, power,  normal throughout.no focal deficits noted.        Assessment & Plan:  Essential  hypertension Controlled, no change in medication DASH diet and commitment to daily physical activity for a minimum of 30 minutes discussed and encouraged, as a part of hypertension management. The importance of attaining a healthy weight is also discussed.   Convulsions No recent seizures updated dilantin level needed  Hyperlipidemia LDL goal <100 Updated lab needed , uncontrolled whe last checked Hyperlipidemia:Low fat diet discussed and encouraged.  t.   Vitamin D deficiency Weekly vit D needed  NICOTINE ADDICTION reducing use of snuff, unwilling to set quit daye  Patient counseled for approximately 5 minutes regarding the health risks of ongoing nicotine use, specifically all types of cancer, heart disease, stroke and respiratory failure. The options available for help with cessation ,the behavioral changes to assist the process, and the option to either gradully reduce usage  Or abruptly stop.is also discussed. Pt is also encouraged to set specific goals in number of cigarettes used daily, as well as to set a quit date.   Medical non-compliance Continues to refuse flu vaccine, education done

## 2014-03-03 NOTE — Assessment & Plan Note (Signed)
No recent seizures updated dilantin level needed

## 2014-03-03 NOTE — Assessment & Plan Note (Addendum)
reducing use of snuff, unwilling to set quit daye  Patient counseled for approximately 5 minutes regarding the health risks of ongoing nicotine use, specifically all types of cancer, heart disease, stroke and respiratory failure. The options available for help with cessation ,the behavioral changes to assist the process, and the option to either gradully reduce usage  Or abruptly stop.is also discussed. Pt is also encouraged to set specific goals in number of cigarettes used daily, as well as to set a quit date.

## 2014-03-03 NOTE — Assessment & Plan Note (Signed)
Controlled, no change in medication DASH diet and commitment to daily physical activity for a minimum of 30 minutes discussed and encouraged, as a part of hypertension management. The importance of attaining a healthy weight is also discussed.  

## 2014-03-03 NOTE — Assessment & Plan Note (Signed)
Updated lab needed , uncontrolled whe last checked Hyperlipidemia:Low fat diet discussed and encouraged.  t.

## 2014-03-03 NOTE — Assessment & Plan Note (Addendum)
Continues to refuse flu vaccine, education done

## 2014-03-03 NOTE — Assessment & Plan Note (Signed)
Weekly vit D needed

## 2014-03-04 ENCOUNTER — Ambulatory Visit (HOSPITAL_COMMUNITY)
Admission: RE | Admit: 2014-03-04 | Discharge: 2014-03-04 | Disposition: A | Discharge status: Home / Self Care | Payer: Medicaid Other | Source: Ambulatory Visit | Attending: Family Medicine | Admitting: Family Medicine

## 2014-03-04 DIAGNOSIS — Z1231 Encounter for screening mammogram for malignant neoplasm of breast: Secondary | ICD-10-CM | POA: Diagnosis not present

## 2014-03-05 ENCOUNTER — Other Ambulatory Visit: Payer: Self-pay | Admitting: Family Medicine

## 2014-03-05 LAB — COMPREHENSIVE METABOLIC PANEL
ALK PHOS: 48 U/L (ref 39–117)
ALT: 10 U/L (ref 0–35)
AST: 18 U/L (ref 0–37)
Albumin: 3.8 g/dL (ref 3.5–5.2)
BILIRUBIN TOTAL: 0.3 mg/dL (ref 0.2–1.2)
BUN: 7 mg/dL (ref 6–23)
CO2: 29 mEq/L (ref 19–32)
CREATININE: 0.58 mg/dL (ref 0.50–1.10)
Calcium: 9.3 mg/dL (ref 8.4–10.5)
Chloride: 102 mEq/L (ref 96–112)
GLUCOSE: 82 mg/dL (ref 70–99)
Potassium: 4.2 mEq/L (ref 3.5–5.3)
Sodium: 141 mEq/L (ref 135–145)
Total Protein: 7.4 g/dL (ref 6.0–8.3)

## 2014-03-05 LAB — CBC
HEMATOCRIT: 37.6 % (ref 36.0–46.0)
Hemoglobin: 12.3 g/dL (ref 12.0–15.0)
MCH: 31.7 pg (ref 26.0–34.0)
MCHC: 32.7 g/dL (ref 30.0–36.0)
MCV: 96.9 fL (ref 78.0–100.0)
Platelets: 357 10*3/uL (ref 150–400)
RBC: 3.88 MIL/uL (ref 3.87–5.11)
RDW: 14.8 % (ref 11.5–15.5)
WBC: 8.9 10*3/uL (ref 4.0–10.5)

## 2014-03-05 LAB — LIPID PANEL
CHOL/HDL RATIO: 2.4 ratio
Cholesterol: 250 mg/dL — ABNORMAL HIGH (ref 0–200)
HDL: 106 mg/dL (ref >39)
LDL Cholesterol: 122 mg/dL — ABNORMAL HIGH (ref 0–99)
TRIGLYCERIDES: 108 mg/dL (ref <150)
VLDL: 22 mg/dL (ref 0–40)

## 2014-03-05 LAB — FERRITIN: Ferritin: 37 ng/mL (ref 10–291)

## 2014-03-05 LAB — PHENYTOIN LEVEL, TOTAL: Phenytoin Lvl: 7.5 ug/mL — ABNORMAL LOW (ref 10.0–20.0)

## 2014-03-05 LAB — IRON: Iron: 85 ug/dL (ref 42–145)

## 2014-03-05 MED ORDER — PHENYTOIN SODIUM EXTENDED 100 MG PO CAPS
100.0000 mg | ORAL_CAPSULE | Freq: Every day | ORAL | Status: DC
Start: 2014-03-05 — End: 2015-04-17

## 2014-03-05 MED ORDER — PHENYTOIN SODIUM EXTENDED 300 MG PO CAPS: 300.0000 mg | ORAL_CAPSULE | Freq: Three times a day (TID) | ORAL | Status: DC

## 2014-03-05 MED ORDER — ATORVASTATIN CALCIUM 20 MG PO TABS: ORAL_TABLET | ORAL | Status: DC

## 2014-03-05 NOTE — Addendum Note (Signed)
Addended by: Kandis FantasiaSLADE, Chailyn Racette B on: 03/05/2014 02:51 PM   Modules accepted: Orders

## 2014-03-05 NOTE — Addendum Note (Signed)
Addended by: Kandis FantasiaSLADE, Rashaun Wichert B on: 03/05/2014 02:53 PM   Modules accepted: Orders

## 2014-06-28 ENCOUNTER — Other Ambulatory Visit: Payer: Self-pay | Admitting: Family Medicine

## 2014-07-04 ENCOUNTER — Encounter: Payer: Medicaid Other | Admitting: Family Medicine

## 2015-01-20 ENCOUNTER — Telehealth: Payer: Self-pay | Admitting: Family Medicine

## 2015-01-20 ENCOUNTER — Telehealth: Payer: Self-pay | Admitting: Specialist

## 2015-01-20 NOTE — Telephone Encounter (Signed)
Open in Error.

## 2015-01-20 NOTE — Telephone Encounter (Signed)
Patient is requesting refills on phenytoin (DILANTIN) 100 MG ER capsule and benazepril (LOTENSIN) 10 MG tablet please advise?

## 2015-01-21 NOTE — Telephone Encounter (Signed)
Patient has not been seen since 02/2014.  She will need to schedule an appointment.

## 2015-01-21 NOTE — Telephone Encounter (Signed)
Patient is scheduled for the next available pt requested afternoon 04/08/15 at 2:45, pt is requesting her medications to be refilled until this appt.

## 2015-04-08 ENCOUNTER — Ambulatory Visit (INDEPENDENT_AMBULATORY_CARE_PROVIDER_SITE_OTHER): Payer: Self-pay | Admitting: Family Medicine

## 2015-04-08 VITALS — BP 158/90 | HR 84 | Resp 18 | Ht 66.0 in | Wt 180.1 lb

## 2015-04-08 DIAGNOSIS — Z1231 Encounter for screening mammogram for malignant neoplasm of breast: Secondary | ICD-10-CM

## 2015-04-08 DIAGNOSIS — R569 Unspecified convulsions: Secondary | ICD-10-CM

## 2015-04-08 DIAGNOSIS — Z91199 Patient's noncompliance with other medical treatment and regimen due to unspecified reason: Secondary | ICD-10-CM

## 2015-04-08 DIAGNOSIS — Z79899 Other long term (current) drug therapy: Secondary | ICD-10-CM

## 2015-04-08 DIAGNOSIS — I10 Essential (primary) hypertension: Secondary | ICD-10-CM

## 2015-04-08 DIAGNOSIS — Z114 Encounter for screening for human immunodeficiency virus [HIV]: Secondary | ICD-10-CM

## 2015-04-08 DIAGNOSIS — Z23 Encounter for immunization: Secondary | ICD-10-CM

## 2015-04-08 DIAGNOSIS — F17200 Nicotine dependence, unspecified, uncomplicated: Secondary | ICD-10-CM

## 2015-04-08 DIAGNOSIS — E559 Vitamin D deficiency, unspecified: Secondary | ICD-10-CM

## 2015-04-08 DIAGNOSIS — E785 Hyperlipidemia, unspecified: Secondary | ICD-10-CM

## 2015-04-08 DIAGNOSIS — Z9119 Patient's noncompliance with other medical treatment and regimen: Secondary | ICD-10-CM

## 2015-04-08 DIAGNOSIS — D509 Iron deficiency anemia, unspecified: Secondary | ICD-10-CM

## 2015-04-08 NOTE — Patient Instructions (Addendum)
Annual physical exam in 2 month, call if you need me sooner  All 5 medications , which you NEED are sent in , please call if cost is too high so that we are aware and can make changes Blood pressure is high, you need to take medication veery day NEED TO STOP SNUFF, to lower risk of cancer, heart disease and stroke  You are referred for a mammogram , past due   Fasting labs this week  Flu vaccine today \

## 2015-04-08 NOTE — Progress Notes (Signed)
   Subjective:    Patient ID: Cynthia Mclean, female    DOB: January 30, 1966, 49 y.o.   MRN: [id number]  HPI   Cynthia Mclean     MRN: [id number]      DOB: January 30, 1966   HPI Ms. Cynthia Mclean is here for follow up and re-evaluation of chronic medical conditions, medication management and review of any available recent lab and radiology data.  Preventive health is updated, specifically  Cancer screening and Immunization.  All past due , has not been in for over 12 months Has had no medication for over 2 months   There are no new concerns.  There are no specific complaints   ROS Denies recent fever or chills. Denies sinus pressure, nasal congestion, ear pain or sore throat. Denies chest congestion, productive cough or wheezing. Denies chest pains, palpitations and leg swelling Denies abdominal pain, nausea, vomiting,diarrhea or constipation.   Denies dysuria, frequency, hesitancy or incontinence. Denies joint pain, swelling and limitation in mobility. Denies headaches, seizures, numbness, or tingling. Denies depression, anxiety or insomnia. Denies skin break down or rash.   PE  BP 158/90 mmHg  Pulse 84  Resp 18  Ht 5\' 6"  (1.676 m)  Wt 180 lb 1.9 oz (81.702 kg)  BMI 29.09 kg/m2  SpO2 99%  Patient alert and oriented and in no cardiopulmonary distress.  HEENT: No facial asymmetry, EOMI,   oropharynx pink and moist.  Neck supple no JVD, no mass.  Chest: Clear to auscultation bilaterally.  CVS: S1, S2 no murmurs, no S3.Regular rate.  ABD: Soft non tender.   Ext: No edema  MS: Adequate ROM spine, shoulders, hips and knees.  Skin: Intact, no ulcerations or rash noted.  Psych: Good eye contact, normal affect. Memory intact not anxious or depressed appearing.  CNS: CN 2-12 intact, power,  normal throughout.no focal deficits noted.   Assessment & Plan   Essential hypertension Uncontrolled due to non compliance DASH diet and commitment to daily physical activity for a minimum of 30  minutes discussed and encouraged, as a part of hypertension management. The importance of attaining a healthy weight is also discussed.  BP/Weight 04/08/2015 02/26/2014 08/14/2013 06/04/2013 05/28/2013 01/16/2013 04/26/2012  Systolic BP 158 130 132 114 111 152 109  Diastolic BP 90 70 80 64 71 82 68  Wt. (Lbs) 180.12 158.12 162 160.08 160 155.04 150  BMI 29.09 25.53 26.54 26.22 25.84 25.4 23.49        Hyperlipidemia LDL goal <100 Hyperlipidemia:Low fat diet discussed and encouraged.   Lipid Panel  Lab Results  Component Value Date   CHOL 250* 03/04/2014   HDL 106 03/04/2014   LDLCALC 122* 03/04/2014   TRIG 108 03/04/2014   CHOLHDL 2.4 03/04/2014   Updated lab needed at/ before next visit.      Medical non-compliance conntinued issue, re educated and encouraged change in behavior  NICOTINE ADDICTION No change in snuff use, Unwilling to set a quit date Counselled for 3 minutes re need to quit and health benefits which will ensue  Convulsions Seizure free despite not being on any medication for weeks  Needs to resume medication, seizures likely were due to alcohol use in the past        Review of Systems     Objective:   Physical Exam        Assessment & Plan:

## 2015-04-09 ENCOUNTER — Encounter: Payer: Self-pay | Admitting: Family Medicine

## 2015-04-09 NOTE — Assessment & Plan Note (Signed)
Hyperlipidemia:Low fat diet discussed and encouraged.   Lipid Panel  Lab Results  Component Value Date   CHOL 250* 03/04/2014   HDL 106 03/04/2014   LDLCALC 122* 03/04/2014   TRIG 108 03/04/2014   CHOLHDL 2.4 03/04/2014   Updated lab needed at/ before next visit.

## 2015-04-09 NOTE — Assessment & Plan Note (Signed)
Seizure free despite not being on any medication for weeks  Needs to resume medication, seizures likely were due to alcohol use in the past

## 2015-04-09 NOTE — Assessment & Plan Note (Signed)
Uncontrolled due to non compliance DASH diet and commitment to daily physical activity for a minimum of 30 minutes discussed and encouraged, as a part of hypertension management. The importance of attaining a healthy weight is also discussed.  BP/Weight 04/08/2015 02/26/2014 08/14/2013 06/04/2013 05/28/2013 01/16/2013 04/26/2012  Systolic BP 158 130 132 114 111 152 109  Diastolic BP 90 70 80 64 71 82 68  Wt. (Lbs) 180.12 158.12 162 160.08 160 155.04 150  BMI 29.09 25.53 26.54 26.22 25.84 25.4 23.49

## 2015-04-09 NOTE — Assessment & Plan Note (Signed)
conntinued issue, re educated and encouraged change in behavior

## 2015-04-09 NOTE — Assessment & Plan Note (Signed)
No change in snuff use, Unwilling to set a quit date Counselled for 3 minutes re need to quit and health benefits which will ensue

## 2015-04-11 LAB — CBC
HEMATOCRIT: 37.5 % (ref 36.0–46.0)
Hemoglobin: 12.3 g/dL (ref 12.0–15.0)
MCH: 30.8 pg (ref 26.0–34.0)
MCHC: 32.8 g/dL (ref 30.0–36.0)
MCV: 93.8 fL (ref 78.0–100.0)
MPV: 12 fL (ref 8.6–12.4)
Platelets: 292 10*3/uL (ref 150–400)
RBC: 4 MIL/uL (ref 3.87–5.11)
RDW: 15 % (ref 11.5–15.5)
WBC: 8.2 10*3/uL (ref 4.0–10.5)

## 2015-04-12 LAB — COMPREHENSIVE METABOLIC PANEL
ALT: 23 U/L (ref 6–29)
AST: 39 U/L — ABNORMAL HIGH (ref 10–35)
Albumin: 3.8 g/dL (ref 3.6–5.1)
Alkaline Phosphatase: 40 U/L (ref 33–115)
BUN: 9 mg/dL (ref 7–25)
CHLORIDE: 102 mmol/L (ref 98–110)
CO2: 27 mmol/L (ref 20–31)
CREATININE: 0.62 mg/dL (ref 0.50–1.10)
Calcium: 9 mg/dL (ref 8.6–10.2)
GLUCOSE: 82 mg/dL (ref 65–99)
Potassium: 4.3 mmol/L (ref 3.5–5.3)
SODIUM: 137 mmol/L (ref 135–146)
TOTAL PROTEIN: 7.5 g/dL (ref 6.1–8.1)
Total Bilirubin: 0.5 mg/dL (ref 0.2–1.2)

## 2015-04-12 LAB — LIPID PANEL
Cholesterol: 188 mg/dL (ref 125–200)
HDL: 59 mg/dL (ref >=46)
LDL Cholesterol: 115 mg/dL (ref <130)
Total CHOL/HDL Ratio: 3.2 Ratio (ref <=5.0)
Triglycerides: 71 mg/dL (ref <150)
VLDL: 14 mg/dL (ref <30)

## 2015-04-12 LAB — TSH: TSH: 1.114 u[IU]/mL (ref 0.350–4.500)

## 2015-04-12 LAB — HIV ANTIBODY (ROUTINE TESTING W REFLEX): HIV: NONREACTIVE

## 2015-04-16 LAB — PHENYTOIN LEVEL, FREE AND TOTAL
Phenytoin, Free: <0.5 mg/L — ABNORMAL LOW (ref 1.0–2.0)
Phenytoin, Total: <2.5 mg/L — ABNORMAL LOW (ref 10.0–20.0)

## 2015-04-17 ENCOUNTER — Other Ambulatory Visit: Payer: Self-pay

## 2015-04-17 ENCOUNTER — Telehealth: Payer: Self-pay | Admitting: Family Medicine

## 2015-04-17 MED ORDER — PHENYTOIN SODIUM EXTENDED 100 MG PO CAPS: 100.0000 mg | ORAL_CAPSULE | Freq: Every day | ORAL | Status: DC

## 2015-04-17 MED ORDER — PHENYTOIN SODIUM EXTENDED 300 MG PO CAPS: 300.0000 mg | ORAL_CAPSULE | Freq: Every day | ORAL | Status: DC

## 2015-04-17 MED ORDER — PHENYTOIN SODIUM EXTENDED 300 MG PO CAPS: 300.0000 mg | ORAL_CAPSULE | Freq: Three times a day (TID) | ORAL | Status: DC

## 2015-04-17 MED ORDER — BENAZEPRIL HCL 10 MG PO TABS: 10.0000 mg | ORAL_TABLET | Freq: Every day | ORAL | Status: DC

## 2015-04-17 NOTE — Telephone Encounter (Signed)
Med refilled.

## 2015-04-17 NOTE — Telephone Encounter (Signed)
Patient is asking for medication refills on phenytoin (DILANTIN) 300 MG ER capsule and benazepril (LOTENSIN) 10 MG tablet please advise?

## 2015-04-18 NOTE — Addendum Note (Signed)
Addended by: Kandis FantasiaSLADE, COURTNEY B on: 04/18/2015 11:28 AM   Modules accepted: Orders

## 2015-05-30 ENCOUNTER — Encounter: Payer: Self-pay | Admitting: Family Medicine

## 2015-06-09 ENCOUNTER — Encounter: Payer: Self-pay | Admitting: Family Medicine

## 2015-07-15 ENCOUNTER — Encounter: Payer: Self-pay | Admitting: Family Medicine

## 2015-08-05 ENCOUNTER — Other Ambulatory Visit: Payer: Self-pay | Admitting: Family Medicine

## 2015-08-13 ENCOUNTER — Other Ambulatory Visit: Payer: Self-pay | Admitting: Family Medicine

## 2015-09-15 ENCOUNTER — Encounter: Payer: Self-pay | Admitting: Family Medicine

## 2015-09-16 ENCOUNTER — Encounter: Payer: Self-pay | Admitting: Family Medicine

## 2020-03-11 ENCOUNTER — Observation Stay (HOSPITAL_COMMUNITY): Payer: Self-pay

## 2020-03-11 ENCOUNTER — Emergency Department (HOSPITAL_COMMUNITY): Payer: Self-pay

## 2020-03-11 ENCOUNTER — Observation Stay (HOSPITAL_COMMUNITY)
Admit: 2020-03-11 | Discharge: 2020-03-11 | Disposition: A | Discharge status: Home / Self Care | Payer: Self-pay | Attending: Neurology | Admitting: Neurology

## 2020-03-11 ENCOUNTER — Other Ambulatory Visit: Payer: Self-pay

## 2020-03-11 ENCOUNTER — Encounter (HOSPITAL_COMMUNITY): Payer: Self-pay

## 2020-03-11 ENCOUNTER — Inpatient Hospital Stay (HOSPITAL_COMMUNITY)
Admission: EM | Admit: 2020-03-11 | Discharge: 2020-03-13 | DRG: 100 | Disposition: A | Discharge status: Home Health | Payer: Self-pay | Attending: Family Medicine | Admitting: Family Medicine

## 2020-03-11 DIAGNOSIS — Z8719 Personal history of other diseases of the digestive system: Secondary | ICD-10-CM

## 2020-03-11 DIAGNOSIS — T466X6A Underdosing of antihyperlipidemic and antiarteriosclerotic drugs, initial encounter: Secondary | ICD-10-CM | POA: Diagnosis present

## 2020-03-11 DIAGNOSIS — M25532 Pain in left wrist: Secondary | ICD-10-CM | POA: Diagnosis present

## 2020-03-11 DIAGNOSIS — Z79899 Other long term (current) drug therapy: Secondary | ICD-10-CM

## 2020-03-11 DIAGNOSIS — F10929 Alcohol use, unspecified with intoxication, unspecified: Secondary | ICD-10-CM

## 2020-03-11 DIAGNOSIS — G8384 Todd's paralysis (postepileptic): Secondary | ICD-10-CM | POA: Diagnosis present

## 2020-03-11 DIAGNOSIS — Z9119 Patient's noncompliance with other medical treatment and regimen: Secondary | ICD-10-CM

## 2020-03-11 DIAGNOSIS — I10 Essential (primary) hypertension: Secondary | ICD-10-CM | POA: Diagnosis present

## 2020-03-11 DIAGNOSIS — Z91120 Patient's intentional underdosing of medication regimen due to financial hardship: Secondary | ICD-10-CM

## 2020-03-11 DIAGNOSIS — T420X6A Underdosing of hydantoin derivatives, initial encounter: Secondary | ICD-10-CM | POA: Diagnosis present

## 2020-03-11 DIAGNOSIS — E785 Hyperlipidemia, unspecified: Secondary | ICD-10-CM | POA: Diagnosis present

## 2020-03-11 DIAGNOSIS — M25539 Pain in unspecified wrist: Secondary | ICD-10-CM

## 2020-03-11 DIAGNOSIS — R531 Weakness: Principal | ICD-10-CM | POA: Diagnosis present

## 2020-03-11 DIAGNOSIS — R569 Unspecified convulsions: Secondary | ICD-10-CM

## 2020-03-11 DIAGNOSIS — H5509 Other forms of nystagmus: Secondary | ICD-10-CM | POA: Diagnosis present

## 2020-03-11 DIAGNOSIS — G9341 Metabolic encephalopathy: Secondary | ICD-10-CM | POA: Diagnosis present

## 2020-03-11 DIAGNOSIS — D539 Nutritional anemia, unspecified: Secondary | ICD-10-CM | POA: Diagnosis present

## 2020-03-11 DIAGNOSIS — Z9181 History of falling: Secondary | ICD-10-CM

## 2020-03-11 DIAGNOSIS — F10129 Alcohol abuse with intoxication, unspecified: Secondary | ICD-10-CM

## 2020-03-11 DIAGNOSIS — G40909 Epilepsy, unspecified, not intractable, without status epilepticus: Principal | ICD-10-CM | POA: Diagnosis present

## 2020-03-11 DIAGNOSIS — M79602 Pain in left arm: Secondary | ICD-10-CM | POA: Diagnosis present

## 2020-03-11 DIAGNOSIS — F10229 Alcohol dependence with intoxication, unspecified: Secondary | ICD-10-CM | POA: Diagnosis present

## 2020-03-11 DIAGNOSIS — Z20822 Contact with and (suspected) exposure to covid-19: Secondary | ICD-10-CM | POA: Diagnosis present

## 2020-03-11 DIAGNOSIS — T464X6A Underdosing of angiotensin-converting-enzyme inhibitors, initial encounter: Secondary | ICD-10-CM | POA: Diagnosis present

## 2020-03-11 DIAGNOSIS — Y908 Blood alcohol level of 240 mg/100 ml or more: Secondary | ICD-10-CM | POA: Diagnosis present

## 2020-03-11 DIAGNOSIS — W19XXXA Unspecified fall, initial encounter: Secondary | ICD-10-CM

## 2020-03-11 LAB — CBC
HCT: 31.1 % — ABNORMAL LOW (ref 36.0–46.0)
Hemoglobin: 10.2 g/dL — ABNORMAL LOW (ref 12.0–15.0)
MCH: 34.1 pg — ABNORMAL HIGH (ref 26.0–34.0)
MCHC: 32.8 g/dL (ref 30.0–36.0)
MCV: 104 fL — ABNORMAL HIGH (ref 80.0–100.0)
Platelets: 182 10*3/uL (ref 150–400)
RBC: 2.99 MIL/uL — ABNORMAL LOW (ref 3.87–5.11)
RDW: 14.6 % (ref 11.5–15.5)
WBC: 10 10*3/uL (ref 4.0–10.5)
nRBC: 0 % (ref 0.0–0.2)

## 2020-03-11 LAB — COMPREHENSIVE METABOLIC PANEL
ALT: 20 U/L (ref 0–44)
AST: 61 U/L — ABNORMAL HIGH (ref 15–41)
Albumin: 2.2 g/dL — ABNORMAL LOW (ref 3.5–5.0)
Alkaline Phosphatase: 71 U/L (ref 38–126)
Anion gap: 8 (ref 5–15)
BUN: 6 mg/dL (ref 6–20)
CO2: 24 mmol/L (ref 22–32)
Calcium: 8.5 mg/dL — ABNORMAL LOW (ref 8.9–10.3)
Chloride: 111 mmol/L (ref 98–111)
Creatinine, Ser: 0.57 mg/dL (ref 0.44–1.00)
GFR, Estimated: >60 mL/min (ref >60)
Glucose, Bld: 114 mg/dL — ABNORMAL HIGH (ref 70–99)
Potassium: 3.6 mmol/L (ref 3.5–5.1)
Sodium: 143 mmol/L (ref 135–145)
Total Bilirubin: 2.5 mg/dL — ABNORMAL HIGH (ref 0.3–1.2)
Total Protein: 7.3 g/dL (ref 6.5–8.1)

## 2020-03-11 LAB — PHENYTOIN LEVEL, TOTAL: Phenytoin Lvl: <2.5 ug/mL — ABNORMAL LOW (ref 10.0–20.0)

## 2020-03-11 LAB — I-STAT CHEM 8, ED
BUN: 4 mg/dL — ABNORMAL LOW (ref 6–20)
Calcium, Ion: 1.08 mmol/L — ABNORMAL LOW (ref 1.15–1.40)
Chloride: 111 mmol/L (ref 98–111)
Creatinine, Ser: 1 mg/dL (ref 0.44–1.00)
Glucose, Bld: 112 mg/dL — ABNORMAL HIGH (ref 70–99)
HCT: 32 % — ABNORMAL LOW (ref 36.0–46.0)
Hemoglobin: 10.9 g/dL — ABNORMAL LOW (ref 12.0–15.0)
Potassium: 4.5 mmol/L (ref 3.5–5.1)
Sodium: 147 mmol/L — ABNORMAL HIGH (ref 135–145)
TCO2: 27 mmol/L (ref 22–32)

## 2020-03-11 LAB — RESPIRATORY PANEL BY RT PCR (FLU A&B, COVID)
Influenza A by PCR: NEGATIVE
Influenza B by PCR: NEGATIVE
SARS Coronavirus 2 by RT PCR: NEGATIVE

## 2020-03-11 LAB — APTT: aPTT: 45 seconds — ABNORMAL HIGH (ref 24–36)

## 2020-03-11 LAB — ETHANOL: Alcohol, Ethyl (B): 340 mg/dL (ref <10)

## 2020-03-11 LAB — DIFFERENTIAL
Abs Immature Granulocytes: 0.02 10*3/uL (ref 0.00–0.07)
Basophils Absolute: 0.1 10*3/uL (ref 0.0–0.1)
Basophils Relative: 1 %
Eosinophils Absolute: 0 10*3/uL (ref 0.0–0.5)
Eosinophils Relative: 0 %
Immature Granulocytes: 0 %
Lymphocytes Relative: 38 %
Lymphs Abs: 3.8 10*3/uL (ref 0.7–4.0)
Monocytes Absolute: 0.9 10*3/uL (ref 0.1–1.0)
Monocytes Relative: 9 %
Neutro Abs: 5.1 10*3/uL (ref 1.7–7.7)
Neutrophils Relative %: 52 %

## 2020-03-11 LAB — PROTIME-INR
INR: 1.7 — ABNORMAL HIGH (ref 0.8–1.2)
Prothrombin Time: 19.2 seconds — ABNORMAL HIGH (ref 11.4–15.2)

## 2020-03-11 LAB — VITAMIN B12: Vitamin B-12: 713 pg/mL (ref 180–914)

## 2020-03-11 LAB — CK: Total CK: 200 U/L (ref 38–234)

## 2020-03-11 LAB — CBG MONITORING, ED: Glucose-Capillary: 101 mg/dL — ABNORMAL HIGH (ref 70–99)

## 2020-03-11 MED ORDER — ENOXAPARIN SODIUM 40 MG/0.4ML ~~LOC~~ SOLN
40.0000 mg | SUBCUTANEOUS | Status: DC
  Administered 2020-03-11 – 2020-03-12 (×2): 40 mg via SUBCUTANEOUS
  Filled 2020-03-11 (×2): qty 0.4

## 2020-03-11 MED ORDER — ADULT MULTIVITAMIN W/MINERALS CH: 1.0000 | ORAL_TABLET | Freq: Every day | ORAL | Status: DC

## 2020-03-11 MED ORDER — VITAMIN B-12 1000 MCG PO TABS
1000.0000 ug | ORAL_TABLET | Freq: Every day | ORAL | Status: DC
  Administered 2020-03-11 – 2020-03-13 (×3): 1000 ug via ORAL
  Filled 2020-03-11 (×3): qty 1

## 2020-03-11 MED ORDER — PHENYTOIN SODIUM EXTENDED 100 MG PO CAPS
100.0000 mg | ORAL_CAPSULE | Freq: Three times a day (TID) | ORAL | Status: DC
  Administered 2020-03-11 – 2020-03-13 (×6): 100 mg via ORAL
  Filled 2020-03-11 (×6): qty 1

## 2020-03-11 MED ORDER — ACETAMINOPHEN 650 MG RE SUPP: 650.0000 mg | RECTAL | Status: DC | PRN

## 2020-03-11 MED ORDER — ADULT MULTIVITAMIN W/MINERALS CH
1.0000 | ORAL_TABLET | Freq: Every day | ORAL | Status: DC
  Administered 2020-03-12 – 2020-03-13 (×2): 1 via ORAL
  Filled 2020-03-11 (×2): qty 1

## 2020-03-11 MED ORDER — ONDANSETRON HCL 4 MG PO TABS: 4.0000 mg | ORAL_TABLET | Freq: Four times a day (QID) | ORAL | Status: DC | PRN

## 2020-03-11 MED ORDER — LORAZEPAM 2 MG/ML IJ SOLN: 1.0000 mg | INTRAMUSCULAR | Status: DC | PRN

## 2020-03-11 MED ORDER — ACETAMINOPHEN 325 MG PO TABS
650.0000 mg | ORAL_TABLET | ORAL | Status: DC | PRN
  Administered 2020-03-12 (×2): 650 mg via ORAL
  Filled 2020-03-11 (×2): qty 2

## 2020-03-11 MED ORDER — ONDANSETRON HCL 4 MG/2ML IJ SOLN
4.0000 mg | Freq: Four times a day (QID) | INTRAMUSCULAR | Status: DC | PRN
  Administered 2020-03-11: 4 mg via INTRAVENOUS
  Filled 2020-03-11: qty 2

## 2020-03-11 MED ORDER — FOLIC ACID 1 MG PO TABS: 1.0000 mg | ORAL_TABLET | Freq: Every day | ORAL | Status: DC

## 2020-03-11 MED ORDER — FOLIC ACID 1 MG PO TABS
1.0000 mg | ORAL_TABLET | Freq: Every day | ORAL | Status: DC
  Administered 2020-03-12 – 2020-03-13 (×2): 1 mg via ORAL
  Filled 2020-03-11 (×2): qty 1

## 2020-03-11 MED ORDER — OXYCODONE HCL 5 MG PO TABS
5.0000 mg | ORAL_TABLET | Freq: Once | ORAL | Status: AC
  Administered 2020-03-11: 5 mg via ORAL
  Filled 2020-03-11: qty 1

## 2020-03-11 MED ORDER — THIAMINE HCL 100 MG/ML IJ SOLN
500.0000 mg | Freq: Three times a day (TID) | INTRAVENOUS | Status: DC
  Administered 2020-03-11: 500 mg via INTRAVENOUS
  Filled 2020-03-11 (×4): qty 5

## 2020-03-11 MED ORDER — THIAMINE HCL 100 MG/ML IJ SOLN
500.0000 mg | Freq: Three times a day (TID) | INTRAVENOUS | Status: DC
  Administered 2020-03-11: 500 mg via INTRAVENOUS
  Filled 2020-03-11 (×3): qty 5

## 2020-03-11 NOTE — Progress Notes (Signed)
CODE STROKE CT Times 0804 exam started 0805 exam finished 0805 images sent to [facility] 0807 Exam completed in [facility] 0807 [facility] Radiology called

## 2020-03-11 NOTE — Progress Notes (Signed)
EEG complete - results pending 

## 2020-03-11 NOTE — ED Triage Notes (Signed)
Pt brought to ED via [facility] for altered mental status from home. Pt fell out of bed, pt with left sided weakness, slurred speech, left sided facial droop. Pt fell at midnight. Someone last seen her normal at 11/15 at 2000. CBG 106

## 2020-03-11 NOTE — ED Notes (Signed)
Pt taken off monitor for MRI

## 2020-03-11 NOTE — ED Provider Notes (Signed)
[facility] EMERGENCY DEPARTMENT Provider Note   CSN: [id number] Arrival date & time: 03/11/20  0932  An emergency department physician performed an initial assessment on this suspected stroke patient at 46.  History Chief Complaint  Patient presents with  . Altered Mental Status    ANGELLI Mclean is a 54 y.o. female.  Patient found on floor today confused with slurred speech.  She also had left-sided weakness.  Patient has a history of EtOH abuse  The history is provided by the patient and the EMS personnel.  Altered Mental Status Presenting symptoms: behavior changes   Severity:  Moderate Most recent episode:  Today Episode history:  Continuous Timing:  Constant Progression:  Waxing and waning Chronicity:  New Context: alcohol use        Past Medical History:  Diagnosis Date  . Alcohol dependency (HCC)   . Hypertension   . IDA (iron deficiency anemia)    required blood tranfusion  . Nicotine addiction   . Seizure disorder [facility])     Patient Active Problem List   Diagnosis Date Noted  . Seizures (HCC) 03/11/2020  . Hyperlipidemia LDL goal <100 08/16/2013  . Carotid bruit 08/14/2013  . Light headedness 08/14/2013  . Medical non-compliance 01/21/2013  . Vitamin D deficiency 01/16/2013  . Essential hypertension 04/21/2010  . HELICOBACTER PYLORI [H. PYLORI] INFECTION 02/24/2010  . Iron deficiency anemia 12/01/2009  . FATIGUE 11/08/2008  . NICOTINE ADDICTION 11/10/2007  . Convulsions (HCC) 11/10/2007    Past Surgical History:  Procedure Laterality Date  . btl    . left knee surgery, fracture       OB History    Gravida  5   Para  5   Term  5   Preterm      AB      Living  5     SAB      TAB      Ectopic      Multiple      Live Births              Family History  Problem Relation Age of Onset  . Anxiety disorder Sister     Social History   Tobacco Use  . Smoking status: Never Smoker  . Smokeless tobacco: Current User     Types: Snuff  Substance Use Topics  . Alcohol use: Yes    Comment: last drank last night 1 40 oz  . Drug use: No    Home Medications Prior to Admission medications   Medication Sig Start Date End Date Taking? Authorizing Provider  atorvastatin (LIPITOR) 20 MG tablet TAKE 1 TABLET BY MOUTH EVERY DAY Patient taking differently: Take 20 mg by mouth daily.  03/05/14  Yes Kerri Perches, MD  benazepril (LOTENSIN) 10 MG tablet Take 1 tablet (10 mg total) by mouth daily. 04/17/15  Yes Kerri Perches, MD  ergocalciferol (VITAMIN D2) 50000 UNITS capsule Take 1 capsule (50,000 Units total) by mouth once a week. One capsule once weekly Patient taking differently: Take 50,000 Units by mouth once a week.  02/26/14  Yes Kerri Perches, MD  phenytoin (DILANTIN) 100 MG ER capsule TAKE 3 CAPSULES BY MOUTH DAILY Patient taking differently: Take 300 mg by mouth daily.  08/15/15  Yes Kerri Perches, MD  phenytoin (DILANTIN) 300 MG ER capsule Take 1 capsule (300 mg total) by mouth daily. 04/17/15  Yes Kerri Perches, MD    Allergies    Patient has no  known allergies.  Review of Systems   Review of Systems  Unable to perform ROS: Mental status change    Physical Exam Updated Vital Signs BP (!) 145/72   Pulse 60   Temp 97.6 F (36.4 C) (Oral)   Resp 13   Ht 5\' 6"  (1.676 m)   Wt 63.5 kg   SpO2 93%   BMI 22.60 kg/m   Physical Exam Vitals and nursing note reviewed.  Constitutional:      Appearance: She is well-developed.  HENT:     Head: Normocephalic.  Eyes:     General: No scleral icterus.    Conjunctiva/sclera: Conjunctivae normal.  Neck:     Thyroid: No thyromegaly.  Cardiovascular:     Rate and Rhythm: Normal rate and regular rhythm.     Heart sounds: No murmur heard.  No friction rub. No gallop.   Pulmonary:     Breath sounds: No stridor. No wheezing or rales.  Chest:     Chest wall: No tenderness.  Abdominal:     General: There is no distension.      Tenderness: There is no abdominal tenderness. There is no rebound.  Musculoskeletal:     Cervical back: Neck supple.     Comments: Patient had severe weakness to left leg and moderate weakness to left arm patient also had slurred speech  Lymphadenopathy:     Cervical: No cervical adenopathy.  Skin:    Findings: No erythema or rash.  Neurological:     Motor: No abnormal muscle tone.     Coordination: Coordination normal.     Comments: oreinted to person and place  Psychiatric:        Behavior: Behavior normal.     ED Results / Procedures / Treatments   Labs (all labs ordered are listed, but only abnormal results are displayed) Labs Reviewed  ETHANOL - Abnormal; Notable for the following components:      Result Value   Alcohol, Ethyl (B) 340 (*)    All other components within normal limits  PROTIME-INR - Abnormal; Notable for the following components:   Prothrombin Time 19.2 (*)    INR 1.7 (*)    All other components within normal limits  APTT - Abnormal; Notable for the following components:   aPTT 45 (*)    All other components within normal limits  CBC - Abnormal; Notable for the following components:   RBC 2.99 (*)    Hemoglobin 10.2 (*)    HCT 31.1 (*)    MCV 104.0 (*)    MCH 34.1 (*)    All other components within normal limits  COMPREHENSIVE METABOLIC PANEL - Abnormal; Notable for the following components:   Glucose, Bld 114 (*)    Calcium 8.5 (*)    Albumin 2.2 (*)    AST 61 (*)    Total Bilirubin 2.5 (*)    All other components within normal limits  PHENYTOIN LEVEL, TOTAL - Abnormal; Notable for the following components:   Phenytoin Lvl <2.5 (*)    All other components within normal limits  CBG MONITORING, ED - Abnormal; Notable for the following components:   Glucose-Capillary 101 (*)    All other components within normal limits  I-STAT CHEM 8, ED - Abnormal; Notable for the following components:   Sodium 147 (*)    BUN 4 (*)    Glucose, Bld 112 (*)     Calcium, Ion 1.08 (*)    Hemoglobin 10.9 (*)  HCT 32.0 (*)    All other components within normal limits  RESPIRATORY PANEL BY RT PCR (FLU A&B, COVID)  DIFFERENTIAL  VITAMIN B12  RAPID URINE DRUG SCREEN, HOSP PERFORMED  URINALYSIS, ROUTINE W REFLEX MICROSCOPIC  VITAMIN B1  POC URINE PREG, ED    EKG None  Radiology CT CERVICAL SPINE WO CONTRAST  Result Date: 03/11/2020 CLINICAL DATA:  Larey Seat from bed.  Head injury. EXAM: CT CERVICAL SPINE WITHOUT CONTRAST TECHNIQUE: Multidetector CT imaging of the cervical spine was performed without intravenous contrast. Multiplanar CT image reconstructions were also generated. COMPARISON:  None. FINDINGS: Alignment: Normal alignment. Straightening of the cervical lordosis. Skull base and vertebrae: Negative for fracture or focal bone lesion. Soft tissues and spinal canal: Negative for mass or adenopathy. Disc levels: Multilevel disc degeneration and spurring. There is spinal and foraminal stenosis bilaterally at C4-5, C5-6. Upper chest: Lung apices clear bilaterally. Other: None IMPRESSION: Negative for cervical spine fracture Cervical spondylosis C4-5 and C5-6. Electronically Signed   By: Marlan Palau M.D.   On: 03/11/2020 08:26   MR BRAIN WO CONTRAST  Result Date: 03/11/2020 CLINICAL DATA:  Seizure, abnormal neuro exam. Additional history provided: Altered mental status, fall out of bed with left-sided weakness, slurred speech, left-sided facial droop. EXAM: MRI HEAD WITHOUT CONTRAST TECHNIQUE: Multiplanar, multiecho pulse sequences of the brain and surrounding structures were obtained without intravenous contrast. COMPARISON:  Noncontrast head CT performed earlier the same day 03/11/2020. FINDINGS: Brain: The examination is intermittently motion degraded. Most notably, there is moderate motion degradation of the axial T2/FLAIR sequence and moderate/severe motion degradation of the coronal T2/FLAIR and coronal T2 weighted sequences oriented perpendicular  to the long axis of the hippocampi. Redemonstrated age advanced cerebral and cerebellar atrophy. A few small scattered foci of T2/FLAIR hyperintensity within the cerebral white matter are nonspecific, but most commonly second to chronic small vessel ischemia. Within the limitations of motion degradation, no definite hippocampal size or signal discrepancy. There is no acute infarct. No evidence of intracranial mass. No chronic intracranial blood products. No extra-axial fluid collection. No midline shift. Vascular: Expected proximal arterial flow voids. Skull and upper cervical spine: No focal marrow lesion Sinuses/Orbits: Visualized orbits show no acute finding. Minimal ethmoid sinus mucosal thickening. IMPRESSION: Motion degraded examination, as described and limiting evaluation. No evidence of acute intracranial abnormality, including acute infarction. Mild multifocal T2 hyperintense signal changes within the cerebral white matter are nonspecific, but most commonly related to chronic small vessel ischemia. Age-advanced cerebellar and cerebral atrophy. No specific seizure focus is identified. Mild ethmoid sinus mucosal thickening. Electronically Signed   By: Jackey Loge DO   On: 03/11/2020 11:13   DG Chest Port 1 View  Result Date: 03/11/2020 CLINICAL DATA:  Fall EXAM: PORTABLE CHEST 1 VIEW COMPARISON:  May 28, 2013. FINDINGS: Cardiomediastinal silhouette is somewhat accentuated by portable AP technique and low lung volumes. No consolidation. No visible pleural effusions or pneumothorax. No acute osseous abnormality. IMPRESSION: No acute cardiopulmonary disease. Electronically Signed   By: Feliberto Harts MD   On: 03/11/2020 09:34   DG Shoulder Left Portable  Result Date: 03/11/2020 CLINICAL DATA:  Left shoulder pain after fall EXAM: LEFT SHOULDER COMPARISON:  None. FINDINGS: There is no evidence of fracture or dislocation. Minimal arthropathy of the left AC joint. Glenohumeral joint appears within  normal limits on the provided projections. Soft tissues are unremarkable. IMPRESSION: No acute fracture or dislocation, left shoulder. Electronically Signed   By: Duanne Guess D.O.   On: 03/11/2020  09:35   DG Hand Complete Left  Result Date: 03/11/2020 CLINICAL DATA:  Left hand pain after fall EXAM: LEFT HAND - COMPLETE 3+ VIEW COMPARISON:  04/26/2012 FINDINGS: There is no evidence of acute fracture or dislocation. Chronic healed fracture of the left small finger proximal phalanx. Mild arthropathy most pronounced at the first MCP joint and ring finger DIP joint. Soft tissues are unremarkable. IMPRESSION: No acute osseous abnormality, left hand. Electronically Signed   By: Duanne GuessNicholas  Plundo D.O.   On: 03/11/2020 09:38   CT HEAD CODE STROKE WO CONTRAST  Result Date: 03/11/2020 CLINICAL DATA:  Code stroke.  Fall last night.  Slurred speech EXAM: CT HEAD WITHOUT CONTRAST TECHNIQUE: Contiguous axial images were obtained from the base of the skull through the vertex without intravenous contrast. COMPARISON:  03/02/2009 FINDINGS: Brain: No evidence of acute infarction, hemorrhage, hydrocephalus, extra-axial collection or mass lesion/mass effect. Brain atrophy, especially of the cerebellum. There is history of alcohol dependency. Vascular: Atheromatous calcification Skull: Normal. Negative for fracture or focal lesion. Sinuses/Orbits: Negative Other: These results were called by telephone at the time of interpretation on 03/11/2020 at 8:16 am to provider [facility] Pettaway , who verbally acknowledged these results. ASPECTS [facility]([place] Stroke Program Early CT Score) - Ganglionic level infarction (caudate, lentiform nuclei, internal capsule, insula, M1-M3 cortex): 7 - Supraganglionic infarction (M4-M6 cortex): 3 Total score (0-10 with 10 being normal): 10 IMPRESSION: 1. No acute finding 2. Age advanced atrophy, especially of the cerebellum. Electronically Signed   By: Marnee SpringJonathon  Watts M.D.   On: 03/11/2020 08:17     Procedures Procedures (including critical care time)  Medications Ordered in ED Medications  thiamine 500mg  in normal saline (50ml) IVPB (has no administration in time range)  vitamin B-12 (CYANOCOBALAMIN) tablet 1,000 mcg (1,000 mcg Oral Given 03/11/20 [id number])    ED Course  I have reviewed the triage vital signs and the nursing notes.  Pertinent labs & imaging results that were available during my care of the patient were reviewed by me and considered in my medical decision making (see chart for details). CRITICAL CARE Performed by: Bethann BerkshireJoseph Kosta Schnitzler Total critical care time:45 minutes Critical care time was exclusive of separately billable procedures and treating other patients. Critical care was necessary to treat or prevent imminent or life-threatening deterioration. Critical care was time spent personally by me on the following activities: development of treatment plan with patient and/or surrogate as well as nursing, discussions with consultants, evaluation of patient's response to treatment, examination of patient, obtaining history from patient or surrogate, ordering and performing treatments and interventions, ordering and review of laboratory studies, ordering and review of radiographic studies, pulse oximetry and re-evaluation of patient's condition. Code stroke was called.  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  She will be admitted to medicine Final Clinical Impression(s) / ED Diagnoses Final diagnoses:  Weakness    Rx / DC Orders ED Discharge Orders    None       Bethann BerkshireZammit, Alee Katen, MD 03/11/20 1240

## 2020-03-11 NOTE — TOC Initial Note (Addendum)
Transition of Care [facility]) - Initial/Assessment Note    Patient Details  Name: Cynthia Mclean MRN: [id number] Date of Birth: [birth year]  [facility] CM/SW Contact:    Villa Herb, LCSWA Phone Number: 03/11/2020, 8:08 PM  Clinical Narrative:                 Pt admitted for Todd's paralysis. TOC received consult for SA education/resources. CSW visited pt in ED to complete assessment. Pt states she lives alone and can complete her ADLs independently. Pt states she does not drive but her aunt is able to provide her transportation. Pt states she has not used HH services nor does she have DME. CSW asked pt about substance use, pt denies substance use. CSW informed pt that CSW has SA resources to provide if pt was interested. Pt stated she was not interested in any SA resources. TOC to follow for possible d/c needs.   Addendum 11/17 (3:53pm): Pt stated that she does have Medicaid and she has a PCP Dr. Lodema Hong.   Expected Discharge Plan: Home/Self Care Barriers to Discharge: Continued Medical Work up   Patient Goals and CMS Choice Patient states their goals for this hospitalization and ongoing recovery are:: Return home   Choice offered to / list presented to : NA  Expected Discharge Plan and Services Expected Discharge Plan: Home/Self Care In-house Referral: Clinical Social Work Discharge Planning Services: NA   Living arrangements for the past 2 months: Single Family Home                 DME Arranged: N/A DME Agency: NA       HH Arranged: NA HH Agency: NA        Prior Living Arrangements/Services Living arrangements for the past 2 months: Single Family Home Lives with:: Self Patient language and need for interpreter reviewed:: Yes Do you feel safe going back to the place where you live?: Yes            Criminal Activity/Legal Involvement Pertinent to Current Situation/Hospitalization: No - Comment as needed  Activities of Daily Living Home Assistive  Devices/Equipment: None ADL Screening (condition at time of admission) Patient's cognitive ability adequate to safely complete daily activities?: Yes Is the patient deaf or have difficulty hearing?: No Does the patient have difficulty seeing, even when wearing glasses/contacts?: No Does the patient have difficulty concentrating, remembering, or making decisions?: No Patient able to express need for assistance with ADLs?: Yes Does the patient have difficulty dressing or bathing?: No Independently performs ADLs?: Yes (appropriate for developmental age) Does the patient have difficulty walking or climbing stairs?: No Weakness of Legs: None Weakness of Arms/Hands: None  Permission Sought/Granted                  Emotional Assessment Appearance:: Appears stated age Attitude/Demeanor/Rapport: Engaged Affect (typically observed): Quiet, Accepting Orientation: : Oriented to Self, Oriented to Place, Oriented to  Time, Oriented to Situation Alcohol / Substance Use: Alcohol Use Psych Involvement: No (comment)  Admission diagnosis:  Seizures (HCC) [R56.9] Patient Active Problem List   Diagnosis Date Noted  . Seizures (HCC) 03/11/2020  . 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 Alcoholic intoxication with complication (HCC) 03/11/2020  . Alcohol abuse with intoxication (HCC) 03/11/2020  . Hyperlipidemia LDL goal <100 08/16/2013  . Carotid bruit 08/14/2013  . Light headedness 08/14/2013  . Medical non-compliance 01/21/2013  . Vitamin D deficiency 01/16/2013  . Essential hypertension 04/21/2010  . HELICOBACTER PYLORI [H. PYLORI]  INFECTION 02/24/2010  . Iron deficiency anemia 12/01/2009  . FATIGUE 11/08/2008  . NICOTINE ADDICTION 11/10/2007  . Convulsions (HCC) 11/10/2007   PCP:  Patient, No Pcp Per Pharmacy:   [id number] - [place], [place] - [address] SEC [address] [place] [address] Phone: [phone] Fax:  [phone]     Social Determinants of Health (SDOH) Interventions    Readmission Risk Interventions No flowsheet data found.

## 2020-03-11 NOTE — ED Notes (Signed)
Lab attempting to get blood at this time.

## 2020-03-11 NOTE — Consult Note (Addendum)
[facility] Telemedicine Consult  Requesting Provider: Bethann Berkshire Consult Participants: Elease Hashimoto RN Location of the provider: [facility] Location of the patient: Cynthia Mclean ED Room 18  This consult was provided via telemedicine with 2-way video and audio communication. The patient/family was informed that care would be provided in this way and agreed to receive care in this manner.    Chief Complaint: "I fell out of bed"   HPI: Ms. [name] is a 54 y.o. with a past medical history significant for ongoing alcohol use disorder, hypertension, hyperlipidemia, seizures in the setting of alcohol use on phenytoin, poor medication adherence and due to financial concerns, presenting with left-sided weakness.  She reports she went out to a friend's place last night and was drinking there.  Initially reported a 40 ounce beer to ED providers but then endorsed more heavy drinking to me.  She was fine when she got home showered etc. and then woke up on the floor having no memory of how she ended up on the floor, with difficulty moving the left side of her body.  She reports that the difficulty moving her left side has been gradually improving.  Regarding her seizure history, she reports her last seizure was "years ago" and denies ever having had any focal deficits after having a seizure.  She reports she has not taken any of her medications in some time due to financial issues  LKW: 8 PM tpa given?: No, due to out of the window Modified Rankin Scale: 0-Completely asymptomatic and back to baseline post- stroke Time of teleneurologist evaluation: 8:30 AM   Significant delay due to cart malfunction requiring changing carts  Exam: Vitals:   03/11/20 0756 03/11/20 0800  BP: 130/72 125/74  Pulse: 77 68  Resp: 20 13  Temp:    SpO2: 99% 99%    General:  Comfortable, affect is pleasant and cooperative Does seem to have some effort dependence on motor exam where she moves more freely at times  spontaneously than when asked No pain nuchal rigidity on passive range of motion   1A: Level of Consciousness - 0 1B: Ask Month and Age - 0 1C: 'Blink Eyes' & 'Squeeze Hands' - 0 2: Test Horizontal Extraocular Movements - 0 but notable for direction changing nystgamus 3: Test Visual Fields - 0 4: Test Facial Palsy - 0 5A: Test Left Arm Motor Drift - 2 (pain limited, refused to move arm) 5B: Test Right Arm Motor Drift - 0 6A: Test Left Leg Motor Drift - 2 (pain limited  6B: Test Right Leg Motor Drift - 1 7: Test Limb Ataxia - 2 8: Test Sensation - 1 -- left sided numbness 9: Test Language/Aphasia- 0 10: Test Dysarthria - 0 11: Test Extinction/Inattention - 0 NIHSS score: 8   Imaging Reviewed:   Head CT without acute process, cervical spine CT with some cervical stenosis  Labs reviewed in epic and pertinent values follow: Blood alcohol level greater than 300, glucose in the 100s, INR 1.7, AST 61, ALT 20 CBC notable for macrocytic anemia  Assessment: This is a 54 year old woman presenting with acute onset left-sided weakness that is gradually improving.  Most likely etiology is seizure in the setting of alcohol use, though typically would expect this to be more generalized in presentation.  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  However a stuttering lacunar stroke is on the differential given her small vessel risk factors.  We  will therefore obtain an MRI to clarify.  She does not have evidence of large vessel occlusion on examination therefore she is not an interventional thrombectomy candidate, and she is out of the tPA window.  Her exam is also notable for direction changing nystagmus, the differential includes phenytoin toxicity, Wernicke's, or acute alcohol intoxication.  Therefore I will check thiamine levels, phenytoin levels, in addition to alcohol level already ordered by ED provider.  Pending return  of thiamine, patient should be empirically treated with high-dose IV thiamine  Recommendations:  -Phenytoin level resulted, undetectable  -Thiamine level, B12 level -B12 1000 mcg oral daily for empiric repletion given macrocytic anemia -Thiamine 500 mg IV every 8 hours; may be changed to standard oral repletion if thiamine level returns within normal limits -Management of acute alcohol intoxication and withdrawal per ED provider; given seizure history recommend inpatient detox for which patient seems willing -Alcohol use disorder counseling -MRI brain to assess for structural lesion or acute stroke; will need admission for stroke workup only if positive for stroke -Routine EEG given focality of examination -Given focality on neurological examination, would continue home dose of phenytoin at this time, no need to load patient given no concern for ongoing seizures  This patient is receiving care for possible acute neurological changes. There was 55 minutes of care by this provider at the time of service, including time for direct evaluation via telemedicine, review of medical records, imaging studies and discussion of findings with providers, the patient and/or family.  Brooke Dare MD-PhD [facility] [phone]   Addendum -- MRI brain personally reviewed, no acute infarct, motion limited study in many sequences as described by radiology. If patient's left sided deficits persist without etiology identified on x-rays etc, please consider MRI C-spine when she is better able to tolerate lying still for scan.

## 2020-03-11 NOTE — ED Notes (Signed)
Pt back from CT

## 2020-03-11 NOTE — H&P (Addendum)
History and Physical  Cynthia Mclean HKV:[id number] DOB: 21-May-1965 DOA: 03/11/2020  PCP: Patient, No Pcp Per   Chief Complaint: left arm pain, weakness  HPI:  54 year old woman PMH seizure disorder, alcohol dependency presenting to the emergency department after waking up this morning with severe left arm pain, weakness and left leg weakness.  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  Patient does not remember what happened.  She lives at home with her boyfriend.  Her grandchild was over.  She reports having had a couple drinks of alcohol and went to bed around 930.  She woke up this morning on the floor in her bedroom.  She had severe left arm pain and left arm and left leg weakness.  No specific aggravating or alleviating factors noted.  No other associated symptoms reported.  Chart review: . Last seen by PCP 2016 at which time she was noncompliant with antiepileptic.  Last hospitalized 2004 when she presented with seizures, alcoholic pancreatitis, gastritis.  ED Course: Seen by teleneurology with imaging work-up recommended, ultimate recommendations for resuming antiepileptic, empiric thiamine and monitoring.  Review of Systems:  Negative for fever, visual changes, sore throat, rash,  chest pain, shortness of breath, dysuria, bleeding, nausea, vomiting, abdominal pain, diarrhea.  Positive for severe pain left arm.  PMH . Alcohol dependency . Seizure disorder . Remainder reviewed in Epic  PSH . Bilateral tubal ligation . Left knee surgery . Remainder reviewed in Epic  Family history includes: . Sister with anxiety disorder  Social History . Non-smoker, no drugs . "1-2" drinks per day  Allergies . None   Meds include: . Not taking any medications right now.  Previously on Dilantin.  Physicial Exam   Vitals:  . Temperature 97.6, 14, 69,  121/72, 95% on room air  Constitutional:   . Appears calm and comfortable Eyes:  . pupils and irises appear normal . Normal lids  ENMT:  . grossly normal hearing  . Lips appear normal Neck:  . neck appears grossly normal Respiratory:  . CTA bilaterally, no w/r/r.  . Respiratory effort normal. Cardiovascular:  . RRR, no m/r/g . No LE extremity edema   Abdomen:  . Soft, nontender, nondistended . Soft umbilical hernia easily reduced Musculoskeletal:  . Right upper and right lower extremities move with grossly normal tone and strength . Holding left upper extremity with elbow and wrist flexed.  Severe pain with palpation of the hand and wrist.  Initially unwilling to move arm but with assistance was able to extend elbow close to 180 degrees and wrist movement improved.  No swelling.  No tenderness in the shoulder or upper arm. . Left leg, able to flex knee 45 degrees, not clear full effort, able to lift off bed but weaker than right Skin:  . No rashes, lesions, ulcers . palpation of skin: no induration or nodules Neurologic:  . CN 2-12 appear grossly intact. Speech slightly slurred . Sensation all 4 extremities intact Psychiatric:  . Mental status o Mood, affect appropriate o Orientation to self, location, month, day, year  I have personally reviewed following labs and imaging studies  Labs:  . Ethyl alcohol 340 BMP unremarkable . AST 61, total bilirubin 2.5 . Vitamin B12 within normal limits . Hemoglobin 10.2, MCV 104 . INR 1.7 . Phenytoin level undetectable . SARS-CoV-2 negative  Imaging studies:   MRI brain no acute abnormalities.  Age  advanced cerebellar and cerebral atrophy.  No seizure focus identified.  CT head and neck no acute abnormalities  Shoulder and left hand films negative for acute abnormalities  Chest x-ray no acute disease  Medical tests:   EKG independently reviewed: Poor quality.  Sinus rhythm.  No acute changes.  ASSESSMENT/PLAN  Left  upper extremity pain, weakness, left lower extremity weakness in the context of fall at home, alcohol intoxication, known seizure disorder and noncompliance with antiepileptics. 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  No large vessel occlusion.  Not a candidate for TPA regardless presenting outside of the window. --Nystagmus per neurology consultation, differential includes Wernicke's or acute alcohol intoxication.  Suspect alcohol intoxication but treat empirically with thiamine and follow-up thiamine level. --If left-sided deficits persist consider MRI C-spine.  However based on my exam at this point with assistance she had fair range of movement and strength in the left arm and left leg and her history and exam is most consistent with Todd's paralysis. --PT, OT consults  Seizure disorder, noncompliance --Resume Dilantin no load needed.  As needed Ativan. --Routine EEG  Fall at home --only complaint is left arm. No apparent acute injury. Check CK.  Alcohol intoxication, alcohol abuse vs. dependence --Supportive care --Monitor for withdrawal but hold off for now on CIWA --TOC consultation  DVT prophylaxis: enoxaparin Code Status: Full Family Communication: none Consults called: teleneurology    Time spent: 60 minutes  Brendia Sacks, MD  [facility] Direct contact: see [email or web address]  7PM-7AM contact night coverage as below   1. Check the care team in [facility] and look for a) attending/consulting [facility] provider listed and b) the [facility] team listed 2. Log into [email or web address] and use [facility]'s universal password to access. If you do not have the password, please contact the hospital operator. 3. Locate the [facility] provider you are looking for under [facility] and page to a number that you can be directly reached. 4. If you still have difficulty reaching the provider, please page the [facility] (Director on Call) for the Hospitalists listed on amion for assistance.  Severity of Illness: The  appropriate patient status for this patient is OBSERVATION. Observation status is judged to be reasonable and necessary in order to provide the required intensity of service to ensure the patient's safety. The patient's presenting symptoms, physical exam findings, and initial radiographic and laboratory data in the context of their medical condition is felt to place them at decreased risk for further clinical deterioration. Furthermore, it is anticipated that the patient will be medically stable for discharge from the hospital within 2 midnights of admission. The following factors support the patient status of observation.   " The patient's presenting symptoms include left arm pain, left arm and left leg weakness. " The physical exam findings include left arm and left leg weakness; left wrist pain. " The initial radiographic and laboratory data are notable for elevated ethanol.    Status is: Observation  The patient remains OBS appropriate and will d/c before 2 midnights.  Dispo: The patient is from: Home              Anticipated d/c is to: Home              Anticipated d/c date is: 1 day              Patient currently is not medically stable to d/c.    03/11/2020, 3:11 PM   Principal Problem:   Todd's paralysis (HCC) Active Problems:  Medical non-compliance   Seizures (HCC)   Alcoholic intoxication with complication (HCC)   Alcohol abuse with intoxication (HCC)

## 2020-03-11 NOTE — Procedures (Signed)
Patient Name: Cynthia Mclean  MRN: [id number]  Epilepsy Attending: Charlsie Quest  Referring Physician/Provider: Dr Brooke Dare Date: 03/11/2020 Duration: 25.59 mins  Patient history: 54 year old female presented with acute onset left-sided weakness.  EEG to evaluate for seizures.  Level of alertness: Awake, asleep  AEDs during EEG study: None  Technical aspects: This EEG study was done with scalp electrodes positioned according to the 10-20 International system of electrode placement. Electrical activity was acquired at a sampling rate of 500Hz  and reviewed with a high frequency filter of 70Hz  and a low frequency filter of 1Hz . EEG data were recorded continuously and digitally stored.   Description: The posterior dominant rhythm consists of 8-9 Hz activity of moderate voltage (25-35 uV) seen predominantly in posterior head regions, symmetric and reactive to eye opening and eye closing. Sleep was characterized by vertex waves, sleep spindles (12 to 14 Hz), maximal frontocentral region.    Physiologic photic driving was not seen during photic stimulation.  Hyperventilation was not performed.     IMPRESSION: This study is within normal limits. No seizures or epileptiform discharges were seen throughout the recording.  Ashyra Cantin 

## 2020-03-11 NOTE — ED Notes (Signed)
Date and time results received: 03/11/20 0845(use smartphrase ".now" to insert current time)  Test: Ethanol Critical Value: 340 Name of Provider Notified: Dr Estell Harpin  Orders Received? Or Actions Taken?: NA

## 2020-03-11 NOTE — ED Notes (Signed)
Decision made by tele neuro, not code stroke.

## 2020-03-12 DIAGNOSIS — G9341 Metabolic encephalopathy: Secondary | ICD-10-CM | POA: Diagnosis present

## 2020-03-12 LAB — COMPREHENSIVE METABOLIC PANEL
ALT: 19 U/L (ref 0–44)
AST: 56 U/L — ABNORMAL HIGH (ref 15–41)
Albumin: 2 g/dL — ABNORMAL LOW (ref 3.5–5.0)
Alkaline Phosphatase: 56 U/L (ref 38–126)
Anion gap: 7 (ref 5–15)
BUN: 7 mg/dL (ref 6–20)
CO2: 26 mmol/L (ref 22–32)
Calcium: 8.5 mg/dL — ABNORMAL LOW (ref 8.9–10.3)
Chloride: 107 mmol/L (ref 98–111)
Creatinine, Ser: 0.52 mg/dL (ref 0.44–1.00)
GFR, Estimated: >60 mL/min (ref >60)
Glucose, Bld: 92 mg/dL (ref 70–99)
Potassium: 3.6 mmol/L (ref 3.5–5.1)
Sodium: 140 mmol/L (ref 135–145)
Total Bilirubin: 3.5 mg/dL — ABNORMAL HIGH (ref 0.3–1.2)
Total Protein: 6.9 g/dL (ref 6.5–8.1)

## 2020-03-12 LAB — CBC
HCT: 30.7 % — ABNORMAL LOW (ref 36.0–46.0)
Hemoglobin: 10.3 g/dL — ABNORMAL LOW (ref 12.0–15.0)
MCH: 34.1 pg — ABNORMAL HIGH (ref 26.0–34.0)
MCHC: 33.6 g/dL (ref 30.0–36.0)
MCV: 101.7 fL — ABNORMAL HIGH (ref 80.0–100.0)
Platelets: 172 10*3/uL (ref 150–400)
RBC: 3.02 MIL/uL — ABNORMAL LOW (ref 3.87–5.11)
RDW: 14.6 % (ref 11.5–15.5)
WBC: 8 10*3/uL (ref 4.0–10.5)
nRBC: 0 % (ref 0.0–0.2)

## 2020-03-12 LAB — PHOSPHORUS: Phosphorus: 2.8 mg/dL (ref 2.5–4.6)

## 2020-03-12 LAB — MAGNESIUM: Magnesium: 1.4 mg/dL — ABNORMAL LOW (ref 1.7–2.4)

## 2020-03-12 MED ORDER — LORAZEPAM 1 MG PO TABS: 1.0000 mg | ORAL_TABLET | ORAL | Status: DC | PRN

## 2020-03-12 MED ORDER — LORAZEPAM 2 MG/ML IJ SOLN: 1.0000 mg | INTRAMUSCULAR | Status: DC | PRN

## 2020-03-12 MED ORDER — DIAZEPAM 2 MG PO TABS
2.0000 mg | ORAL_TABLET | Freq: Three times a day (TID) | ORAL | Status: DC
  Administered 2020-03-12 – 2020-03-13 (×4): 2 mg via ORAL
  Filled 2020-03-12 (×4): qty 1

## 2020-03-12 MED ORDER — LABETALOL HCL 5 MG/ML IV SOLN: 10.0000 mg | INTRAVENOUS | Status: DC | PRN

## 2020-03-12 MED ORDER — THIAMINE HCL 100 MG PO TABS
100.0000 mg | ORAL_TABLET | Freq: Every day | ORAL | Status: DC
  Administered 2020-03-12 – 2020-03-13 (×2): 100 mg via ORAL
  Filled 2020-03-12 (×2): qty 1

## 2020-03-12 MED ORDER — THIAMINE HCL 100 MG/ML IJ SOLN: 100.0000 mg | Freq: Every day | INTRAMUSCULAR | Status: DC

## 2020-03-12 MED ORDER — AMLODIPINE BESYLATE 5 MG PO TABS
5.0000 mg | ORAL_TABLET | Freq: Every day | ORAL | Status: DC
  Administered 2020-03-12 – 2020-03-13 (×2): 5 mg via ORAL
  Filled 2020-03-12 (×2): qty 1

## 2020-03-12 NOTE — Evaluation (Addendum)
Physical Therapy Evaluation Patient Details Name: Cynthia Mclean MRN: [id number] DOB: [birth year] Today's Date: 03/12/2020   History of Present Illness  54 year old woman PMH seizure disorder, alcohol dependency presenting to the emergency department after waking up this morning with severe left arm pain, weakness and left leg weakness.  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    Clinical Impression  Patient has difficulty using LUE due to increased wrist/hand pain requiring Max/mod assist for sitting up at bedside, very unsteady on feet, at high risk for falls and limited to a few unsteady labored steps at bedside with poor tolerance for weightbearing on LUE.  Patient tolerated sitting up in chair after therapy - RN notified.  Patient will benefit from continued physical therapy in hospital and recommended venue below to increase strength, balance, endurance for safe ADLs and gait.     Follow Up Recommendations CIR    Equipment Recommendations  Rolling walker with 5" wheels    Recommendations for Other Services       Precautions / Restrictions Precautions Precautions: Fall Restrictions Weight Bearing Restrictions: No      Mobility  Bed Mobility Overal bed mobility: Needs Assistance Bed Mobility: Supine to Sit     Supine to sit: Mod assist;Max assist     General bed mobility comments: labored movement, unable to use LUE due to pain, increased time    Transfers Overall transfer level: Needs assistance Equipment used: Rolling walker (2 wheeled) Transfers: Sit to/from [employer] Sit to Stand: Mod assist Stand pivot transfers: Mod assist       General transfer comment: increased time, labored movement  Ambulation/Gait Ambulation/Gait assistance: Mod assist;Max assist Gait Distance (Feet): 4 Feet Assistive device:  Rolling walker (2 wheeled) Gait Pattern/deviations: Decreased step length - right;Decreased step length - left;Decreased stride length;Decreased stance time - left Gait velocity: slow   General Gait Details: limited to 4-5 slow labored unsteady steps with frequent near loss of balance due to weakness and left sided pain, poor grip strength for holding onto RW with left hand  Stairs            Wheelchair Mobility    Modified Rankin (Stroke Patients Only)       Balance Overall balance assessment: Needs assistance Sitting-balance support: Feet supported;No upper extremity supported Sitting balance-Leahy Scale: Fair Sitting balance - Comments: seated at EOB   Standing balance support: During functional activity;Bilateral upper extremity supported Standing balance-Leahy Scale: Poor Standing balance comment: using RW, difficulty holding onto RW with left hand due to weakness/pain                             Pertinent Vitals/Pain Pain Assessment: 0-10 Pain Score: 9  Pain Location: left wrist Pain Descriptors / Indicators: Grimacing;Guarding;Throbbing;Sore Pain Intervention(s): Limited activity within patient's tolerance;Monitored during session;Repositioned    Home Living Family/patient expects to be discharged to:: Private residence Living Arrangements: Other relatives;Non-relatives/Friends Available Help at Discharge: Family;Available PRN/intermittently Type of Home: House Home Access: Level entry     Home Layout: One level Home Equipment: Grab bars - tub/shower;None      Prior Function Level of Independence: Independent         Comments: Pt is independent in mobility, ADLs, works at homeless shelter 6 days/week. Does not drive     Hand Dominance   Dominant Hand: Right  Extremity/Trunk Assessment   Upper Extremity Assessment Upper Extremity Assessment: Defer to OT evaluation RUE Deficits / Details: 4/5 strength throughout LUE Deficits /  Details: ROM is WFL, strength 2/5 throughout LUE Sensation: WNL LUE Coordination: decreased fine motor;decreased gross motor    Lower Extremity Assessment Lower Extremity Assessment: Generalized weakness;RLE deficits/detail;LLE deficits/detail RLE Deficits / Details: grossly 4/5 RLE Sensation: WNL RLE Coordination: WNL LLE Deficits / Details: grossly -4/5 LLE: Unable to fully assess due to pain LLE Sensation: WNL LLE Coordination: WNL    Cervical / Trunk Assessment Cervical / Trunk Assessment: Normal  Communication   Communication: No difficulties  Cognition Arousal/Alertness: Awake/alert Behavior During Therapy: WFL for tasks assessed/performed Overall Cognitive Status: Within Functional Limits for tasks assessed                                        General Comments      Exercises     Assessment/Plan    PT Assessment Patient needs continued PT services  PT Problem List Decreased strength;Decreased activity tolerance;Decreased balance;Decreased mobility       PT Treatment Interventions DME instruction;Gait training;Stair training;Functional mobility training;Therapeutic activities;Therapeutic exercise;Patient/family education;Balance training    PT Goals (Current goals can be found in the Care Plan section)  Acute Rehab PT Goals Patient Stated Goal: To go home PT Goal Formulation: With patient Time For Goal Achievement: 04/02/20 Potential to Achieve Goals: Good    Frequency Min 3X/week   Barriers to discharge        Co-evaluation               AM-PAC PT "6 Clicks" Mobility  Outcome Measure Help needed turning from your back to your side while in a flat bed without using bedrails?: A Lot Help needed moving from lying on your back to sitting on the side of a flat bed without using bedrails?: A Lot Help needed moving to and from a bed to a chair (including a wheelchair)?: A Lot Help needed standing up from a chair using your arms  (e.g., wheelchair or bedside chair)?: A Lot Help needed to walk in hospital room?: A Lot Help needed climbing 3-5 steps with a railing? : Total 6 Click Score: 11    End of Session   Activity Tolerance: Patient tolerated treatment well;Patient limited by fatigue;Patient limited by pain Patient left: in chair;with call bell/phone within reach;with chair alarm set Nurse Communication: Mobility status PT Visit Diagnosis: Unsteadiness on feet (R26.81);Other abnormalities of gait and mobility (R26.89);Muscle weakness (generalized) (M62.81)    Time: 8299-3716 PT Time Calculation (min) (ACUTE ONLY): 30 min   Charges:   PT Evaluation $PT Eval Moderate Complexity: 1 Mod PT Treatments $Therapeutic Activity: 23-37 mins        10:49 AM, 03/12/20 Ocie Bob, MPT Physical Therapist with [facility] 336 [phone] office [phone] mobile phone

## 2020-03-12 NOTE — Plan of Care (Signed)
  Problem: Acute Rehab PT Goals(only PT should resolve) Goal: Pt Will Go Supine/Side To Sit Outcome: Progressing Flowsheets (Taken 03/12/2020 1103) Pt will go Supine/Side to Sit:  with minimal assist  with moderate assist Goal: Patient Will Transfer Sit To/From Stand Outcome: Progressing Flowsheets (Taken 03/12/2020 1103) Patient will transfer sit to/from stand: with minimal assist Goal: Pt Will Transfer Bed To Chair/Chair To Bed Outcome: Progressing Flowsheets (Taken 03/12/2020 1103) Pt will Transfer Bed to Chair/Chair to Bed:  with min assist  with mod assist Goal: Pt Will Ambulate Outcome: Progressing Flowsheets (Taken 03/12/2020 1103) Pt will Ambulate:  25 feet  with moderate assist  with minimal assist  with rolling walker   11:11 AM, 03/12/20 Ocie Bob, MPT Physical Therapist with [facility] 336 [phone] office [phone] mobile phone

## 2020-03-12 NOTE — Plan of Care (Signed)
  Problem: Safety: Goal: Verbalization of understanding the information provided will improve Outcome: Progressing   Problem: Education: Goal: Expressions of having a comfortable level of knowledge regarding the disease process will increase Outcome: Progressing

## 2020-03-12 NOTE — Evaluation (Signed)
Occupational Therapy Evaluation Patient Details Name: Cynthia Mclean MRN: [id number] DOB: 1965-10-31 Today's Date: 03/12/2020    History of Present Illness 54 year old woman PMH seizure disorder, alcohol dependency presenting to the emergency department after waking up this morning with severe left arm pain, weakness and left leg weakness.  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   Clinical Impression   Pt agreeable to OT evaluation. Pt reporting severe LUE pain, with slow ataxic like active movements. Pt allowing for OT to complete P/ROM with pt tolerating full ROM, pain decreasing with repetitions. Pt then able to use RUE to assist LUE for AA/ROM for shoulder flexion. Pt unable to actively extend digits, minimal flexion for functional grasp. Pt is independent at baseline, requiring significant assistance with ADLs at this time due to LUE/LLE pain and weakness. Recommend SNF at discharge to improve safety and independence in ADLs. Will continue to follow while in acute care.     Follow Up Recommendations  SNF    Equipment Recommendations  None recommended by OT       Precautions / Restrictions Precautions Precautions: Fall Restrictions Weight Bearing Restrictions: No      Mobility Bed Mobility               General bed mobility comments: Defer to PT     Transfers                 General transfer comment: Defer to PT        ADL either performed or assessed with clinical judgement   ADL Overall ADL's : Needs assistance/impaired Eating/Feeding: Minimal assistance;Bed level Eating/Feeding Details (indicate cue type and reason): Pt requiring assist for removing paper from straw. OT assisting to hold straw with left hand and pt peeling paper with right. Min assist to put straw in cup. Pt was able to hold cup with right hand to  drink Grooming: Minimal assistance;Bed level               Lower Body Dressing: Maximal assistance;Bed level Lower Body Dressing Details (indicate cue type and reason): pt unable to donn socks due to decreased pain and mobility               General ADL Comments: Pt requiring increased assistance for ADLs due to left side pain and weakness limiting mobility      Vision Baseline Vision/History: No visual deficits Patient Visual Report: No change from baseline Vision Assessment?: No apparent visual deficits            Pertinent Vitals/Pain Pain Assessment: 0-10 Pain Score: 7  Pain Location: left arm Pain Descriptors / Indicators: Grimacing;Guarding;Throbbing Pain Intervention(s): Limited activity within patient's tolerance;Monitored during session;Repositioned     Hand Dominance Right   Extremity/Trunk Assessment Upper Extremity Assessment Upper Extremity Assessment: RUE deficits/detail;LUE deficits/detail RUE Deficits / Details: 4/5 strength throughout LUE Deficits / Details: ROM is WFL, strength 2/5 throughout LUE Sensation: WNL LUE Coordination: decreased fine motor;decreased gross motor   Lower Extremity Assessment Lower Extremity Assessment: Defer to PT evaluation       Communication Communication Communication: No difficulties   Cognition Arousal/Alertness: Awake/alert Behavior During Therapy: WFL for tasks assessed/performed Overall Cognitive Status: Within Functional Limits for tasks assessed  Home Living Family/patient expects to be discharged to:: Private residence Living Arrangements: Other relatives (6 y/o granddaughter) Available Help at Discharge: Family;Available PRN/intermittently Type of Home: House Home Access: Level entry     Home Layout: One level     Bathroom Shower/Tub: Chief Strategy Officer: Standard     Home Equipment: None          Prior  Functioning/Environment Level of Independence: Independent        Comments: Pt is independent in mobility, ADLs, works at [employer] 6 days/week. Does not drive        OT Problem List: Decreased strength;Decreased activity tolerance;Impaired balance (sitting and/or standing);Decreased coordination;Decreased knowledge of use of DME or AE;Impaired UE functional use;Pain      OT Treatment/Interventions: Self-care/ADL training;Therapeutic exercise;Neuromuscular education;DME and/or AE instruction;Therapeutic activities;Patient/family education    OT Goals(Current goals can be found in the care plan section) Acute Rehab OT Goals Patient Stated Goal: To go home OT Goal Formulation: With patient Time For Goal Achievement: 03/26/20 Potential to Achieve Goals: Good  OT Frequency: Min 2X/week   Barriers to D/C: Decreased caregiver support             AM-PAC OT "6 Clicks" Daily Activity     Outcome Measure Help from another person eating meals?: A Lot Help from another person taking care of personal grooming?: A Lot Help from another person toileting, which includes using toliet, bedpan, or urinal?: A Lot Help from another person bathing (including washing, rinsing, drying)?: A Lot Help from another person to put on and taking off regular upper body clothing?: A Lot Help from another person to put on and taking off regular lower body clothing?: A Lot 6 Click Score: 12   End of Session Equipment Utilized During Treatment: Oxygen  Activity Tolerance: Patient tolerated treatment well Patient left: in bed;with call bell/phone within reach;with family/visitor present  OT Visit Diagnosis: Muscle weakness (generalized) (M62.81);Repeated falls (R29.6);Pain Pain - Right/Left: Left Pain - part of body: Shoulder;Arm;Hand                Time: 0160-1093 OT Time Calculation (min): 38 min Charges:  OT General Charges $OT Visit: 1 Visit OT Evaluation $OT Eval Low Complexity: 1 Low OT  Treatments $Therapeutic Exercise: 8-22 mins   Ezra Sites, OTR/L  [phone] 03/12/2020, 8:24 AM

## 2020-03-12 NOTE — TOC Progression Note (Signed)
Transition of Care [facility]) - Progression Note    Patient Details  Name: Cynthia Mclean MRN: [id number] Date of Birth: 02-23-1966  [facility] CM/SW Contact  Barry Brunner, LCSW Phone Number: 03/12/2020, 4:01 PM  Clinical Narrative:    CSW contacted registration for patient's medicaid number. Registration reported that patient only had family planning medicaid # [id number]. CSW consulted Fayrene Fearing with PT. Fayrene Fearing reported that he will consult with OT for CIR referral on 11/18. Jessica willing to provide LOG for patient if CIR not able to accept patient. TOC to follow.    Expected Discharge Plan: Home/Self Care Barriers to Discharge: Continued Medical Work up  Expected Discharge Plan and Services Expected Discharge Plan: Home/Self Care In-house Referral: Clinical Social Work Discharge Planning Services: NA   Living arrangements for the past 2 months: Single Family Home                 DME Arranged: N/A DME Agency: NA       HH Arranged: NA HH Agency: NA         Social Determinants of Health (SDOH) Interventions    Readmission Risk Interventions No flowsheet data found.

## 2020-03-12 NOTE — Plan of Care (Signed)
  Problem: Acute Rehab OT Goals (only OT should resolve) Goal: Pt. Will Perform Grooming Flowsheets (Taken 03/12/2020 0827) Pt Will Perform Grooming:  with supervision  standing Goal: Pt. Will Perform Upper Body Dressing Flowsheets (Taken 03/12/2020 0827) Pt Will Perform Upper Body Dressing:  with supervision  sitting Goal: Pt. Will Transfer To Toilet Flowsheets (Taken 03/12/2020 0827) Pt Will Transfer to Toilet:  with min guard assist  ambulating  regular height toilet Goal: Pt. Will Perform Toileting-Clothing Manipulation Flowsheets (Taken 03/12/2020 0827) Pt Will Perform Toileting - Clothing Manipulation and hygiene:  with supervision  sitting/lateral leans  sit to/from stand Goal: Pt/Caregiver Will Perform Home Exercise Program Flowsheets (Taken 03/12/2020 0827) Pt/caregiver will Perform Home Exercise Program:  Increased strength  Left upper extremity  Independently  With written HEP provided

## 2020-03-12 NOTE — Progress Notes (Signed)
Patient Demographics:    Cynthia Mclean, is a 54 y.o. female, DOB - 02/19/1966, ZHY:[id number]  Admit date - 03/11/2020   Admitting Physician Mansour Balboa Mariea ClontsEmokpae, MD  Outpatient Primary MD for the patient is Kerri PerchesSimpson, Margaret E, MD  LOS - 0   Chief Complaint  Patient presents with  . 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 -Left-sided weakness persist -History of noncompliance with Dilantin -Pharmacy consult for Dilantin dosing appreciated -Last seen by PCP 2016 at which time she was noncompliant with antiepileptic.  Last hospitalized 2004 when she presented with seizures, alcoholic pancreatitis, gastritis.  2) acute alcohol intoxication and fall and seizures--- patient is not interested in alcohol rehab -Blood alcohol level was 340 on admission -CK is not elevated -Continue lorazepam per CIWA protocol, -Continue multivitamin, folic acid and thiamine  3) history of seizure disorder--- notoriously noncompliant please see #1 above  4)HTN--Limited BP noted, start amlodipine 5 mg daily, may use IV labetalol as needed for elevated BP  5) falls generalized weakness and left-sided  weakness--- PT OT eval appreciated recommends SNF rehab, --Patient is leaning towards going home with home health instead -Patient remains very weak on the left side risk for fall and injury, unsafe discharge home at this time  Disposition/Need for in-Hospital Stay- patient unable to be discharged at this time due to - PT OT eval appreciated recommends SNF rehab, --Patient is leaning towards going home with home health instead -Patient remains very weak on the left side risk for fall and injury, unsafe discharge home at this time  Status is: Inpatient  Remains inpatient appropriate because: PT OT eval appreciated recommends SNF rehab,   Disposition: The patient is from: Home              Anticipated d/c is to: SNF              Anticipated d/c date is: 2 days              Patient currently is not medically stable to d/c. Barriers: Not Clinically Stable- -  Code Status :  Full code  Family Communication:    (patient is alert, awake and coherent)   Consults  : neuro  DVT Prophylaxis  :  Lovenox   - SCDs    Lab Results  Component Value Date   PLT 182 03/11/2020    Inpatient Medications  Scheduled Meds: . diazepam  2 mg Oral TID  . enoxaparin (LOVENOX) injection  40 mg Subcutaneous Q24H  . folic acid  1  mg Oral Daily  . multivitamin with minerals  1 tablet Oral Daily  . phenytoin  100 mg Oral TID  . vitamin B-12  1,000 mcg Oral Daily   Continuous Infusions: PRN Meds:.acetaminophen **OR** acetaminophen, LORazepam **OR** LORazepam, ondansetron **OR** ondansetron (ZOFRAN) IV   Anti-infectives (From admission, onward)   None       Objective:   Vitals:   03/11/20 2200 03/11/20 2300 03/12/20 0305 03/12/20 1601  BP: 107/66 138/73 136/71 (!) 151/70  Pulse: 76 84 78 73  Resp: 11 18 18 16   Temp:  98.2 F (36.8 C) 97.9 F (36.6 C) 98.3 F (36.8 C)  TempSrc:  Oral Oral Oral  SpO2: 97% 100% 100% 100%  Weight:   73 kg   Height:        Wt Readings from Last 3  Encounters:  03/12/20 73 kg  04/08/15 81.7 kg  02/26/14 71.7 kg     Intake/Output Summary (Last 24 hours) at 03/12/2020 1639 Last data filed at 03/12/2020 1500 Gross per 24 hour  Intake 410.32 ml  Output --  Net 410.32 ml   Physical Exam  Gen:- Awake Alert,   HEENT:- [place].AT, No sclera icterus Neck-Supple Neck,No JVD,.  Lungs-  CTAB , fair symmetrical air movement CV- S1, S2 normal, regular  Abd-  +ve B.Sounds, Abd Soft, No tenderness,    Extremity/Skin:- No  edema, pedal pulses present  Psych-affect is appropriate, oriented x3 Neuro-significant left-sided weakness,  no tremors   Data Review:   Micro Results Recent Results (from the past 240 hour(s))  Respiratory Panel by RT PCR (Flu A&B, Covid) - Nasopharyngeal Swab     Status: None   Collection Time: 03/11/20 12:09 PM   Specimen: Nasopharyngeal Swab  Result Value Ref Range Status   SARS Coronavirus 2 by RT PCR NEGATIVE NEGATIVE Final    Comment: (NOTE) SARS-CoV-2 target nucleic acids are NOT DETECTED.  The SARS-CoV-2 RNA is generally detectable in upper respiratoy specimens during the acute phase of infection. The lowest concentration of SARS-CoV-2 viral copies this assay can detect is 131 copies/mL. A negative result does not preclude SARS-Cov-2 infection and should not be used as the sole basis for treatment or other patient management decisions. A negative result may occur with  improper specimen collection/handling, submission of specimen other than nasopharyngeal swab, presence of viral mutation(s) within the areas targeted by this assay, and inadequate number of viral copies (<131 copies/mL). A negative result must be combined with clinical observations, patient history, and epidemiological information. The expected result is Negative.  Fact Sheet for Patients:  03/13/20  Fact Sheet for Healthcare Providers:  https://www.moore.com/  This test is no t  yet approved or cleared by the https://www.young.biz/ FDA and  has been authorized for detection and/or diagnosis of SARS-CoV-2 by FDA under an Emergency Use Authorization (EUA). This EUA will remain  in effect (meaning this test can be used) for the duration of the COVID-19 declaration under Section 564(b)(1) of the Act, 21 U.S.C. section 360bbb-3(b)(1), unless the authorization is terminated or revoked sooner.     Influenza A by PCR NEGATIVE NEGATIVE Final   Influenza B by PCR NEGATIVE NEGATIVE Final    Comment: (NOTE) The Xpert Xpress SARS-CoV-2/FLU/RSV assay is intended as an aid in  the diagnosis of influenza from Nasopharyngeal swab specimens and  should not be used as a sole basis for treatment. Nasal washings and  aspirates are unacceptable for Xpert Xpress SARS-CoV-2/FLU/RSV  testing.  Fact Sheet for Patients: [place]  Fact Sheet for Healthcare Providers: https://www.young.biz/  This test is not yet approved or cleared by the [place] FDA and  has been authorized for detection and/or diagnosis of SARS-CoV-2 by  FDA under an Emergency Use Authorization (EUA). This EUA will remain  in effect (meaning this test can be used) for the duration of the  Covid-19 declaration under Section 564(b)(1) of the Act, 21  U.S.C. section 360bbb-3(b)(1), unless the authorization is  terminated or revoked. Performed at [facility], [address], [place], [place] [address]     Radiology Reports EEG  Result Date: 03/11/2020 Charlsie Quest, MD     03/11/2020  3:41 PM Patient Name: ESMA KILTS MRN: [id number] Epilepsy Attending: Charlsie Quest Referring Physician/Provider: Dr Brooke Dare Date: 03/11/2020 Duration: 25.59 mins Patient history: 54 year old female presented with acute onset left-sided weakness.  EEG to evaluate for seizures. Level of alertness: Awake, asleep AEDs during EEG study: None Technical aspects: This EEG study was  done with scalp electrodes positioned according to the 10-20 International system of electrode placement. Electrical activity was acquired at a sampling rate of 500Hz  and reviewed with a high frequency filter of 70Hz  and a low frequency filter of 1Hz . EEG data were recorded continuously and digitally stored. Description: The posterior dominant rhythm consists of 8-9 Hz activity of moderate voltage (25-35 uV) seen predominantly in posterior head regions, symmetric and reactive to eye opening and eye closing. Sleep was characterized by vertex waves, sleep spindles (12 to 14 Hz), maximal frontocentral region.    Physiologic photic driving was not seen during photic stimulation.  Hyperventilation was not performed.   IMPRESSION: This study is within normal limits. No seizures or epileptiform discharges were seen throughout the recording.   DG Wrist 2 Views Left  Result Date: 03/11/2020 CLINICAL DATA:  54 year old female with fall and trauma to the left wrist. EXAM: LEFT WRIST - 2 VIEW COMPARISON:  Left hand radiograph dated 03/11/2020. FINDINGS: There is no acute fracture or dislocation. The bones are osteopenic. The soft tissues are unremarkable. IMPRESSION: Negative. Electronically Signed   By: 03/13/2020 M.D.   On: 03/11/2020 15:50   CT CERVICAL SPINE WO CONTRAST  Result Date: 03/11/2020 CLINICAL DATA:  Elgie Collard from bed.  Head injury. EXAM: CT CERVICAL SPINE WITHOUT CONTRAST TECHNIQUE: Multidetector CT imaging of the cervical spine was performed without intravenous contrast. Multiplanar CT image reconstructions were also generated. COMPARISON:  None. FINDINGS: Alignment: Normal alignment. Straightening of the cervical lordosis. Skull base and vertebrae: Negative for fracture or focal bone lesion. Soft tissues and spinal canal: Negative for mass or adenopathy. Disc levels: Multilevel disc degeneration and spurring. There is spinal and foraminal stenosis bilaterally at C4-5, C5-6. Upper  chest: Lung apices clear bilaterally. Other: None IMPRESSION: Negative for cervical spine fracture Cervical spondylosis C4-5 and C5-6. Electronically Signed   By: 03/13/2020 M.D.   On: 03/11/2020 08:26   MR BRAIN WO CONTRAST  Result Date: 03/11/2020 CLINICAL DATA:  Seizure, abnormal neuro exam. Additional history provided: Altered mental status, fall out of bed with left-sided weakness, slurred speech, left-sided facial droop. EXAM: MRI HEAD WITHOUT CONTRAST TECHNIQUE: Multiplanar, multiecho pulse sequences of the brain and surrounding structures were obtained without intravenous contrast. COMPARISON:  Noncontrast head CT performed earlier the same day 03/11/2020. FINDINGS: Brain: The examination is intermittently motion degraded. Most notably, there is moderate motion degradation of the axial T2/FLAIR sequence and moderate/severe motion degradation of the coronal T2/FLAIR and coronal T2 weighted  sequences oriented perpendicular to the long axis of the hippocampi. Redemonstrated age advanced cerebral and cerebellar atrophy. A few small scattered foci of T2/FLAIR hyperintensity within the cerebral white matter are nonspecific, but most commonly second to chronic small vessel ischemia. Within the limitations of motion degradation, no definite hippocampal size or signal discrepancy. There is no acute infarct. No evidence of intracranial mass. No chronic intracranial blood products. No extra-axial fluid collection. No midline shift. Vascular: Expected proximal arterial flow voids. Skull and upper cervical spine: No focal marrow lesion Sinuses/Orbits: Visualized orbits show no acute finding. Minimal ethmoid sinus mucosal thickening. IMPRESSION: Motion degraded examination, as described and limiting evaluation. No evidence of acute intracranial abnormality, including acute infarction. Mild multifocal T2 hyperintense signal changes within the cerebral white matter are nonspecific, but most commonly related to  chronic small vessel ischemia. Age-advanced cerebellar and cerebral atrophy. No specific seizure focus is identified. Mild ethmoid sinus mucosal thickening. Electronically Signed   By: Jackey Loge DO   On: 03/11/2020 11:13   DG Chest Port 1 View  Result Date: 03/11/2020 CLINICAL DATA:  Fall EXAM: PORTABLE CHEST 1 VIEW COMPARISON:  May 28, 2013. FINDINGS: Cardiomediastinal silhouette is somewhat accentuated by portable AP technique and low lung volumes. No consolidation. No visible pleural effusions or pneumothorax. No acute osseous abnormality. IMPRESSION: No acute cardiopulmonary disease. Electronically Signed   By: Feliberto Harts MD   On: 03/11/2020 09:34   DG Shoulder Left Portable  Result Date: 03/11/2020 CLINICAL DATA:  Left shoulder pain after fall EXAM: LEFT SHOULDER COMPARISON:  None. FINDINGS: There is no evidence of fracture or dislocation. Minimal arthropathy of the left AC joint. Glenohumeral joint appears within normal limits on the provided projections. Soft tissues are unremarkable. IMPRESSION: No acute fracture or dislocation, left shoulder. Electronically Signed   By: Duanne Guess D.O.   On: 03/11/2020 09:35   DG Hand Complete Left  Result Date: 03/11/2020 CLINICAL DATA:  Left hand pain after fall EXAM: LEFT HAND - COMPLETE 3+ VIEW COMPARISON:  04/26/2012 FINDINGS: There is no evidence of acute fracture or dislocation. Chronic healed fracture of the left small finger proximal phalanx. Mild arthropathy most pronounced at the first MCP joint and ring finger DIP joint. Soft tissues are unremarkable. IMPRESSION: No acute osseous abnormality, left hand. Electronically Signed   By: Duanne Guess D.O.   On: 03/11/2020 09:38   CT HEAD CODE STROKE WO CONTRAST  Result Date: 03/11/2020 CLINICAL DATA:  Code stroke.  Fall last night.  Slurred speech EXAM: CT HEAD WITHOUT CONTRAST TECHNIQUE: Contiguous axial images were obtained from the base of the skull through the vertex  without intravenous contrast. COMPARISON:  03/02/2009 FINDINGS: Brain: No evidence of acute infarction, hemorrhage, hydrocephalus, extra-axial collection or mass lesion/mass effect. Brain atrophy, especially of the cerebellum. There is history of alcohol dependency. Vascular: Atheromatous calcification Skull: Normal. Negative for fracture or focal lesion. Sinuses/Orbits: Negative Other: These results were called by telephone at the time of interpretation on 03/11/2020 at 8:16 am to provider [facility] ZAMMIT , who verbally acknowledged these results. ASPECTS [facility] Stroke Program Early CT Score) - Ganglionic level infarction (caudate, lentiform nuclei, internal capsule, insula, M1-M3 cortex): 7 - Supraganglionic infarction (M4-M6 cortex): 3 Total score (0-10 with 10 being normal): 10 IMPRESSION: 1. No acute finding 2. Age advanced atrophy, especially of the cerebellum. Electronically Signed   By: Marnee Spring M.D.   On: 03/11/2020 08:17     CBC Recent Labs  Lab 03/11/20 0825 03/11/20 1610  WBC 10.0  --   HGB 10.2* 10.9*  HCT 31.1* 32.0*  PLT 182  --   MCV 104.0*  --   MCH 34.1*  --   MCHC 32.8  --   RDW 14.6  --   LYMPHSABS 3.8  --   MONOABS 0.9  --   EOSABS 0.0  --   BASOSABS 0.1  --     Chemistries  Recent Labs  Lab 03/11/20 0825 03/11/20 0842  NA 143 147*  K 3.6 4.5  CL 111 111  CO2 24  --   GLUCOSE 114* 112*  BUN 6 4*  CREATININE 0.57 1.00  CALCIUM 8.5*  --   AST 61*  --   ALT 20  --   ALKPHOS 71  --   BILITOT 2.5*  --    ------------------------------------------------------------------------------------------------------------------ No results for input(s): CHOL, HDL, LDLCALC, TRIG, CHOLHDL, LDLDIRECT in the last 72 hours.  No results found for: HGBA1C ------------------------------------------------------------------------------------------------------------------ No results for input(s): TSH, T4TOTAL, T3FREE, THYROIDAB in the last 72 hours.  Invalid input(s):  FREET3 ------------------------------------------------------------------------------------------------------------------ Recent Labs    03/11/20 0825  VITAMINB12 713    Coagulation profile Recent Labs  Lab 03/11/20 0825  INR 1.7*    No results for input(s): DDIMER in the last 72 hours.  Cardiac Enzymes No results for input(s): CKMB, TROPONINI, MYOGLOBIN in the last 168 hours.  Invalid input(s): CK ------------------------------------------------------------------------------------------------------------------ No results found for: BNP   Shon Hale M.D on 03/12/2020 at 4:39 PM  Go to [email or web address] - for contact info  [facility] - Office  [phone]

## 2020-03-13 ENCOUNTER — Telehealth: Payer: Self-pay | Admitting: Family Medicine

## 2020-03-13 DIAGNOSIS — G8384 Todd's paralysis (postepileptic): Secondary | ICD-10-CM | POA: Diagnosis present

## 2020-03-13 LAB — HIV ANTIBODY (ROUTINE TESTING W REFLEX): HIV Screen 4th Generation wRfx: NONREACTIVE

## 2020-03-13 MED ORDER — AMLODIPINE BESYLATE 5 MG PO TABS
5.0000 mg | ORAL_TABLET | Freq: Every day | ORAL | 3 refills | Status: DC
Start: 2020-03-14 — End: 2021-06-26

## 2020-03-13 MED ORDER — ACETAMINOPHEN 325 MG PO TABS: 650.0000 mg | ORAL_TABLET | ORAL | 2 refills | Status: DC | PRN

## 2020-03-13 MED ORDER — MULTI-VITAMIN/MINERALS PO TABS: 1.0000 | ORAL_TABLET | Freq: Every day | ORAL | 2 refills | Status: AC

## 2020-03-13 MED ORDER — PHENYTOIN SODIUM EXTENDED 300 MG PO CAPS
300.0000 mg | ORAL_CAPSULE | Freq: Every day | ORAL | 11 refills | Status: DC
Start: 2020-03-13 — End: 2021-06-26

## 2020-03-13 MED ORDER — ATORVASTATIN CALCIUM 20 MG PO TABS
20.0000 mg | ORAL_TABLET | Freq: Every day | ORAL | 11 refills | Status: DC
Start: 2020-03-13 — End: 2021-06-26

## 2020-03-13 MED ORDER — LISINOPRIL 20 MG PO TABS: 20.0000 mg | ORAL_TABLET | Freq: Every day | ORAL | 11 refills | Status: DC

## 2020-03-13 MED ORDER — ADULT MULTIVITAMIN W/MINERALS CH
1.0000 | ORAL_TABLET | Freq: Every day | ORAL | 3 refills | Status: DC
Start: 2020-03-14 — End: 2023-09-01

## 2020-03-13 MED ORDER — MAGNESIUM SULFATE 4 GM/100ML IV SOLN
4.0000 g | Freq: Once | INTRAVENOUS | Status: AC
  Administered 2020-03-13: 4 g via INTRAVENOUS
  Filled 2020-03-13: qty 100

## 2020-03-13 NOTE — Plan of Care (Signed)
  Problem: Education: Goal: Expressions of having a comfortable level of knowledge regarding the disease process will increase Outcome: Progressing   Problem: Health Behavior/Discharge Planning: Goal: Compliance with prescribed medication regimen will improve Outcome: Progressing   Problem: Safety: Goal: Verbalization of understanding the information provided will improve Outcome: Progressing   Problem: Education: Goal: Knowledge of General Education information will improve Description: Including pain rating scale, medication(s)/side effects and non-pharmacologic comfort measures Outcome: Progressing   Problem: Health Behavior/Discharge Planning: Goal: Ability to manage health-related needs will improve Outcome: Progressing   Problem: Clinical Measurements: Goal: Ability to maintain clinical measurements within normal limits will improve Outcome: Progressing Goal: Will remain free from infection Outcome: Progressing Goal: Diagnostic test results will improve Outcome: Progressing Goal: Respiratory complications will improve Outcome: Progressing Goal: Cardiovascular complication will be avoided Outcome: Progressing   Problem: Activity: Goal: Risk for activity intolerance will decrease Outcome: Progressing   Problem: Nutrition: Goal: Adequate nutrition will be maintained Outcome: Progressing   Problem: Coping: Goal: Level of anxiety will decrease Outcome: Progressing   Problem: Elimination: Goal: Will not experience complications related to bowel motility Outcome: Progressing Goal: Will not experience complications related to urinary retention Outcome: Progressing   Problem: Pain Managment: Goal: General experience of comfort will improve Outcome: Progressing   Problem: Safety: Goal: Ability to remain free from injury will improve Outcome: Progressing   Problem: Skin Integrity: Goal: Risk for impaired skin integrity will decrease Outcome: Progressing

## 2020-03-13 NOTE — TOC Transition Note (Signed)
Transition of Care [facility]) - CM/SW Discharge Note   Patient Details  Name: Cynthia Mclean MRN: [id number] Date of Birth: [birth year]  Transition of Care [facility]) CM/SW Contact:  Karn Cassis, LCSW Phone Number: 03/13/2020, 2:06 PM   Clinical Narrative:  Pt d/c today. LCSW discussed CIR and SNF with pt as recommended by PT/OT. Pt states she is not interested in either and wants to return home. She states her daughter and boyfriend are there to help. Pt agreeable to home health services. Referred to [facility] for charity home health. Bonita Quin with [facility] accepts referral for Pt, OT, RN, and SW services. Pt requests DME (rolling walker, shower stool, wheelchair, and BSC). Referred to [facility] for charity DME. [facility] will deliver most equipment to room and drop ship wheelchair. No other needs reported.      Final next level of care: Home w Home Health Services Barriers to Discharge: Barriers Resolved   Patient Goals and CMS Choice Patient states their goals for this hospitalization and ongoing recovery are:: Return home   Choice offered to / list presented to : NA  Discharge Placement                  Name of family member notified: pt only Patient and family notified of of transfer: 03/13/20  Discharge Plan and Services In-house Referral: Clinical Social Work Discharge Planning Services: NA            DME Arranged: Dan Humphreys rolling, Wheelchair manual, Bedside commode, Shower stool DME Agency: [facility] Date DME Agency Contacted: 03/13/20 Time DME Agency Contacted: [phone] Representative spoke with at DME Agency: [facility] HH Arranged: RN, PT, Social Work, OT HH Agency: [facility] Date HH Agency Contacted: 03/13/20 Time HH Agency Contacted: [phone] Representative spoke with at [facility] Agency: Bonita Quin  Social Determinants of Health (SDOH) Interventions     Readmission Risk Interventions No flowsheet data found.

## 2020-03-13 NOTE — Progress Notes (Signed)
Occupational Therapy Treatment Patient Details Name: Cynthia Mclean MRN: [id number] DOB: 06-Mar-1966 Today's Date: 03/13/2020    History of present illness 54 year old woman PMH seizure disorder, alcohol dependency presenting to the emergency department after waking up this morning with severe left arm pain, weakness and left leg weakness.  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   OT comments  Pt agreeable to OT evaluation this am. Demonstrating improvement in pain and active movement of LUE. Pt demonstrates improved LUE strength in shoulder and elbow, continues to have poor active elbow extension and wrist/hand mobility. Pt performing morning ADLs while seated in chair, using RUE primarily with mild ataxic movements when attempting to aim items towards face/mouth. Attempted to incorporate LUE into holding or opening items however was unsuccessful and required OT assist for set-up. Recommend CIR on discharge to improve pt safety and independence in ADLs and mobility tasks. Pt continues to be a high fall risk and is not safe to discharge home at this time.    Follow Up Recommendations  CIR    Equipment Recommendations  None recommended by OT    Recommendations for Other Services Rehab consult    Precautions / Restrictions Precautions Precautions: Fall       Mobility Bed Mobility Overal bed mobility: Needs Assistance Bed Mobility: Supine to Sit     Supine to sit: Min assist     General bed mobility comments: cuing for hand placement and use of bed rail, assist for sitting up fully once up on elbow  Transfers Overall transfer level: Needs assistance Equipment used: Rolling walker (2 wheeled) Transfers: Sit to/from [employer] Sit to Stand: Mod assist Stand pivot transfers: Mod assist                ADL either performed  or assessed with clinical judgement   ADL Overall ADL's : Needs assistance/impaired     Grooming: Set up;Sitting Grooming Details (indicate cue type and reason): Pt unable to open toothbrush, toothpaste requiring OT to open and set-up items. Pt using right hand to brush teeth, noting compensatory head turning versus active brushing with RUE. Pt washing face and hands, when washing face pt with poor coordination when reaching for eyes, multiple attempts before successful.  Upper Body Bathing: Moderate assistance;Sitting   Lower Body Bathing: Moderate assistance;Maximal assistance;Sitting/lateral leans   Upper Body Dressing : Moderate assistance;Sitting   Lower Body Dressing: Maximal assistance;Sitting/lateral leans;Sit to/from stand   Toilet Transfer: Moderate assistance;Stand-pivot;BSC;RW Toilet Transfer Details (indicate cue type and reason): simulated with bed to chair transfer, cuing for hand placement. Noted to drag/slide left foot versus stepping Toileting- Clothing Manipulation and Hygiene: Maximal assistance;Sit to/from stand         General ADL Comments: Pt requiring increased assistance for ADLs due to left side pain and weakness limiting mobility                Cognition Arousal/Alertness: Awake/alert Behavior During Therapy: WFL for tasks assessed/performed Overall Cognitive Status: Within Functional Limits for tasks assessed                                                     Pertinent Vitals/ Pain       Pain Assessment: 0-10  Pain Score: 3  Pain Location: left wrist Pain Descriptors / Indicators: Grimacing;Sore Pain Intervention(s): Limited activity within patient's tolerance;Monitored during session;Repositioned     Prior Functioning/Environment              Frequency  Min 2X/week        Progress Toward Goals  OT Goals(current goals can now be found in the care plan section)  Progress towards OT goals: Progressing toward  goals  Acute Rehab OT Goals Patient Stated Goal: To go home OT Goal Formulation: With patient Time For Goal Achievement: 03/26/20 Potential to Achieve Goals: Good ADL Goals Pt Will Perform Grooming: with supervision;standing Pt Will Perform Upper Body Dressing: with supervision;sitting Pt Will Transfer to Toilet: with min guard assist;ambulating;regular height toilet Pt Will Perform Toileting - Clothing Manipulation and hygiene: with supervision;sitting/lateral leans;sit to/from stand Pt/caregiver will Perform Home Exercise Program: Increased strength;Left upper extremity;Independently;With written HEP provided  Plan Discharge plan needs to be updated          End of Session Equipment Utilized During Treatment: Gait belt;Rolling walker  OT Visit Diagnosis: Muscle weakness (generalized) (M62.81);Repeated falls (R29.6);Pain Pain - Right/Left: Left Pain - part of body: Shoulder;Arm;Hand   Activity Tolerance Patient tolerated treatment well   Patient Left in chair;with call bell/phone within reach;with chair alarm set   Nurse Communication Other (comment);Mobility status (purewick removal and placement of BSC at chair)        Time: 0454-0981 OT Time Calculation (min): 31 min  Charges: OT General Charges $OT Visit: 1 Visit OT Treatments $Therapeutic Activity: 23-37 mins    Ezra Sites, OTR/L  [phone] 03/13/2020, 8:14 AM

## 2020-03-13 NOTE — Telephone Encounter (Signed)
This patient [place]. She was recently hospitalized so she does need time out I have not dseen her in the office for over 2 years.  Please schedule in office appt with available provider next week, thanks I am returning FMLA form to front desk

## 2020-03-13 NOTE — Progress Notes (Signed)
Physical Therapy Treatment Patient Details Name: Cynthia Mclean MRN: [id number] DOB: 10-24-1965 Today's Date: 03/13/2020    History of Present Illness 54 year old woman PMH seizure disorder, alcohol dependency presenting to the emergency department after waking up this morning with severe left arm pain, weakness and left leg weakness.  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    PT Comments    Patient presents seated in chair (assisted by OT staff) and agreeable for therapy.  Patient demonstrates fair/good return for completing BLE ROM/strengthening exercises, unable to complete full ROM LLE due to weakness, very unsteady on feet with near loss of balance during chair/bed/BSC transfers with Mod hand held assist, able to take a few more steps using RW, but limited due to difficulty holding onto walker secondary to poor left hand grip strength and poor coordination of LLE.  Patient tolerated staying up in chair after therapy with her sister present in room.  Patient will benefit from continued physical therapy in hospital and recommended venue below to increase strength, balance, endurance for safe ADLs and gait.     Follow Up Recommendations  CIR     Equipment Recommendations  Rolling walker with 5" wheels;3in1 (PT)    Recommendations for Other Services       Precautions / Restrictions Precautions Precautions: Fall Restrictions Weight Bearing Restrictions: No    Mobility  Bed Mobility Overal bed mobility: Needs Assistance Bed Mobility: Supine to Sit     Supine to sit: Min assist     General bed mobility comments: Patient presents up in chair (assisted by OT)  Transfers Overall transfer level: Needs assistance Equipment used: Rolling walker (2 wheeled) Transfers: Sit to/from [employer] Sit to Stand: Mod assist Stand pivot  transfers: Mod assist       General transfer comment: very unsteady on feet with frequent buckling of LLE  Ambulation/Gait Ambulation/Gait assistance: Mod assist Gait Distance (Feet): 8 Feet Assistive device: Rolling walker (2 wheeled) Gait Pattern/deviations: Decreased step length - right;Decreased step length - left;Decreased stance time - left;Decreased stride length;Antalgic Gait velocity: slow   General Gait Details: slow labored cadence with frequent buckling of LLE due to weakness, occasional dragging of left foot and limited due to fatigue   Stairs             Wheelchair Mobility    Modified Rankin (Stroke Patients Only)       Balance Overall balance assessment: Needs assistance Sitting-balance support: Feet unsupported;No upper extremity supported Sitting balance-Leahy Scale: Fair Sitting balance - Comments: fair/good seated at edge of chair   Standing balance support: During functional activity;No upper extremity supported Standing balance-Leahy Scale: Poor Standing balance comment: fair/poor using RW                            Cognition Arousal/Alertness: Awake/alert Behavior During Therapy: WFL for tasks assessed/performed Overall Cognitive Status: Within Functional Limits for tasks assessed                                        Exercises General Exercises - Lower Extremity Long Arc Quad: Seated;AROM;Strengthening;Both;15 reps Hip Flexion/Marching: Seated;AROM;Strengthening;Both;15 reps Toe Raises: Seated;AROM;Strengthening;Both;15 reps Heel Raises: Seated;AROM;Strengthening;Both;15 reps    General Comments        Pertinent  Vitals/Pain Pain Assessment: Faces Pain Score: 3  Faces Pain Scale: Hurts a little bit Pain Location: left wrist Pain Descriptors / Indicators: Sore Pain Intervention(s): Limited activity within patient's tolerance;Monitored during session;Repositioned    Home Living                       Prior Function            PT Goals (current goals can now be found in the care plan section) Acute Rehab PT Goals Patient Stated Goal: To go home PT Goal Formulation: With patient Time For Goal Achievement: 04/02/20 Potential to Achieve Goals: Good Progress towards PT goals: Progressing toward goals    Frequency    Min 3X/week      PT Plan Current plan remains appropriate    Co-evaluation              AM-PAC PT "6 Clicks" Mobility   Outcome Measure  Help needed turning from your back to your side while in a flat bed without using bedrails?: A Lot Help needed moving from lying on your back to sitting on the side of a flat bed without using bedrails?: A Lot Help needed moving to and from a bed to a chair (including a wheelchair)?: A Lot Help needed standing up from a chair using your arms (e.g., wheelchair or bedside chair)?: A Lot Help needed to walk in hospital room?: A Lot Help needed climbing 3-5 steps with a railing? : Total 6 Click Score: 11    End of Session   Activity Tolerance: Patient tolerated treatment well;Patient limited by fatigue Patient left: in chair;with call bell/phone within reach;with family/visitor present Nurse Communication: Mobility status PT Visit Diagnosis: Unsteadiness on feet (R26.81);Other abnormalities of gait and mobility (R26.89);Muscle weakness (generalized) (M62.81)     Time: 0932-6712 PT Time Calculation (min) (ACUTE ONLY): 34 min  Charges:  $Therapeutic Exercise: 8-22 mins $Therapeutic Activity: 8-22 mins                     11:42 AM, 03/13/20 Ocie Bob, MPT Physical Therapist with [facility] [phone] office [phone] mobile phone

## 2020-03-13 NOTE — Discharge Instructions (Signed)
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

## 2020-03-13 NOTE — Telephone Encounter (Signed)
FMLA forms received  Copied Noted Sleeved  

## 2020-03-13 NOTE — Discharge Summary (Signed)
Cynthia Mclean, is a 54 y.o. female  DOB [birth year]  MRN [id number].  Admission date:  03/11/2020  Admitting Physician  Revia Nghiem Mariea Clonts, MD  Discharge Date:  03/13/2020   Primary MD  Cynthia Perches, MD  Recommendations for primary care physician for things to follow:   1) --Todd's paralysis with significant left-sided weakness -Needs physical therapy, OT rehab at home also needs RN and SW for medication compliance and cardiopulmonary assessment  2) please take your medications as prescribed to avoid further complications  3) follow-up primary care physician in 1 to 2 weeks for recheck and reevaluation  Admission Diagnosis  Seizures (HCC) [R56.9] Wrist pain [M25.539] Weakness [R53.1] Fall [W19.XXXA] Acute metabolic encephalopathy [G93.41]   Discharge Diagnosis  Seizures (HCC) [R56.9] Wrist pain [M25.539] Weakness [R53.1] Fall [W19.XXXA] Acute metabolic encephalopathy [G93.41]   Principal Problem:   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 Alcohol dependency (HCC)   . Hypertension   . IDA (iron deficiency anemia)    required blood tranfusion  . Nicotine addiction   . Seizure disorder Cynthia Mclean)     Past Surgical History:  Procedure Laterality Date  . btl    . left knee surgery, fracture       HPI  from the history and physical done on the day of admission:    Chief Complaint: left arm pain, weakness  HPI:  54 year old woman PMH seizure disorder, alcohol dependency presenting  to the emergency department after waking up this morning with severe left arm pain, weakness and left leg weakness.  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  Patient does not remember what happened.  She lives at home with her boyfriend.  Her grandchild was over.  She reports having had a couple drinks of alcohol and went to bed around 930.  She woke up this morning on the floor in her bedroom.  She had severe left arm pain and left arm and left leg weakness.  No specific aggravating or alleviating factors noted.  No other associated symptoms reported.  Chart review:  Last seen by PCP 2016 at which time she was noncompliant with antiepileptic.  Last hospitalized 2004 when she presented with seizures, alcoholic pancreatitis, gastritis.  ED Course: Seen by teleneurology with imaging work-up recommended, ultimate recommendations for resuming antiepileptic, empiric thiamine and monitoring.    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 -Left-sided weakness improving  -History of noncompliance with Dilantin -Pharmacy consult for Dilantin dosing appreciated -Last seen by PCP 2016 at which time she was noncompliant with antiepileptic. Last hospitalized 2004 when she presented with seizures, alcoholic pancreatitis, gastritis. -PT OT evaluated-  Recommended SNF Vs CIR--patient declines above patient wants to go home with home health -I called and spoke with patient's daughter by phone also spoke with patient's sister face-to-face at bedside expressed concerns about high risk  for falling given left-sided weakness -Patient's daughter and sister states that they will provide 24/7 care and supervision for patient to reduce the chances of falling at home  2) acute alcohol intoxication and fall and seizures--- patient is not interested in alcohol rehab -Blood alcohol level was 340 on admission -CK is not elevated -Treated with lorazepam per CIWA protocol, -Continue multivitamin, folic acid and thiamine -Abstinence from alcohol advised, patient sister and daughter will also try to encourage patient to be abstinence from alcohol  3) history of seizure disorder--- notoriously noncompliant please see #1 above -Compliance with Dilantin advised, follow-up with PCP and neurologist advised  4)HTN--discharge on lisinopril and amlodipine 5 mg daily, follow-up with PCP for BP recheck  5) falls generalized weakness and left-sided weakness--- PT/ OT eval appreciated recommends SNF rehab Vs CIR, -Please see discussion above in #1 regarding concerns about high fall risk at home  Disposition--- discharge home with home health services  Disposition: The patient is from: Home   Code Status :  Full code  Family Communication:    (patient is alert, awake and coherent)  -I called and spoke with patient's daughter by phone also spoke with patient's sister face-to-face at bedside expressed concerns about high risk for falling given left-sided weakness -Patient's daughter and sister states that they will provide 24/7 care and supervision for patient to reduce the chances of falling at home  Consults  : neuro  Discharge Condition: Stable however left-sided weakness puts patient at high risk for falling  Follow UP   Follow-up Information    Cynthia Mclean, Roy, MD. Schedule an appointment as soon as possible for a visit in 1 week(s).   Specialty: Internal Medicine Contact information: [address] [place] [address] [phone]        Cynthia PerchesSimpson, Margaret  E, MD. Schedule an appointment as soon as possible for a visit in 1 week(s).   Specialty: Family Medicine Contact information: [address] [place] [address] [phone]                Diet and Activity recommendation:  As advised  Discharge Instructions    Discharge Instructions    Call MD for:  difficulty breathing, headache or visual disturbances   Complete by: As directed    Call MD for:  persistant dizziness or light-headedness   Complete by: As directed    Call MD for:  persistant nausea and vomiting   Complete by: As directed    Call MD for:  severe uncontrolled pain   Complete by: As directed    Call MD for:  temperature >100.4   Complete by: As directed    Diet - low sodium heart healthy   Complete by: As directed    Discharge instructions   Complete by: As directed    1) -Todd's paralysis with significant left-sided weakness--- you have refused inpatient rehab -Needs physical therapy rehab at home also needs RN for medication compliance and  cardiopulmonary assessment  2) please take your medications as prescribed to avoid further complications  3) follow-up primary care physician in 1 to 2 weeks for recheck and reevaluation   Increase activity slowly   Complete by: As directed         Discharge Medications     Allergies as of 03/13/2020   No Known Allergies     Medication List    STOP taking these medications   benazepril 10 MG tablet Commonly known as: LOTENSIN     TAKE these medications   acetaminophen 325 MG tablet Commonly known as: TYLENOL Take 2 tablets (650 mg total) by mouth every 4 (four) hours as needed for mild pain (or temp >/= 99.5).   amLODipine 5 MG tablet Commonly known as: NORVASC Take 1 tablet (5 mg total) by mouth daily. Start taking on: March 14, 2020   atorvastatin 20 MG tablet Commonly known as: LIPITOR Take 1 tablet (20 mg total) by mouth daily.   ergocalciferol 1.25 MG (50000 UT)  capsule Commonly known as: VITAMIN D2 Take 1 capsule (50,000 Units total) by mouth once a week. One capsule once weekly What changed: additional instructions   lisinopril 20 MG tablet Commonly known as: ZESTRIL Take 1 tablet (20 mg total) by mouth daily.   multivitamin with minerals tablet Take 1 tablet by mouth daily.   multivitamin with minerals Tabs tablet Take 1 tablet by mouth daily. Start taking on: March 14, 2020   phenytoin 300 MG ER capsule Commonly known as: DILANTIN Take 1 capsule (300 mg total) by mouth daily. What changed: Another medication with the same name was removed. Continue taking this medication, and follow the directions you see here.            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  A cane or walker will not resolve issue with performing activities of daily living. A wheelchair will allow patient to safely perform daily activities. Patient can safely propel the wheelchair in the home or has a caregiver who can provide assistance. Length of need Lifetime. Accessories: elevating leg rests (ELRs), wheel locks, extensions and anti-tippers.   03/13/20 1149   03/13/20 1144  For home use only DME Bedside commode  Once       Question:  Patient needs a bedside commode to treat with the following condition  Answer:  Todd's paralysis (postepileptic) (HCC)   03/13/20 1149   03/13/20 1144  For home use only DME Shower stool  Once        03/13/20 1149   03/13/20 1143  For home use only DME Walker rolling  Once       Question Answer Comment  Walker: With 5 Inch Wheels    Patient needs a walker to treat with the following condition Todd's paralysis (postepileptic) (HCC)      03/13/20 1149          Major procedures and Radiology Reports - PLEASE review detailed and final reports for all details, in brief -  EEG  Result Date: 03/11/2020 Charlsie Quest, MD  03/11/2020  3:41 PM Patient Name: SLOKA VOLANTE MRN: [id number] Epilepsy Attending: Charlsie Quest Referring Physician/Provider: Dr Brooke Dare Date: 03/11/2020 Duration: 25.59 mins Patient history: 54 year old female presented with acute onset left-sided weakness.  EEG to evaluate for seizures. Level of alertness: Awake, asleep AEDs during EEG study: None Technical aspects: This EEG study was done with scalp electrodes positioned according to the 10-20 International system of electrode placement. Electrical activity was acquired at a sampling rate of 500Hz  and reviewed with a high frequency filter of 70Hz  and a low frequency filter of 1Hz . EEG data were recorded continuously and digitally stored. Description: The posterior dominant rhythm consists of 8-9 Hz activity of moderate voltage (25-35 uV) seen predominantly in posterior head regions, symmetric and reactive to eye opening and eye closing. Sleep was characterized by vertex waves, sleep spindles (12 to 14 Hz), maximal frontocentral region.    Physiologic photic driving was not seen during photic stimulation.  Hyperventilation was not performed.   IMPRESSION: This study is within normal limits. No seizures or epileptiform discharges were seen throughout the recording.   DG Wrist 2 Views Left  Result Date: 03/11/2020 CLINICAL DATA:  54 year old female with fall and trauma to the left wrist. EXAM: LEFT WRIST - 2 VIEW COMPARISON:  Left hand radiograph dated 03/11/2020. FINDINGS: There is no acute fracture or dislocation. The bones are osteopenic. The soft tissues are unremarkable. IMPRESSION: Negative. Electronically Signed   By: 03/13/2020 M.D.   On: 03/11/2020 15:50   CT CERVICAL SPINE WO CONTRAST  Result Date: 03/11/2020 CLINICAL DATA:  Elgie Collard from bed.  Head injury. EXAM: CT CERVICAL SPINE WITHOUT CONTRAST TECHNIQUE: Multidetector CT imaging of the cervical spine was performed without intravenous contrast. Multiplanar CT image reconstructions were also generated. COMPARISON:  None. FINDINGS: Alignment: Normal alignment. Straightening of the cervical lordosis. Skull base and vertebrae: Negative for fracture or focal bone lesion. Soft tissues and spinal canal: Negative for mass or adenopathy. Disc levels: Multilevel disc degeneration and spurring. There is spinal and foraminal stenosis bilaterally at C4-5, C5-6. Upper chest: Lung apices clear bilaterally. Other: None IMPRESSION: Negative for cervical spine fracture Cervical spondylosis C4-5 and C5-6. Electronically Signed   By: 03/13/2020 M.D.   On: 03/11/2020 08:26   MR BRAIN WO CONTRAST  Result Date: 03/11/2020 CLINICAL DATA:  Seizure, abnormal neuro exam. Additional history provided: Altered mental status, fall out of bed with left-sided weakness, slurred speech, left-sided facial droop. EXAM: MRI HEAD WITHOUT CONTRAST TECHNIQUE: Multiplanar, multiecho pulse sequences of the brain and surrounding structures were obtained without intravenous contrast. COMPARISON:  Noncontrast head CT performed earlier the same day 03/11/2020. FINDINGS: Brain: The examination is intermittently motion degraded. Most notably, there is moderate motion degradation of the axial T2/FLAIR sequence and moderate/severe motion degradation of the coronal T2/FLAIR and coronal T2 weighted sequences oriented perpendicular to the long axis of the hippocampi. Redemonstrated age advanced cerebral and cerebellar atrophy. A few small scattered foci of T2/FLAIR hyperintensity within the cerebral white matter are nonspecific, but most commonly second to chronic small vessel ischemia. Within the limitations of  motion degradation, no definite hippocampal size or signal discrepancy. There is no acute infarct. No evidence of intracranial mass. No chronic intracranial blood products. No extra-axial fluid collection. No midline shift. Vascular: Expected proximal arterial flow voids. Skull and upper cervical spine: No focal marrow lesion Sinuses/Orbits: Visualized orbits show no acute finding. Minimal ethmoid sinus mucosal thickening. IMPRESSION: Motion degraded examination, as described and  limiting evaluation. No evidence of acute intracranial abnormality, including acute infarction. Mild multifocal T2 hyperintense signal changes within the cerebral white matter are nonspecific, but most commonly related to chronic small vessel ischemia. Age-advanced cerebellar and cerebral atrophy. No specific seizure focus is identified. Mild ethmoid sinus mucosal thickening. Electronically Signed   By: Jackey Loge DO   On: 03/11/2020 11:13   DG Chest Port 1 View  Result Date: 03/11/2020 CLINICAL DATA:  Fall EXAM: PORTABLE CHEST 1 VIEW COMPARISON:  May 28, 2013. FINDINGS: Cardiomediastinal silhouette is somewhat accentuated by portable AP technique and low lung volumes. No consolidation. No visible pleural effusions or pneumothorax. No acute osseous abnormality. IMPRESSION: No acute cardiopulmonary disease. Electronically Signed   By: Feliberto Harts MD   On: 03/11/2020 09:34   DG Shoulder Left Portable  Result Date: 03/11/2020 CLINICAL DATA:  Left shoulder pain after fall EXAM: LEFT SHOULDER COMPARISON:  None. FINDINGS: There is no evidence of fracture or dislocation. Minimal arthropathy of the left AC joint. Glenohumeral joint appears within normal limits on the provided projections. Soft tissues are unremarkable. IMPRESSION: No acute fracture or dislocation, left shoulder. Electronically Signed   By: Duanne Guess D.O.   On: 03/11/2020 09:35   DG Hand Complete Left  Result Date: 03/11/2020 CLINICAL DATA:  Left  hand pain after fall EXAM: LEFT HAND - COMPLETE 3+ VIEW COMPARISON:  04/26/2012 FINDINGS: There is no evidence of acute fracture or dislocation. Chronic healed fracture of the left small finger proximal phalanx. Mild arthropathy most pronounced at the first MCP joint and ring finger DIP joint. Soft tissues are unremarkable. IMPRESSION: No acute osseous abnormality, left hand. Electronically Signed   By: Duanne Guess D.O.   On: 03/11/2020 09:38   CT HEAD CODE STROKE WO CONTRAST  Result Date: 03/11/2020 CLINICAL DATA:  Code stroke.  Fall last night.  Slurred speech EXAM: CT HEAD WITHOUT CONTRAST TECHNIQUE: Contiguous axial images were obtained from the base of the skull through the vertex without intravenous contrast. COMPARISON:  03/02/2009 FINDINGS: Brain: No evidence of acute infarction, hemorrhage, hydrocephalus, extra-axial collection or mass lesion/mass effect. Brain atrophy, especially of the cerebellum. There is history of alcohol dependency. Vascular: Atheromatous calcification Skull: Normal. Negative for fracture or focal lesion. Sinuses/Orbits: Negative Other: These results were called by telephone at the time of interpretation on 03/11/2020 at 8:16 am to provider [facility] , who verbally acknowledged these results. ASPECTS [facility] Stroke Program Early CT Score) - Ganglionic level infarction (caudate, lentiform nuclei, internal capsule, insula, M1-M3 cortex): 7 - Supraganglionic infarction (M4-M6 cortex): 3 Total score (0-10 with 10 being normal): 10 IMPRESSION: 1. No acute finding 2. Age advanced atrophy, especially of the cerebellum. Electronically Signed   By: Marnee Spring M.D.   On: 03/11/2020 08:17    Micro Results   Recent Results (from the past 240 hour(s))  Respiratory Panel by RT PCR (Flu A&B, Covid) - Nasopharyngeal Swab     Status: None   Collection Time: 03/11/20 12:09 PM   Specimen: Nasopharyngeal Swab  Result Value Ref Range Status   SARS Coronavirus 2 by RT PCR  NEGATIVE NEGATIVE Final    Comment: (NOTE) SARS-CoV-2 target nucleic acids are NOT DETECTED.  The SARS-CoV-2 RNA is generally detectable in upper respiratoy specimens during the acute phase of infection. The lowest concentration of SARS-CoV-2 viral copies this assay can detect is 131 copies/mL. A negative result does not preclude SARS-Cov-2 infection and should not be used as the sole basis for treatment or  other patient management decisions. A negative result may occur with  improper specimen collection/handling, submission of specimen other than nasopharyngeal swab, presence of viral mutation(s) within the areas targeted by this assay, and inadequate number of viral copies (<131 copies/mL). A negative result must be combined with clinical observations, patient history, and epidemiological information. The expected result is Negative.  Fact Sheet for Patients:  https://www.moore.com/  Fact Sheet for Healthcare Providers:  https://www.young.biz/  This test is no t yet approved or cleared by the [place] FDA and  has been authorized for detection and/or diagnosis of SARS-CoV-2 by FDA under an Emergency Use Authorization (EUA). This EUA will remain  in effect (meaning this test can be used) for the duration of the COVID-19 declaration under Section 564(b)(1) of the Act, 21 U.S.C. section 360bbb-3(b)(1), unless the authorization is terminated or revoked sooner.     Influenza A by PCR NEGATIVE NEGATIVE Final   Influenza B by PCR NEGATIVE NEGATIVE Final    Comment: (NOTE) The Xpert Xpress SARS-CoV-2/FLU/RSV assay is intended as an aid in  the diagnosis of influenza from Nasopharyngeal swab specimens and  should not be used as a sole basis for treatment. Nasal washings and  aspirates are unacceptable for Xpert Xpress SARS-CoV-2/FLU/RSV  testing.  Fact Sheet for Patients: https://www.moore.com/  Fact Sheet for  Healthcare Providers: https://www.young.biz/  This test is not yet approved or cleared by the [place] FDA and  has been authorized for detection and/or diagnosis of SARS-CoV-2 by  FDA under an Emergency Use Authorization (EUA). This EUA will remain  in effect (meaning this test can be used) for the duration of the  Covid-19 declaration under Section 564(b)(1) of the Act, 21  U.S.C. section 360bbb-3(b)(1), unless the authorization is  terminated or revoked. Performed at [facility], [address], [place], [place] [address]     Today   Subjective    Khadijatou Borak today has no new complaints, -Eager to go home, -Declines SNF also declines CIR rehab -I called and spoke with patient's daughter by phone also spoke with patient's sister face-to-face at bedside expressed concerns about high risk for falling given left-sided weakness -Patient's daughter and sister states that they will provide 24/7 care and supervision for patient to reduce the chances of falling at home          Patient has been seen and examined prior to discharge   Objective   Blood pressure (!) 149/75, pulse 73, temperature 98.1 F (36.7 C), temperature source Oral, resp. rate 16, height 5\' 6"  (1.676 m), weight 73 kg, SpO2 99 %.   Intake/Output Summary (Last 24 hours) at 03/13/2020 1316 Last data filed at 03/12/2020 1700 Gross per 24 hour  Intake 410.32 ml  Output --  Net 410.32 ml    Exam Gen:- Awake Alert, no acute distress  HEENT:- [place].AT, No sclera icterus Neck-Supple Neck,No JVD,.  Lungs-  CTAB , good air movement bilaterally  CV- S1, S2 normal, regular Abd-  +ve B.Sounds, Abd Soft, No tenderness,    Extremity/Skin:- No  edema,   good pulses Psych-affect is appropriate, oriented x3 Neuro-left-sided hemiparesis persist, only slightly improved- no tremors    Data Review   CBC w Diff:  Lab Results  Component Value Date   WBC 8.0 03/12/2020   HGB 10.3 (L) 03/12/2020   HCT  30.7 (L) 03/12/2020   PLT 172 03/12/2020   LYMPHOPCT 38 03/11/2020   MONOPCT 9 03/11/2020   EOSPCT 0 03/11/2020   BASOPCT 1  03/11/2020    CMP:  Lab Results  Component Value Date   NA 140 03/12/2020   K 3.6 03/12/2020   CL 107 03/12/2020   CO2 26 03/12/2020   BUN 7 03/12/2020   CREATININE 0.52 03/12/2020   CREATININE 0.62 04/11/2015   PROT 6.9 03/12/2020   ALBUMIN 2.0 (L) 03/12/2020   BILITOT 3.5 (H) 03/12/2020   ALKPHOS 56 03/12/2020   AST 56 (H) 03/12/2020   ALT 19 03/12/2020  .   Total Discharge time is about 33 minutes  Shon Hale M.D on 03/13/2020 at 1:16 PM  Go to [email or web address] -  for contact info  [facility] - Office  [phone]

## 2020-03-13 NOTE — Plan of Care (Signed)
  Problem: Education: Goal: Expressions of having a comfortable level of knowledge regarding the disease process will increase Outcome: Adequate for Discharge   Problem: Health Behavior/Discharge Planning: Goal: Compliance with prescribed medication regimen will improve Outcome: Adequate for Discharge   Problem: Safety: Goal: Verbalization of understanding the information provided will improve Outcome: Adequate for Discharge   Problem: Education: Goal: Knowledge of General Education information will improve Description: Including pain rating scale, medication(s)/side effects and non-pharmacologic comfort measures Outcome: Adequate for Discharge   Problem: Health Behavior/Discharge Planning: Goal: Ability to manage health-related needs will improve Outcome: Adequate for Discharge   Problem: Clinical Measurements: Goal: Ability to maintain clinical measurements within normal limits will improve Outcome: Adequate for Discharge Goal: Will remain free from infection Outcome: Adequate for Discharge Goal: Diagnostic test results will improve Outcome: Adequate for Discharge Goal: Respiratory complications will improve Outcome: Adequate for Discharge Goal: Cardiovascular complication will be avoided Outcome: Adequate for Discharge   Problem: Activity: Goal: Risk for activity intolerance will decrease Outcome: Adequate for Discharge   Problem: Nutrition: Goal: Adequate nutrition will be maintained Outcome: Adequate for Discharge   Problem: Coping: Goal: Level of anxiety will decrease Outcome: Adequate for Discharge   Problem: Elimination: Goal: Will not experience complications related to bowel motility Outcome: Adequate for Discharge Goal: Will not experience complications related to urinary retention Outcome: Adequate for Discharge   Problem: Pain Managment: Goal: General experience of comfort will improve Outcome: Adequate for Discharge   Problem: Safety: Goal:  Ability to remain free from injury will improve Outcome: Adequate for Discharge   Problem: Skin Integrity: Goal: Risk for impaired skin integrity will decrease Outcome: Adequate for Discharge

## 2020-03-13 NOTE — Progress Notes (Signed)
Inpatient Rehabilitation-Admissions Coordinator   Per chart review, the patient is not interested in CIR at this time and prefers home. Confirmed with [facility] team member [place]. Per pt preference, AC will sign off.   Cheri Rous, OTR/L  Rehab Admissions Coordinator  [phone] 03/13/2020 2:53 PM

## 2020-03-15 LAB — VITAMIN B1: Vitamin B1 (Thiamine): 85.9 nmol/L (ref 66.5–200.0)

## 2020-03-31 ENCOUNTER — Encounter: Payer: Self-pay | Admitting: Internal Medicine

## 2020-03-31 ENCOUNTER — Other Ambulatory Visit: Payer: Self-pay

## 2020-03-31 ENCOUNTER — Telehealth: Payer: Self-pay | Admitting: *Deleted

## 2020-03-31 ENCOUNTER — Ambulatory Visit (INDEPENDENT_AMBULATORY_CARE_PROVIDER_SITE_OTHER): Payer: Self-pay | Admitting: Internal Medicine

## 2020-03-31 VITALS — BP 122/68 | HR 85 | Temp 98.2°F | Resp 16

## 2020-03-31 DIAGNOSIS — R569 Unspecified convulsions: Secondary | ICD-10-CM

## 2020-03-31 DIAGNOSIS — I10 Essential (primary) hypertension: Secondary | ICD-10-CM

## 2020-03-31 DIAGNOSIS — G8384 Todd's paralysis (postepileptic): Secondary | ICD-10-CM

## 2020-03-31 DIAGNOSIS — Z1211 Encounter for screening for malignant neoplasm of colon: Secondary | ICD-10-CM

## 2020-03-31 DIAGNOSIS — Z7689 Persons encountering health services in other specified circumstances: Secondary | ICD-10-CM

## 2020-03-31 DIAGNOSIS — E785 Hyperlipidemia, unspecified: Secondary | ICD-10-CM

## 2020-03-31 DIAGNOSIS — F419 Anxiety disorder, unspecified: Secondary | ICD-10-CM

## 2020-03-31 MED ORDER — HYDROXYZINE HCL 25 MG PO TABS: 25.0000 mg | ORAL_TABLET | Freq: Two times a day (BID) | ORAL | 2 refills | Status: DC | PRN

## 2020-03-31 NOTE — Telephone Encounter (Signed)
Perfect. Thanks.

## 2020-03-31 NOTE — Patient Instructions (Signed)
Please start taking Vitamin B12 and folic acid.  Please take medications regularly. Please avoid skipping any medication doses. Please avoid skipping any meal.  You are being referred to Dr Gerilyn Pilgrim for evaluation of seizures.

## 2020-03-31 NOTE — Telephone Encounter (Signed)
Cynthia Mclean with [facility] called needing verbal orders for OT  2x a week for 4 weeks for strength balance and endurance. Verbal ok left on voicemail as this is secure. FYI

## 2020-04-02 ENCOUNTER — Encounter: Payer: Self-pay | Admitting: Internal Medicine

## 2020-04-02 DIAGNOSIS — F419 Anxiety disorder, unspecified: Secondary | ICD-10-CM | POA: Insufficient documentation

## 2020-04-02 NOTE — Assessment & Plan Note (Signed)
On Phenytoin 300 mg QD Referred to Neurology for possible EEG and evaluation for alternative agent

## 2020-04-02 NOTE — Assessment & Plan Note (Signed)
Recent hospitalization with Todd's paralysis Undergoing PT Has some left sided UE and LE weakness Uses walker Advised to avoid alcohol Compliance with antiepileptic emphasized 

## 2020-04-02 NOTE — Assessment & Plan Note (Signed)
On Atorvastatin 20 mg QD 

## 2020-04-02 NOTE — Assessment & Plan Note (Signed)
Prescribed Hydroxyzine 25 mg BID PRN

## 2020-04-02 NOTE — Assessment & Plan Note (Signed)
BP Readings from Last 1 Encounters:  03/31/20 122/68   Well-controlled with Amlodipine Counseled for compliance with the medications Advised DASH diet and moderate exercise/walking, at least 150 mins/week

## 2020-04-02 NOTE — Assessment & Plan Note (Signed)
Care established Previous chart reviewed History and medications reviewed with the patient 

## 2020-04-02 NOTE — Progress Notes (Signed)
New Patient Office Visit  Subjective:  Patient ID: Cynthia Mclean, female    DOB: [birth year]  Age: 54 y.o. MRN: [id number]  CC:  Chief Complaint  Patient presents with  . 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  She is a former patient of Dr. Lodema Hong.  Patient was recently hospitalized for Todd's paralysis, which was found to be provoked by noncompliance to antiepileptic medications and alcohol use.  She is undergoing physical therapy for left-sided weakness.  She states that physical therapy has been helping with her UE and LE weakness.  She takes phenytoin regularly.  She denies any episodes of seizures since being discharged from the hospital.  Patient has a history of hypertension, but had not been taking her medications for long time before the recent hospitalization.  Her blood pressure was 122/68 in the office today.  She takes amlodipine regularly now.  She denies any headache, dizziness, chest pain, dyspnea or palpitations.  Her daughters have been helping to take care of her.  Daughter mentions that she has been anxious recently due to a family situation.  She denies depressed mood or suicidal ideation.  Patient agrees to get Cologuard done for colon cancer screening.     Past Medical History:  Diagnosis Date  . Alcohol dependency (HCC)   . HELICOBACTER PYLORI [H. PYLORI] INFECTION 02/24/2010   Qualifier: Diagnosis of  By: De Blanch.   . Hypertension   . IDA (iron deficiency anemia)    required blood tranfusion  . Nicotine addiction   . Seizure disorder [facility])      Past Surgical History:  Procedure Laterality Date  . btl    . left knee surgery, fracture      Family History  Problem Relation Age of Onset  . Anxiety disorder Sister     Social History   Socioeconomic History  . Marital status: Single    Spouse name: Not on file  . Number of children: 5  . Years of education: Not on file  . Highest education level: Not on file  Occupational History  . Occupation: works at [employer]  . Smoking status: Never Smoker  . Smokeless tobacco: Current User    Types: Snuff  Substance and Sexual Activity  . Alcohol use: Yes    Comment: last drank last night 1 40 oz  . Drug use: No  . Sexual activity: Yes    Birth control/protection: Surgical  Other Topics Concern  . Not on file  Social History Narrative  . Not on file   Social Determinants of Health   Financial Resource Strain:   . Difficulty of Paying Living Expenses: Not on file  Food Insecurity:   . Worried About Programme researcher, broadcasting/film/video in the Last Year: Not on file  . Ran Out of Food in the Last Year: Not on file  Transportation Needs:   . Lack of Transportation (Medical): Not on file  . Lack of Transportation (Non-Medical): Not on file  Physical Activity:   . Days of Exercise per Week: Not on  file  . Minutes of Exercise per Session: Not on file  Stress:   . Feeling of Stress : Not on file  Social Connections:   . Frequency of Communication with Friends and Family: Not on file  . Frequency of Social Gatherings with Friends and Family: Not on file  . Attends Religious Services: Not on file  . Active Member of Clubs or Organizations: Not on file  . Attends Banker Meetings: Not on file  . Marital Status: Not on file  Intimate Partner Violence:   . Fear of Current or Ex-Partner: Not on file  . Emotionally Abused: Not on file  . Physically Abused: Not on file  . Sexually Abused: Not on file    ROS Review of Systems  Constitutional: Negative for  chills and fever.  HENT: Negative for congestion, sinus pressure, sinus pain and sore throat.   Eyes: Negative for pain and discharge.  Respiratory: Negative for cough and shortness of breath.   Cardiovascular: Negative for chest pain and palpitations.  Gastrointestinal: Negative for abdominal pain, constipation, diarrhea, nausea and vomiting.  Endocrine: Negative for polydipsia and polyuria.  Genitourinary: Negative for dysuria and hematuria.  Musculoskeletal: Negative for neck pain and neck stiffness.  Skin: Negative for rash.  Neurological: Positive for seizures and weakness. Negative for dizziness.  Psychiatric/Behavioral: Positive for agitation. Negative for behavioral problems. The patient is nervous/anxious.     Objective:   Today's Vitals: BP 122/68 (BP Location: Right Arm, Patient Position: Sitting, Cuff Size: Normal)   Pulse 85   Temp 98.2 F (36.8 C) (Oral)   Resp 16   SpO2 98%   Physical Exam Vitals reviewed.  Constitutional:      General: She is not in acute distress.    Appearance: She is not diaphoretic.  HENT:     Head: Normocephalic and atraumatic.     Nose: Nose normal.     Mouth/Throat:     Mouth: Mucous membranes are moist.  Eyes:     General: No scleral icterus.    Extraocular Movements: Extraocular movements intact.     Pupils: Pupils are equal, round, and reactive to light.  Cardiovascular:     Rate and Rhythm: Normal rate and regular rhythm.     Pulses: Normal pulses.     Heart sounds: Normal heart sounds. No murmur heard.   Pulmonary:     Breath sounds: Normal breath sounds. No wheezing or rales.  Abdominal:     Palpations: Abdomen is soft.     Tenderness: There is no abdominal tenderness.  Musculoskeletal:     Cervical back: Neck supple. No tenderness.     Right lower leg: No edema.     Left lower leg: No edema.  Skin:    General: Skin is warm.     Findings: No rash.  Neurological:     General: No focal deficit present.     Mental  Status: She is alert and oriented to person, place, and time.     Sensory: No sensory deficit.     Motor: Weakness (Left UE > LE) present.  Psychiatric:        Mood and Affect: Mood normal.        Behavior: Behavior normal.     Assessment & Plan:   Problem List Items Addressed This Visit      Cardiovascular and Mediastinum   Essential hypertension    BP Readings from Last 1 Encounters:  03/31/20 122/68   Well-controlled with Amlodipine  Counseled for compliance with the medications Advised DASH diet and moderate exercise/walking, at least 150 mins/week        Other   Encounter to establish care - Primary    Care established Previous chart reviewed History and medications reviewed with the patient      Hyperlipidemia LDL goal <100    On Atorvastatin 20 mg QD      Seizures (HCC)    On Phenytoin 300 mg QD Referred to Neurology for possible EEG and evaluation for alternative agent      Relevant Orders   Ambulatory referral to Neurology   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 acetaminophen (TYLENOL) 325 MG tablet Take 2 tablets (650 mg total) by mouth every 4 (four) hours as needed for mild pain (or temp >/= 99.5).  Marland Kitchen amLODipine (NORVASC) 5 MG tablet Take 1 tablet (5 mg total) by mouth daily.  Marland Kitchen atorvastatin (LIPITOR) 20 MG tablet Take 1 tablet (20 mg total) by mouth daily.  . ergocalciferol (VITAMIN D2)  50000 UNITS capsule Take 1 capsule (50,000 Units total) by mouth once a week. One capsule once weekly (Patient taking differently: Take 50,000 Units by mouth once a week. )  . Multiple Vitamin (MULTIVITAMIN WITH MINERALS) TABS tablet Take 1 tablet by mouth daily.  . Multiple Vitamins-Minerals (MULTIVITAMIN WITH MINERALS) tablet Take 1 tablet by mouth daily.  . phenytoin (DILANTIN) 300 MG ER capsule Take 1 capsule (300 mg total) by mouth daily.  . hydrOXYzine (ATARAX/VISTARIL) 25 MG tablet Take 1 tablet (25 mg total) by mouth 2 (two) times daily as needed for anxiety.  . [DISCONTINUED] lisinopril (ZESTRIL) 20 MG tablet Take 1 tablet (20 mg total) by mouth daily. (Patient not taking: Reported on 03/31/2020)   No facility-administered encounter medications on file as of 03/31/2020.    Follow-up: No follow-ups on file.   Anabel Halon, MD

## 2020-05-01 ENCOUNTER — Other Ambulatory Visit: Payer: Self-pay | Admitting: *Deleted

## 2020-05-01 ENCOUNTER — Other Ambulatory Visit: Payer: Self-pay

## 2020-05-01 ENCOUNTER — Ambulatory Visit (INDEPENDENT_AMBULATORY_CARE_PROVIDER_SITE_OTHER): Payer: Self-pay | Admitting: Internal Medicine

## 2020-05-01 ENCOUNTER — Encounter: Payer: Self-pay | Admitting: Internal Medicine

## 2020-05-01 VITALS — BP 121/73 | HR 87 | Temp 97.1°F | Resp 18 | Ht 66.0 in | Wt 149.4 lb

## 2020-05-01 DIAGNOSIS — G8384 Todd's paralysis (postepileptic): Secondary | ICD-10-CM

## 2020-05-01 DIAGNOSIS — I10 Essential (primary) hypertension: Secondary | ICD-10-CM

## 2020-05-01 DIAGNOSIS — R569 Unspecified convulsions: Secondary | ICD-10-CM

## 2020-05-01 DIAGNOSIS — F419 Anxiety disorder, unspecified: Secondary | ICD-10-CM

## 2020-05-01 NOTE — Assessment & Plan Note (Signed)
Recent hospitalization with Todd's paralysis Undergoing PT Has some left sided UE and LE weakness Uses walker Advised to avoid alcohol Compliance with antiepileptic emphasized

## 2020-05-01 NOTE — Assessment & Plan Note (Signed)
BP Readings from Last 1 Encounters:  05/01/20 121/73   Well-controlled with Amlodipine Counseled for compliance with the medications Advised DASH diet and moderate exercise/walking, at least 150 mins/week

## 2020-05-01 NOTE — Progress Notes (Signed)
Established Patient Office Visit  Subjective:  Patient ID: Cynthia Mclean, female    DOB: Aug 26, 1965  Age: 55 y.o. MRN: [id number]  CC:  Chief Complaint  Patient presents with  . 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  BP is well-controlled. Takes medications regularly. Patient denies headache, dizziness, chest pain, dyspnea or palpitations.  Patient mentions that her left hand stiffness is still present, but it has been getting better with OT. She denies any numbness or tingling.  She has not had Neurology visit yet, and wants to follow up with a Neurologist locally. Consult changes to Dr Merlene Laughter after discussion with the patient.  Patient still has not sent the Cologuard test back. Patient is advised to send the Cologuard test sooner if possible.  Past Medical History:  Diagnosis Date  . Alcohol dependency ([place])   . HELICOBACTER PYLORI [place] INFECTION 02/24/2010   Qualifier: Diagnosis of  By: Westly Pam.   . Hypertension   . IDA (iron deficiency anemia)    required blood tranfusion  . Nicotine addiction   . Seizure disorder [facility])     Past Surgical History:  Procedure Laterality Date  . btl    . left knee surgery, fracture      Family History  Problem Relation Age of Onset  . Anxiety disorder Sister     Social History   Socioeconomic History  . Marital status: Single    Spouse name: Not on file  . Number of children: 5  . Years of education: Not on file  . Highest education level: Not on file  Occupational History  . Occupation: works at [employer]  . Smoking status: Never Smoker  . Smokeless tobacco: Current User     Types: Snuff  Substance and Sexual Activity  . Alcohol use: Yes    Comment: last drank last night 1 40 oz  . Drug use: No  . Sexual activity: Yes    Birth control/protection: Surgical  Other Topics Concern  . Not on file  Social History Narrative  . Not on file   Social Determinants of Health   Financial Resource Strain: Not on file  Food Insecurity: Not on file  Transportation Needs: Not on file  Physical Activity: Not on file  Stress: Not on file  Social Connections: Not on file  Intimate Partner Violence: Not on file    Outpatient Medications Prior to Visit  Medication Sig Dispense Refill  . acetaminophen (TYLENOL) 325 MG tablet Take 2 tablets (650 mg total) by mouth every 4 (four) hours as needed for mild pain (or temp >/= 99.5). 30 tablet 2  . amLODipine (NORVASC) 5 MG tablet Take 1 tablet (5 mg total) by mouth daily. 30 tablet 3  . atorvastatin (LIPITOR) 20 MG tablet Take 1 tablet (20 mg total) by mouth daily. 30 tablet 11  . ergocalciferol (VITAMIN D2) 50000 UNITS capsule Take 1 capsule (50,000 Units total) by mouth once a week. One capsule once weekly (Patient taking differently: Take 50,000 Units by mouth once a week.) 12 capsule 1  . hydrOXYzine (ATARAX/VISTARIL) 25 MG tablet Take 1 tablet (25 mg total) by mouth  2 (two) times daily as needed for anxiety. 30 tablet 2  . lisinopril (ZESTRIL) 20 MG tablet Take 20 mg by mouth daily.    . Multiple Vitamin (MULTIVITAMIN WITH MINERALS) TABS tablet Take 1 tablet by mouth daily. 120 tablet 3  . Multiple Vitamins-Minerals (MULTIVITAMIN WITH MINERALS) tablet Take 1 tablet by mouth daily. 120 tablet 2  . phenytoin (DILANTIN) 300 MG ER capsule Take 1 capsule (300 mg total) by mouth daily. 30 capsule 11   No facility-administered medications prior to visit.    No Known Allergies  ROS Review of Systems  Constitutional: Negative for chills and fever.  HENT: Negative for congestion, sinus pressure, sinus pain and sore throat.    Eyes: Negative for pain and discharge.  Respiratory: Negative for cough and shortness of breath.   Cardiovascular: Negative for chest pain and palpitations.  Gastrointestinal: Negative for abdominal pain, constipation, diarrhea, nausea and vomiting.  Endocrine: Negative for polydipsia and polyuria.  Genitourinary: Negative for dysuria and hematuria.  Musculoskeletal: Negative for neck pain and neck stiffness.  Skin: Negative for rash.  Neurological: Positive for seizures and weakness. Negative for dizziness.  Psychiatric/Behavioral: Positive for agitation. Negative for behavioral problems. The patient is nervous/anxious.       Objective:    Physical Exam Vitals reviewed.  Constitutional:      General: She is not in acute distress.    Appearance: She is not diaphoretic.  HENT:     Head: Normocephalic and atraumatic.     Nose: Nose normal. No congestion.     Mouth/Throat:     Mouth: Mucous membranes are moist.     Pharynx: No posterior oropharyngeal erythema.  Eyes:     General: No scleral icterus.    Extraocular Movements: Extraocular movements intact.     Pupils: Pupils are equal, round, and reactive to light.  Cardiovascular:     Rate and Rhythm: Normal rate and regular rhythm.     Pulses: Normal pulses.     Heart sounds: Normal heart sounds. No murmur heard.   Pulmonary:     Breath sounds: Normal breath sounds. No wheezing or rales.  Abdominal:     Palpations: Abdomen is soft.     Tenderness: There is no abdominal tenderness.  Musculoskeletal:     Cervical back: Neck supple. No tenderness.     Right lower leg: No edema.     Left lower leg: No edema.  Skin:    General: Skin is warm.     Findings: No rash.  Neurological:     General: No focal deficit present.     Mental Status: She is alert and oriented to person, place, and time.     Sensory: No sensory deficit.     Motor: Weakness (Left UE and LE) present.     Gait: Gait abnormal.  Psychiatric:        Mood  and Affect: Mood normal.        Behavior: Behavior normal.     BP 121/73 (BP Location: Right Arm, Patient Position: Sitting, Cuff Size: Normal)   Pulse 87   Temp (!) 97.1 F (36.2 C) (Oral)   Resp 18   Ht $R'5\' 6"'dF$  (1.676 m)   Wt 149 lb 6.4 oz (67.8 kg)   SpO2 98%   BMI 24.11 kg/m  Wt Readings from Last 3 Encounters:  05/01/20 149 lb 6.4 oz (67.8 kg)  03/12/20 160 lb 15 oz (73 kg)  04/08/15 180 lb 1.9 oz (81.7 kg)  Health Maintenance Due  Topic Date Due  . Hepatitis C Screening  Never done  . COLONOSCOPY (Pts 45-82yrs Insurance coverage will need to be confirmed)  Never done  . TETANUS/TDAP  04/28/2011  . PAP SMEAR-Modifier  01/18/2015  . MAMMOGRAM  03/20/2016    There are no preventive care reminders to display for this patient.  Lab Results  Component Value Date   TSH 1.114 04/11/2015   Lab Results  Component Value Date   WBC 8.0 03/12/2020   HGB 10.3 (L) 03/12/2020   HCT 30.7 (L) 03/12/2020   MCV 101.7 (H) 03/12/2020   PLT 172 03/12/2020   Lab Results  Component Value Date   NA 140 03/12/2020   K 3.6 03/12/2020   CO2 26 03/12/2020   GLUCOSE 92 03/12/2020   BUN 7 03/12/2020   CREATININE 0.52 03/12/2020   BILITOT 3.5 (H) 03/12/2020   ALKPHOS 56 03/12/2020   AST 56 (H) 03/12/2020   ALT 19 03/12/2020   PROT 6.9 03/12/2020   ALBUMIN 2.0 (L) 03/12/2020   CALCIUM 8.5 (L) 03/12/2020   ANIONGAP 7 03/12/2020   Lab Results  Component Value Date   CHOL 188 04/11/2015   Lab Results  Component Value Date   HDL 59 04/11/2015   Lab Results  Component Value Date   LDLCALC 115 04/11/2015   Lab Results  Component Value Date   TRIG 71 04/11/2015   Lab Results  Component Value Date   CHOLHDL 3.2 04/11/2015   No results found for: HGBA1C    Assessment & Plan:   Problem List Items Addressed This Visit      Cardiovascular and Mediastinum   Essential hypertension    BP Readings from Last 1 Encounters:  05/01/20 121/73   Well-controlled with  Amlodipine Counseled for compliance with the medications Advised DASH diet and moderate exercise/walking, at least 150 mins/week      Relevant Medications   lisinopril (ZESTRIL) 20 MG tablet   Other Relevant Orders   CBC with Differential   CMP14+EGFR   Hemoglobin A1c   Lipid panel   TSH     Other   Seizures (HCC)    On Phenytoin 300 mg QD Referred to Neurology locally as per patient preference      Relevant Orders   CMP14+EGFR   Hemoglobin A1c   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   Follow-up: Return in about 3 months (around 07/30/2020) for Annual physical.    Lindell Spar, MD

## 2020-05-01 NOTE — Assessment & Plan Note (Signed)
On Phenytoin 300 mg QD Referred to Neurology locally as per patient preference

## 2020-05-01 NOTE — Patient Instructions (Signed)
Please continue to take medications as prescribed.  Please follow up with Neurology as scheduled.  Continue to participate in physical therapy.  Please get fasting blood tests done before the next visit.

## 2020-05-01 NOTE — Assessment & Plan Note (Signed)
Stable with Atarax PRN

## 2020-05-16 ENCOUNTER — Emergency Department (HOSPITAL_COMMUNITY): Payer: HRSA Program

## 2020-05-16 ENCOUNTER — Emergency Department (HOSPITAL_COMMUNITY)
Admission: EM | Admit: 2020-05-16 | Discharge: 2020-05-16 | Disposition: A | Discharge status: Home / Self Care | Payer: HRSA Program | Attending: Emergency Medicine | Admitting: Emergency Medicine

## 2020-05-16 ENCOUNTER — Encounter (HOSPITAL_COMMUNITY): Payer: Self-pay

## 2020-05-16 ENCOUNTER — Other Ambulatory Visit: Payer: Self-pay

## 2020-05-16 DIAGNOSIS — U071 COVID-19: Secondary | ICD-10-CM | POA: Diagnosis not present

## 2020-05-16 DIAGNOSIS — F17220 Nicotine dependence, chewing tobacco, uncomplicated: Secondary | ICD-10-CM | POA: Diagnosis not present

## 2020-05-16 DIAGNOSIS — I10 Essential (primary) hypertension: Secondary | ICD-10-CM | POA: Insufficient documentation

## 2020-05-16 DIAGNOSIS — Z79899 Other long term (current) drug therapy: Secondary | ICD-10-CM | POA: Diagnosis not present

## 2020-05-16 DIAGNOSIS — R509 Fever, unspecified: Secondary | ICD-10-CM | POA: Diagnosis present

## 2020-05-16 LAB — CBC WITH DIFFERENTIAL/PLATELET
Abs Immature Granulocytes: 0.04 10*3/uL (ref 0.00–0.07)
Basophils Absolute: 0.1 10*3/uL (ref 0.0–0.1)
Basophils Relative: 1 %
Eosinophils Absolute: 0.1 10*3/uL (ref 0.0–0.5)
Eosinophils Relative: 0 %
HCT: 34.2 % — ABNORMAL LOW (ref 36.0–46.0)
Hemoglobin: 11.3 g/dL — ABNORMAL LOW (ref 12.0–15.0)
Immature Granulocytes: 0 %
Lymphocytes Relative: 13 %
Lymphs Abs: 1.5 10*3/uL (ref 0.7–4.0)
MCH: 31.2 pg (ref 26.0–34.0)
MCHC: 33 g/dL (ref 30.0–36.0)
MCV: 94.5 fL (ref 80.0–100.0)
Monocytes Absolute: 4.3 10*3/uL — ABNORMAL HIGH (ref 0.1–1.0)
Monocytes Relative: 36 %
Neutro Abs: 6 10*3/uL (ref 1.7–7.7)
Neutrophils Relative %: 50 %
Platelets: 165 10*3/uL (ref 150–400)
RBC: 3.62 MIL/uL — ABNORMAL LOW (ref 3.87–5.11)
RDW: 15.7 % — ABNORMAL HIGH (ref 11.5–15.5)
WBC: 12 10*3/uL — ABNORMAL HIGH (ref 4.0–10.5)
nRBC: 0 % (ref 0.0–0.2)

## 2020-05-16 LAB — COMPREHENSIVE METABOLIC PANEL
ALT: 21 U/L (ref 0–44)
AST: 44 U/L — ABNORMAL HIGH (ref 15–41)
Albumin: 2.9 g/dL — ABNORMAL LOW (ref 3.5–5.0)
Alkaline Phosphatase: 40 U/L (ref 38–126)
Anion gap: 7 (ref 5–15)
BUN: 17 mg/dL (ref 6–20)
CO2: 22 mmol/L (ref 22–32)
Calcium: 9.8 mg/dL (ref 8.9–10.3)
Chloride: 103 mmol/L (ref 98–111)
Creatinine, Ser: 0.89 mg/dL (ref 0.44–1.00)
GFR, Estimated: >60 mL/min (ref >60)
Glucose, Bld: 131 mg/dL — ABNORMAL HIGH (ref 70–99)
Potassium: 4.3 mmol/L (ref 3.5–5.1)
Sodium: 132 mmol/L — ABNORMAL LOW (ref 135–145)
Total Bilirubin: 1 mg/dL (ref 0.3–1.2)
Total Protein: 8.1 g/dL (ref 6.5–8.1)

## 2020-05-16 LAB — SARS CORONAVIRUS 2 BY RT PCR (HOSPITAL ORDER, PERFORMED IN ~~LOC~~ HOSPITAL LAB): SARS Coronavirus 2: POSITIVE — AB

## 2020-05-16 MED ORDER — ACETAMINOPHEN 325 MG PO TABS
650.0000 mg | ORAL_TABLET | Freq: Once | ORAL | Status: AC
  Administered 2020-05-16: 650 mg via ORAL
  Filled 2020-05-16: qty 2

## 2020-05-16 MED ORDER — SODIUM CHLORIDE 0.9 % IV BOLUS
1000.0000 mL | Freq: Once | INTRAVENOUS | Status: AC
  Administered 2020-05-16: 1000 mL via INTRAVENOUS

## 2020-05-16 NOTE — ED Notes (Signed)
Rounded on pt and to ask pt for urine specimen. Pt appears lethargic and was difficult to arouse but did open her eyes. Pt went immediately back to sleep. EDP made aware. Will continue to monitor.

## 2020-05-16 NOTE — ED Provider Notes (Signed)
[facility] EMERGENCY DEPARTMENT Provider Note   CSN: [id number] Arrival date & time: 05/16/20  8299     History Chief Complaint  Patient presents with   Fever    Cynthia Mclean is a 55 y.o. female.  HPI   55 year old female past medical history of HTN, HLD, chronic anemia, epilepsy presents the emergency department with generalized weakness and reported fever.  Patient states this started a little over a day ago.  She feels globally fatigued.  Denies any acute symptoms including headache, sore throat, cough, chest pain, abdominal pain, GI symptoms.  She states she feels slightly improved since being here.  Did not take any medications prior to arrival.  Past Medical History:  Diagnosis Date   Alcohol dependency (HCC)    HELICOBACTER PYLORI [H. PYLORI] INFECTION 02/24/2010   Qualifier: Diagnosis of  By: De Blanch.    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 03/11/2020   Alcoholic intoxication with complication (HCC) 03/11/2020   Alcohol abuse with intoxication (HCC) 03/11/2020   Hyperlipidemia LDL goal <100 08/16/2013   Carotid bruit 08/14/2013   Vitamin D deficiency 01/16/2013   Encounter to establish care 01/30/2012   Essential hypertension 04/21/2010   Iron deficiency anemia 12/01/2009    Past Surgical History:  Procedure Laterality Date   btl     left knee surgery, fracture       OB History    Gravida  5   Para  5   Term  5   Preterm      AB      Living  5     SAB      IAB      Ectopic      Multiple      Live Births              Family History  Problem Relation Age of Onset   Anxiety disorder Sister     Social  History   Tobacco Use   Smoking status: Never Smoker   Smokeless tobacco: Current User    Types: Snuff  Substance Use Topics   Alcohol use: Yes    Comment: last drank last night 1 40 oz   Drug use: No    Home Medications Prior to Admission medications   Medication Sig Start Date End Date Taking? Authorizing Provider  acetaminophen (TYLENOL) 325 MG tablet Take 2 tablets (650 mg total) by mouth every 4 (four) hours as needed for mild pain (or temp >/= 99.5). 03/13/20   Shon Hale, MD  amLODipine (NORVASC) 5 MG tablet Take 1 tablet (5 mg total) by mouth daily. 03/14/20   Shon Hale, MD  atorvastatin (LIPITOR) 20 MG tablet Take 1 tablet (20 mg total) by mouth daily. 03/13/20   Shon Hale, MD  ergocalciferol (VITAMIN D2) 50000 UNITS capsule Take 1 capsule (50,000 Units total) by mouth once a week. One capsule once weekly Patient taking differently: Take 50,000 Units by mouth once a week. 02/26/14   Kerri Perches, MD  hydrOXYzine (ATARAX/VISTARIL) 25 MG tablet Take 1 tablet (25 mg total) by mouth 2 (two) times daily as needed for anxiety. 03/31/20   Allena Katz, Dondra Prader  K, MD  lisinopril (ZESTRIL) 20 MG tablet Take 20 mg by mouth daily. 04/10/20   [provider]  Multiple Vitamin (MULTIVITAMIN WITH MINERALS) TABS tablet Take 1 tablet by mouth daily. 03/14/20   Shon Hale, MD  Multiple Vitamins-Minerals (MULTIVITAMIN WITH MINERALS) tablet Take 1 tablet by mouth daily. 03/13/20 03/13/21  Shon Hale, MD  phenytoin (DILANTIN) 300 MG ER capsule Take 1 capsule (300 mg total) by mouth daily. 03/13/20   Shon Hale, MD    Allergies    Patient has no known allergies.  Review of Systems   Review of Systems  Constitutional: Positive for appetite change and fatigue. Negative for chills and fever.  HENT: Negative for congestion.   Eyes: Negative for visual disturbance.  Respiratory: Negative for shortness of breath.   Cardiovascular: Negative for chest  pain.  Gastrointestinal: Negative for abdominal pain, diarrhea and vomiting.  Genitourinary: Negative for dysuria.  Skin: Negative for rash.  Neurological: Negative for headaches.    Physical Exam Updated Vital Signs BP 134/66    Pulse 100    Temp 98.5 F (36.9 C) (Oral)    Resp (!) 22    Ht 5\' 6"  (1.676 m)    Wt 67.8 kg    SpO2 99%    BMI 24.11 kg/m   Physical Exam Vitals and nursing note reviewed.  Constitutional:      Appearance: Normal appearance.     Comments: Fatigued appearing but nontoxic  HENT:     Head: Normocephalic.     Mouth/Throat:     Mouth: Mucous membranes are moist.  Cardiovascular:     Rate and Rhythm: Normal rate.  Pulmonary:     Effort: Pulmonary effort is normal. No respiratory distress.  Abdominal:     Palpations: Abdomen is soft.     Tenderness: There is no abdominal tenderness.  Skin:    General: Skin is warm.  Neurological:     Mental Status: She is alert and oriented to person, place, and time. Mental status is at baseline.  Psychiatric:        Mood and Affect: Mood normal.     ED Results / Procedures / Treatments   Labs (all labs ordered are listed, but only abnormal results are displayed) Labs Reviewed  CBC WITH DIFFERENTIAL/PLATELET - Abnormal; Notable for the following components:      Result Value   WBC 12.0 (*)    RBC 3.62 (*)    Hemoglobin 11.3 (*)    HCT 34.2 (*)    RDW 15.7 (*)    Monocytes Absolute 4.3 (*)    All other components within normal limits  COMPREHENSIVE METABOLIC PANEL - Abnormal; Notable for the following components:   Sodium 132 (*)    Glucose, Bld 131 (*)    Albumin 2.9 (*)    AST 44 (*)    All other components within normal limits  URINALYSIS, ROUTINE W REFLEX MICROSCOPIC    EKG None  Radiology DG Chest Port 1 View  Result Date: 05/16/2020 CLINICAL DATA:  Fever EXAM: PORTABLE CHEST 1 VIEW COMPARISON:  March 11, 2020 FINDINGS: There is mild right base atelectasis. There is no edema or airspace  opacity. Heart is upper normal in size with pulmonary vascularity normal. No adenopathy. No bone lesions. IMPRESSION: Mild right base atelectasis. No edema or airspace opacity. Stable cardiac silhouette. Electronically Signed   By: March 13, 2020 III M.D.   On: 05/16/2020 10:49    Procedures Procedures (including critical care time)  Medications  Ordered in ED Medications  acetaminophen (TYLENOL) tablet 650 mg (650 mg Oral Given 05/16/20 1050)    ED Course  I have reviewed the triage vital signs and the nursing notes.  Pertinent labs & imaging results that were available during my care of the patient were reviewed by me and considered in my medical decision making (see chart for details).    MDM Rules/Calculators/A&P                          On arrival patient is febrile, extremely fatigued appearing.  She is awake, alert and oriented and cooperative.  Blood work shows a mild leukocytosis, baseline anemia.  After liter fluids patient feels significantly better, she is more alert.  Patient is noted to be COVID-positive.  Fever responded to Tylenol.  She has no hypoxia or tachycardia, she states that she feels significantly improved and wants to go home.  No concern for PE or other complications at this time.  Discussed with the patient Covid infection.  Educated the patient on Covid precautions, treatments and expectations.  Patient is stable for discharge and treatment as an outpatient. Patient agrees with the discharge plan/strict return precautions and verbalizes understanding. Final Clinical Impression(s) / ED Diagnoses Final diagnoses:  None    Rx / DC Orders ED Discharge Orders    None       Rozelle Logan, DO 05/16/20 1548

## 2020-05-16 NOTE — ED Notes (Signed)
CRITICAL VALUE ALERT  Critical Value:  COVID +  Date & Time Notied:  05/16/2020 1415  Provider Notified: Dr. Wilkie Aye  Orders Received/Actions taken: see chart

## 2020-05-16 NOTE — Discharge Instructions (Addendum)
You have been evaluated in the Emergency Department. You are Covid positive.  You are to isolate and quarantine yourself.  Please refer to the information attached with this packet.  Treat yourself symptomatically with over the counter medication as you would for a viral illness.  Stay well-hydrated.  Call the attached clinic to see what your treatment options are.  If you have any worsening or severe symptoms, difficulty breathing, concern for your health please return to the emergency department.

## 2020-05-16 NOTE — ED Triage Notes (Signed)
Pt presents to ED via [facility] for fever up to 103.7, not eating, generalized weakness, urine dark and cloudy per family, pt not ambulating since yesterday.

## 2020-05-28 ENCOUNTER — Other Ambulatory Visit: Payer: Self-pay

## 2020-05-28 ENCOUNTER — Telehealth: Payer: Medicaid Other | Admitting: Family Medicine

## 2020-06-23 ENCOUNTER — Ambulatory Visit: Payer: Medicaid Other | Admitting: Neurology

## 2020-06-23 ENCOUNTER — Encounter: Payer: Self-pay | Admitting: Neurology

## 2020-06-23 ENCOUNTER — Telehealth: Payer: Self-pay | Admitting: *Deleted

## 2020-06-23 NOTE — Telephone Encounter (Signed)
No showed new patient appointment. 

## 2020-07-30 ENCOUNTER — Encounter: Payer: Self-pay | Admitting: Internal Medicine

## 2020-08-05 ENCOUNTER — Other Ambulatory Visit: Payer: Self-pay

## 2020-08-05 ENCOUNTER — Emergency Department (HOSPITAL_COMMUNITY)
Admission: EM | Admit: 2020-08-05 | Discharge: 2020-08-05 | Disposition: A | Discharge status: Home / Self Care | Payer: Medicaid Other | Attending: Emergency Medicine | Admitting: Emergency Medicine

## 2020-08-05 ENCOUNTER — Emergency Department (HOSPITAL_COMMUNITY): Payer: Medicaid Other

## 2020-08-05 DIAGNOSIS — R410 Disorientation, unspecified: Secondary | ICD-10-CM | POA: Insufficient documentation

## 2020-08-05 DIAGNOSIS — N3000 Acute cystitis without hematuria: Secondary | ICD-10-CM | POA: Insufficient documentation

## 2020-08-05 DIAGNOSIS — I10 Essential (primary) hypertension: Secondary | ICD-10-CM | POA: Insufficient documentation

## 2020-08-05 DIAGNOSIS — Z79899 Other long term (current) drug therapy: Secondary | ICD-10-CM | POA: Insufficient documentation

## 2020-08-05 DIAGNOSIS — R55 Syncope and collapse: Secondary | ICD-10-CM | POA: Insufficient documentation

## 2020-08-05 DIAGNOSIS — R531 Weakness: Secondary | ICD-10-CM | POA: Insufficient documentation

## 2020-08-05 DIAGNOSIS — Z20822 Contact with and (suspected) exposure to covid-19: Secondary | ICD-10-CM | POA: Insufficient documentation

## 2020-08-05 DIAGNOSIS — R4182 Altered mental status, unspecified: Secondary | ICD-10-CM | POA: Insufficient documentation

## 2020-08-05 DIAGNOSIS — F17200 Nicotine dependence, unspecified, uncomplicated: Secondary | ICD-10-CM | POA: Insufficient documentation

## 2020-08-05 LAB — CBC WITH DIFFERENTIAL/PLATELET
Abs Immature Granulocytes: 0.05 10*3/uL (ref 0.00–0.07)
Basophils Absolute: 0.1 10*3/uL (ref 0.0–0.1)
Basophils Relative: 1 %
Eosinophils Absolute: 0.1 10*3/uL (ref 0.0–0.5)
Eosinophils Relative: 1 %
HCT: 28.4 % — ABNORMAL LOW (ref 36.0–46.0)
Hemoglobin: 9.4 g/dL — ABNORMAL LOW (ref 12.0–15.0)
Immature Granulocytes: 0 %
Lymphocytes Relative: 19 %
Lymphs Abs: 2.3 10*3/uL (ref 0.7–4.0)
MCH: 33.7 pg (ref 26.0–34.0)
MCHC: 33.1 g/dL (ref 30.0–36.0)
MCV: 101.8 fL — ABNORMAL HIGH (ref 80.0–100.0)
Monocytes Absolute: 1.9 10*3/uL — ABNORMAL HIGH (ref 0.1–1.0)
Monocytes Relative: 15 %
Neutro Abs: 7.9 10*3/uL — ABNORMAL HIGH (ref 1.7–7.7)
Neutrophils Relative %: 64 %
Platelets: 169 10*3/uL (ref 150–400)
RBC: 2.79 MIL/uL — ABNORMAL LOW (ref 3.87–5.11)
RDW: 14.5 % (ref 11.5–15.5)
WBC: 12.4 10*3/uL — ABNORMAL HIGH (ref 4.0–10.5)
nRBC: 0 % (ref 0.0–0.2)

## 2020-08-05 LAB — RAPID URINE DRUG SCREEN, HOSP PERFORMED
Amphetamines: NOT DETECTED
Barbiturates: NOT DETECTED
Benzodiazepines: NOT DETECTED
Cocaine: NOT DETECTED
Opiates: NOT DETECTED
Tetrahydrocannabinol: NOT DETECTED

## 2020-08-05 LAB — COMPREHENSIVE METABOLIC PANEL
ALT: 14 U/L (ref 0–44)
AST: 21 U/L (ref 15–41)
Albumin: 2.5 g/dL — ABNORMAL LOW (ref 3.5–5.0)
Alkaline Phosphatase: 48 U/L (ref 38–126)
Anion gap: 8 (ref 5–15)
BUN: 14 mg/dL (ref 6–20)
CO2: 24 mmol/L (ref 22–32)
Calcium: 9.1 mg/dL (ref 8.9–10.3)
Chloride: 105 mmol/L (ref 98–111)
Creatinine, Ser: 0.74 mg/dL (ref 0.44–1.00)
GFR, Estimated: >60 mL/min (ref >60)
Glucose, Bld: 104 mg/dL — ABNORMAL HIGH (ref 70–99)
Potassium: 4.3 mmol/L (ref 3.5–5.1)
Sodium: 137 mmol/L (ref 135–145)
Total Bilirubin: 1.2 mg/dL (ref 0.3–1.2)
Total Protein: 7.1 g/dL (ref 6.5–8.1)

## 2020-08-05 LAB — URINALYSIS, COMPLETE (UACMP) WITH MICROSCOPIC
Bilirubin Urine: NEGATIVE
Glucose, UA: NEGATIVE mg/dL
Hgb urine dipstick: NEGATIVE
Ketones, ur: NEGATIVE mg/dL
Nitrite: NEGATIVE
Protein, ur: NEGATIVE mg/dL
Specific Gravity, Urine: 1.015 (ref 1.005–1.030)
pH: 6 (ref 5.0–8.0)

## 2020-08-05 LAB — RESP PANEL BY RT-PCR (FLU A&B, COVID) ARPGX2
Influenza A by PCR: NEGATIVE
Influenza B by PCR: NEGATIVE
SARS Coronavirus 2 by RT PCR: NEGATIVE

## 2020-08-05 LAB — TROPONIN I (HIGH SENSITIVITY): Troponin I (High Sensitivity): 3 ng/L (ref <18)

## 2020-08-05 LAB — CBG MONITORING, ED: Glucose-Capillary: 82 mg/dL (ref 70–99)

## 2020-08-05 LAB — PHENYTOIN LEVEL, TOTAL: Phenytoin Lvl: 34.6 ug/mL (ref 10.0–20.0)

## 2020-08-05 LAB — ETHANOL: Alcohol, Ethyl (B): <10 mg/dL (ref <10)

## 2020-08-05 MED ORDER — SODIUM CHLORIDE 0.9 % IV SOLN
1.0000 g | Freq: Once | INTRAVENOUS | Status: AC
  Administered 2020-08-05: 1 g via INTRAVENOUS
  Filled 2020-08-05: qty 10

## 2020-08-05 MED ORDER — CEPHALEXIN 500 MG PO CAPS: 500.0000 mg | ORAL_CAPSULE | Freq: Two times a day (BID) | ORAL | 0 refills | Status: DC

## 2020-08-05 MED ORDER — SODIUM CHLORIDE 0.9 % IV BOLUS
500.0000 mL | Freq: Once | INTRAVENOUS | Status: AC
  Administered 2020-08-05: 500 mL via INTRAVENOUS

## 2020-08-05 NOTE — ED Notes (Signed)
Spoke with patients daughter Eather Colas who states that pt began having weakness and some confusion last week. Patient's nephew stays with her and helps daughter to care for her. Daughter reports that patient has always been able to make her needs known and had clear speech until this past week.

## 2020-08-05 NOTE — Discharge Instructions (Addendum)
Take your next dose of the antibiotic prescribed tomorrow morning.  Make sure you are drinking plenty of fluids while you are trying to clear this infection from your bladder.  Get rechecked immediately if you develop fevers, nausea or uncontrolled vomiting, these can be symptoms of a worsening infection.  As discussed your Dilantin level today is elevated, you numbers 34.6 ug/ml.  This should not be causing any symptoms for you, however I do recommend close follow-up with your primary doctor to recheck this number and determine whether you need an adjustment of this medication.  Make sure you are taking it daily as prescribed, but only once daily as is prescribed.  It is also recommended that you receive follow-up care with the neurologist who can better treat your seizure disorder.  You have been given referral to Dr. Gerilyn Pilgrim for this.

## 2020-08-05 NOTE — ED Provider Notes (Signed)
[facility] EMERGENCY DEPARTMENT Provider Note   CSN: [id number] Arrival date & time: 08/05/20  1239     History Chief Complaint  Patient presents with  . Near Syncope  . Altered Mental Status    Cynthia Mclean is a 55 y.o. 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 Pt reports she was walking using her walker in her home when she felt weak and her caregiver helped her sit in a wheelchair to prevent fall. Event occurred just prior to arrival.  She denies syncope, also denies headache, neck pain, no cp, sob, palpitations and denies LOC.  She also denies any current etoh use and no suggestion of seizure today.    7:37 PM Daughter, Murlean Hark Pettiford now at bedside confirms pt has been having intermittent weakness and confusion, making statements that don't make sense and responding inappropriately to conversations since last week, worsened this am.  Did not witness the event but cousin who was there thought she had a seizure.  Appears to be at her baseline mental status currently per dg.    HPI     Past Medical History:  Diagnosis Date  . Alcohol dependency (HCC)   . HELICOBACTER PYLORI [H. PYLORI] INFECTION 02/24/2010   Qualifier: Diagnosis of  By: De Blanch.   . Hypertension   . IDA (iron deficiency anemia)    required blood tranfusion  . Nicotine addiction   . Seizure disorder [facility])     Patient Active Problem List   Diagnosis Date Noted  . Anxiety 04/02/2020  . Seizures (HCC) 03/11/2020  . 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 Alcoholic intoxication with complication (HCC) 03/11/2020  . Alcohol abuse with intoxication (HCC)  03/11/2020  . Hyperlipidemia LDL goal <100 08/16/2013  . Carotid bruit 08/14/2013  . Vitamin D deficiency 01/16/2013  . Encounter to establish care 01/30/2012  . Essential hypertension 04/21/2010  . Iron deficiency anemia 12/01/2009    Past Surgical History:  Procedure Laterality Date  . btl    . left knee surgery, fracture       OB History    Gravida  5   Para  5   Term  5   Preterm      AB      Living  5     SAB      IAB      Ectopic      Multiple      Live Births              Family History  Problem Relation Age of Onset  . Anxiety disorder Sister     Social History   Tobacco Use  . Smoking status: Never Smoker  . Smokeless tobacco: Current User    Types: Snuff  Substance Use Topics  . Alcohol use: Yes    Comment: last drank last night 1 40 oz  . Drug use: No    Home Medications Prior to Admission medications   Medication Sig Start Date End Date Taking? Authorizing Provider  acetaminophen (TYLENOL) 325 MG tablet Take 2 tablets (650 mg total) by mouth every 4 (four) hours as needed for mild pain (or temp >/= 99.5). 03/13/20  Yes Emokpae, Courage,  MD  amLODipine (NORVASC) 5 MG tablet Take 1 tablet (5 mg total) by mouth daily. 03/14/20  Yes Emokpae, Courage, MD  atorvastatin (LIPITOR) 20 MG tablet Take 1 tablet (20 mg total) by mouth daily. 03/13/20  Yes Emokpae, Courage, MD  cephALEXin (KEFLEX) 500 MG capsule Take 1 capsule (500 mg total) by mouth 2 (two) times daily. 08/05/20  Yes Breanna Mcdaniel, Raynelle Fanning, PA-C  ergocalciferol (VITAMIN D2) 50000 UNITS capsule Take 1 capsule (50,000 Units total) by mouth once a week. One capsule once weekly Patient taking differently: Take 50,000 Units by mouth once a week. 02/26/14  Yes Kerri Perches, MD  Famotidine (PEPCID PO) Take 1 tablet by mouth daily as needed (indigestion).   Yes [provider]  hydrOXYzine (ATARAX/VISTARIL) 25 MG tablet Take 1 tablet (25 mg total) by mouth 2 (two) times daily as needed  for anxiety. 03/31/20  Yes Anabel Halon, MD  lisinopril (ZESTRIL) 20 MG tablet Take 20 mg by mouth daily. 04/10/20  Yes [provider]  phenytoin (DILANTIN) 300 MG ER capsule Take 1 capsule (300 mg total) by mouth daily. 03/13/20  Yes Shon Hale, MD  Multiple Vitamin (MULTIVITAMIN WITH MINERALS) TABS tablet Take 1 tablet by mouth daily. Patient not taking: Reported on 08/05/2020 03/14/20   Shon Hale, MD  Multiple Vitamins-Minerals (MULTIVITAMIN WITH MINERALS) tablet Take 1 tablet by mouth daily. 03/13/20 03/13/21  Shon Hale, MD    Allergies    Patient has no known allergies.  Review of Systems   Review of Systems  Constitutional: Negative for fever.  HENT: Negative for congestion and sore throat.   Eyes: Negative.   Respiratory: Negative for chest tightness and shortness of breath.   Cardiovascular: Negative for chest pain.  Gastrointestinal: Negative for abdominal pain and nausea.  Genitourinary: Negative.   Musculoskeletal: Negative for arthralgias, joint swelling and neck pain.  Skin: Negative.  Negative for rash and wound.  Neurological: Positive for speech difficulty, weakness and light-headedness. Negative for dizziness, seizures, facial asymmetry, numbness and headaches.       Near syncope.  Psychiatric/Behavioral: Negative.   All other systems reviewed and are negative.   Physical Exam Updated Vital Signs BP 119/71 (BP Location: Left Arm)   Pulse 79   Temp 98.2 F (36.8 C) (Oral)   Resp 12   Ht 5\' 6"  (1.676 m)   Wt 56.7 kg   SpO2 100%   BMI 20.18 kg/m   Physical Exam Vitals and nursing note reviewed.  Constitutional:      Appearance: She is well-developed.  HENT:     Head: Normocephalic and atraumatic.  Eyes:     Conjunctiva/sclera: Conjunctivae normal.  Cardiovascular:     Rate and Rhythm: Normal rate and regular rhythm.     Heart sounds: Normal heart sounds.  Pulmonary:     Effort: Pulmonary effort is normal.     Breath  sounds: Normal breath sounds. No wheezing.  Abdominal:     General: Bowel sounds are normal.     Palpations: Abdomen is soft.     Tenderness: There is no abdominal tenderness.  Musculoskeletal:        General: Normal range of motion.     Cervical back: Normal range of motion.  Skin:    General: Skin is warm and dry.  Neurological:     General: No focal deficit present.     Mental Status: She is alert.     Cranial Nerves: Cranial nerves are intact. No cranial nerve deficit or  facial asymmetry.     Motor: Weakness present.     Coordination: Heel to [facility] Test normal.     Comments: Generalized weakness, weak grip strength bilaterally, slight reduced left, chronic per chart. No pronator drift.  Moves all extremity without neglect.  Speech content occasionally confused and mumbling, followed by clear, concise speech pattern.     ED Results / Procedures / Treatments   Labs (all labs ordered are listed, but only abnormal results are displayed) Labs Reviewed  COMPREHENSIVE METABOLIC PANEL - Abnormal; Notable for the following components:      Result Value   Glucose, Bld 104 (*)    Albumin 2.5 (*)    All other components within normal limits  CBC WITH DIFFERENTIAL/PLATELET - Abnormal; Notable for the following components:   WBC 12.4 (*)    RBC 2.79 (*)    Hemoglobin 9.4 (*)    HCT 28.4 (*)    MCV 101.8 (*)    Neutro Abs 7.9 (*)    Monocytes Absolute 1.9 (*)    All other components within normal limits  URINALYSIS, COMPLETE (UACMP) WITH MICROSCOPIC - Abnormal; Notable for the following components:   APPearance CLOUDY (*)    Leukocytes,Ua SMALL (*)    Bacteria, UA MANY (*)    All other components within normal limits  PHENYTOIN LEVEL, TOTAL - Abnormal; Notable for the following components:   Phenytoin Lvl 34.6 (*)    All other components within normal limits  RESP PANEL BY RT-PCR (FLU A&B, COVID) ARPGX2  URINE CULTURE  ETHANOL  RAPID URINE DRUG SCREEN, HOSP PERFORMED  CBG  MONITORING, ED  TROPONIN I (HIGH SENSITIVITY)    EKG EKG Interpretation  Date/Time:  Tuesday August 05 2020 13:59:43 EDT Ventricular Rate:  74 PR Interval:    QRS Duration: 103 QT Interval:  391 QTC Calculation: 434 R Axis:   65 Text Interpretation: Sinus rhythm Abnormal R-wave progression, early transition No significant change from prior tracing Confirmed by Alvester Chou [phone]) on 08/05/2020 3:18:00 PM   Radiology CT Head Wo Contrast  Result Date: 08/05/2020 CLINICAL DATA:  Mental status change.  Unknown cause. EXAM: CT HEAD WITHOUT CONTRAST TECHNIQUE: Contiguous axial images were obtained from the base of the skull through the vertex without intravenous contrast. COMPARISON:  CT head and MRI March 11, 2020. FINDINGS: Brain: No evidence of acute large vascular territory infarction, hemorrhage, hydrocephalus, extra-axial collection or mass lesion/mass effect. Similar age advanced atrophy, particularly of the cerebellum. Vascular: No hyperdense vessel identified. Calcific atherosclerosis. Skull: No acute fracture Sinuses/Orbits: Visualized sinuses are clear.  Unremarkable orbits. Other: No mastoid effusions. IMPRESSION: 1. No evidence of acute intracranial abnormality. 2. Similar age advanced atrophy, particularly of the cerebellum. Electronically Signed   By: Feliberto Harts MD   On: 08/05/2020 13:59    Procedures Procedures   Medications Ordered in ED Medications  sodium chloride 0.9 % bolus 500 mL (0 mLs Intravenous Stopped 08/05/20 1520)  cefTRIAXone (ROCEPHIN) 1 g in sodium chloride 0.9 % 100 mL IVPB (0 g Intravenous Stopped 08/05/20 1828)    ED Course  I have reviewed the triage vital signs and the nursing notes.  Pertinent labs & imaging results that were available during my care of the patient were reviewed by me and considered in my medical decision making (see chart for details).    MDM Rules/Calculators/A&P  Labs and imaging discussed  with pt and dg at bedside.  UTI, culture ordered.  She was given IV rocephin. Also she has a supratherapeutic dilantin level.  She confirms she takes one 30 mg ER tablet daily as prescribed.  She does not have a neurologist - her meds are prescribed by pcp Dr. Allena KatzPatel, however current med bottles on bedside - all meds written by Dr. Mariea ClontsEmokpae from admission 2 months ago.    Discussed supratherapeutic dilantin level with Dr. Derry LoryKhaliqdina of neurology - should not be causing side effects for this pt/ doubt dilantin toxicity, esp since this is not a new medicine for her.  Would not change her dosing have her hold dosing but would plan f/u as outpt with neurology.    Pt with uti, no suggestion of urosepsis, pt currently at baseline without confusion per dg at bedside.  Prescribed keflex.  Return precautions outlined.  Pt reports has appt with Dr. Allena KatzPatel in 3 days.  Referral was also given to Dr. Gerilyn Pilgr[place].  Final Clinical Impression(s) / ED Diagnoses Final diagnoses:  Acute cystitis without hematuria    Rx / DC Orders ED Discharge Orders         Ordered    cephALEXin (KEFLEX) 500 MG capsule  2 times daily        08/05/20 1934           Victoriano Laindol, Margaurite Salido, PA-C 08/05/20 [id number]    Zammit, Joseph, MD 08/06/20 1736

## 2020-08-05 NOTE — ED Triage Notes (Signed)
Altered mental status, dizziness word salad slurring words, care giver wanted her to be seen.

## 2020-08-08 LAB — URINE CULTURE: Culture: >=100000 — AB

## 2020-09-02 ENCOUNTER — Encounter: Payer: Self-pay | Admitting: Internal Medicine

## 2020-09-02 ENCOUNTER — Ambulatory Visit (INDEPENDENT_AMBULATORY_CARE_PROVIDER_SITE_OTHER): Payer: Self-pay | Admitting: Internal Medicine

## 2020-09-02 ENCOUNTER — Other Ambulatory Visit: Payer: Self-pay

## 2020-09-02 VITALS — BP 110/63 | HR 61 | Temp 98.2°F | Resp 18 | Ht 63.0 in | Wt 119.0 lb

## 2020-09-02 DIAGNOSIS — E559 Vitamin D deficiency, unspecified: Secondary | ICD-10-CM

## 2020-09-02 DIAGNOSIS — Z1231 Encounter for screening mammogram for malignant neoplasm of breast: Secondary | ICD-10-CM

## 2020-09-02 DIAGNOSIS — Z1211 Encounter for screening for malignant neoplasm of colon: Secondary | ICD-10-CM

## 2020-09-02 DIAGNOSIS — D509 Iron deficiency anemia, unspecified: Secondary | ICD-10-CM

## 2020-09-02 DIAGNOSIS — I10 Essential (primary) hypertension: Secondary | ICD-10-CM

## 2020-09-02 DIAGNOSIS — Z124 Encounter for screening for malignant neoplasm of cervix: Secondary | ICD-10-CM

## 2020-09-02 DIAGNOSIS — G8384 Todd's paralysis (postepileptic): Secondary | ICD-10-CM

## 2020-09-02 DIAGNOSIS — R29898 Other symptoms and signs involving the musculoskeletal system: Secondary | ICD-10-CM

## 2020-09-02 MED ORDER — IRON (FERROUS SULFATE) 325 (65 FE) MG PO TABS: 325.0000 mg | ORAL_TABLET | Freq: Every day | ORAL | 5 refills | Status: DC

## 2020-09-02 NOTE — Assessment & Plan Note (Signed)
Has residual left-sided UEs and LE weakness Uses walker Compliance with antiepileptic emphasized Advised to follow-up with Dr. Gerilyn Pilgrim On phenytoin

## 2020-09-02 NOTE — Assessment & Plan Note (Signed)
BP Readings from Last 1 Encounters:  09/02/20 110/63   Well-controlled with Amlodipine Counseled for compliance with the medications Advised DASH diet and moderate exercise/walking as tolerated

## 2020-09-02 NOTE — Progress Notes (Signed)
Established Patient Office Visit  Subjective:  Patient ID: Cynthia Mclean, female    DOB: 04-15-1966  Age: 55 y.o. MRN: [id number]  CC:  Chief Complaint  Patient presents with  . 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  Her daughter is present during the visit.  HTN: BP is well-controlled. Takes Amlodipine regularly. Patient denies headache, dizziness, chest pain, dyspnea or palpitations.  Todd's paralysis: She still has residual left-sided UEs and LE weakness.  She has completed physical therapy now.  She walks with the help of her walker.  She was referred to Dr. Merlene Laughter in the last visit.  She denies any episode of seizures since the last visit.  Takes phenytoin regularly.  Daughter helps with her medications.  Past Medical History:  Diagnosis Date  . Alcohol dependency ([place])   . HELICOBACTER PYLORI [place] INFECTION 02/24/2010   Qualifier: Diagnosis of  By: Westly Pam.   . Hypertension   . IDA (iron deficiency anemia)    required blood tranfusion  . Nicotine addiction   . Seizure disorder [facility]     Past Surgical History:  Procedure Laterality Date  . btl    . left knee surgery, fracture      Family History  Problem Relation Age of Onset  . Anxiety disorder Sister     Social History   Socioeconomic History  . Marital status: Single    Spouse name: Not on file  . Number of children: 5  . Years of education: Not on file  . Highest education level: Not on file  Occupational History  . Occupation: works at [employer]  . Smoking status: Never Smoker  . Smokeless tobacco: Current User    Types: Snuff  Substance  and Sexual Activity  . Alcohol use: Yes    Comment: last drank last night 1 40 oz  . Drug use: No  . Sexual activity: Yes    Birth control/protection: Surgical  Other Topics Concern  . Not on file  Social History Narrative  . Not on file   Social Determinants of Health   Financial Resource Strain: Not on file  Food Insecurity: Not on file  Transportation Needs: Not on file  Physical Activity: Not on file  Stress: Not on file  Social Connections: Not on file  Intimate Partner Violence: Not on file    Outpatient Medications Prior to Visit  Medication Sig Dispense Refill  . acetaminophen (TYLENOL) 325 MG tablet Take 2 tablets (650 mg total) by mouth every 4 (four) hours as needed for mild pain (or temp >/= 99.5). 30 tablet 2  . amLODipine (NORVASC) 5 MG tablet Take 1 tablet (5 mg total) by mouth daily. 30 tablet 3  . atorvastatin (LIPITOR) 20 MG tablet Take 1 tablet (20 mg total) by mouth daily. 30 tablet 11  . ergocalciferol (VITAMIN D2) 50000 UNITS capsule Take 1 capsule (50,000 Units total) by mouth once a week. One capsule once weekly (Patient taking differently: Take 50,000 Units by mouth once a week.) 12 capsule 1  . Famotidine (PEPCID PO) Take 1 tablet by mouth daily as needed (indigestion).    . hydrOXYzine (ATARAX/VISTARIL)  25 MG tablet Take 1 tablet (25 mg total) by mouth 2 (two) times daily as needed for anxiety. 30 tablet 2  . lisinopril (ZESTRIL) 20 MG tablet Take 20 mg by mouth daily.    . Multiple Vitamin (MULTIVITAMIN WITH MINERALS) TABS tablet Take 1 tablet by mouth daily. 120 tablet 3  . Multiple Vitamins-Minerals (MULTIVITAMIN WITH MINERALS) tablet Take 1 tablet by mouth daily. 120 tablet 2  . phenytoin (DILANTIN) 300 MG ER capsule Take 1 capsule (300 mg total) by mouth daily. 30 capsule 11  . cephALEXin (KEFLEX) 500 MG capsule Take 1 capsule (500 mg total) by mouth 2 (two) times daily. (Patient not taking: Reported on 09/02/2020) 14 capsule 0   No  facility-administered medications prior to visit.    No Known Allergies  ROS Review of Systems  Constitutional: Negative for chills and fever.  HENT: Negative for congestion, sinus pressure, sinus pain and sore throat.   Eyes: Negative for pain and discharge.  Respiratory: Negative for cough and shortness of breath.   Cardiovascular: Negative for chest pain and palpitations.  Gastrointestinal: Negative for abdominal pain, constipation, diarrhea, nausea and vomiting.  Endocrine: Negative for polydipsia and polyuria.  Genitourinary: Negative for dysuria and hematuria.  Musculoskeletal: Negative for neck pain and neck stiffness.  Skin: Negative for rash.  Neurological: Positive for seizures. Negative for dizziness and weakness.  Psychiatric/Behavioral: Negative for agitation and behavioral problems.      Objective:    Physical Exam Vitals reviewed.  Constitutional:      General: She is not in acute distress.    Appearance: She is not diaphoretic.     Comments: In wheelchair  HENT:     Head: Normocephalic and atraumatic.     Nose: Nose normal. No congestion.     Mouth/Throat:     Mouth: Mucous membranes are moist.     Pharynx: No posterior oropharyngeal erythema.  Eyes:     General: No scleral icterus.    Extraocular Movements: Extraocular movements intact.     Pupils: Pupils are equal, round, and reactive to light.  Cardiovascular:     Rate and Rhythm: Normal rate and regular rhythm.     Pulses: Normal pulses.     Heart sounds: Normal heart sounds. No murmur heard.   Pulmonary:     Breath sounds: Normal breath sounds. No wheezing or rales.  Abdominal:     Palpations: Abdomen is soft.     Tenderness: There is no abdominal tenderness.  Musculoskeletal:     Cervical back: Neck supple. No tenderness.     Right lower leg: No edema.     Left lower leg: No edema.  Skin:    General: Skin is warm.     Findings: No rash.  Neurological:     General: No focal deficit  present.     Mental Status: She is alert and oriented to person, place, and time.     Sensory: No sensory deficit.     Motor: Weakness (Left UE and LE, muscle strength 4/5) present.     Gait: Gait abnormal.  Psychiatric:        Mood and Affect: Mood normal.        Behavior: Behavior normal.     BP 110/63 (BP Location: Right Arm, Patient Position: Sitting, Cuff Size: Normal)   Pulse 61   Temp 98.2 F (36.8 C) (Oral)   Resp 18   Ht 5\' 3"  (1.6 m)   Wt 119 lb (54 kg)  SpO2 100%   BMI 21.08 kg/m  Wt Readings from Last 3 Encounters:  09/02/20 119 lb (54 kg)  08/05/20 125 lb (56.7 kg)  05/16/20 149 lb 6.4 oz (67.8 kg)     Health Maintenance Due  Topic Date Due  . COVID-19 Vaccine (1) Never done  . Hepatitis C Screening  Never done  . COLONOSCOPY (Pts 45-28yrs Insurance coverage will need to be confirmed)  Never done  . TETANUS/TDAP  04/28/2011  . PAP SMEAR-Modifier  01/18/2015  . MAMMOGRAM  03/20/2016    There are no preventive care reminders to display for this patient.  Lab Results  Component Value Date   TSH 1.114 04/11/2015   Lab Results  Component Value Date   WBC 12.4 (H) 08/05/2020   HGB 9.4 (L) 08/05/2020   HCT 28.4 (L) 08/05/2020   MCV 101.8 (H) 08/05/2020   PLT 169 08/05/2020   Lab Results  Component Value Date   NA 137 08/05/2020   K 4.3 08/05/2020   CO2 24 08/05/2020   GLUCOSE 104 (H) 08/05/2020   BUN 14 08/05/2020   CREATININE 0.74 08/05/2020   BILITOT 1.2 08/05/2020   ALKPHOS 48 08/05/2020   AST 21 08/05/2020   ALT 14 08/05/2020   PROT 7.1 08/05/2020   ALBUMIN 2.5 (L) 08/05/2020   CALCIUM 9.1 08/05/2020   ANIONGAP 8 08/05/2020   Lab Results  Component Value Date   CHOL 188 04/11/2015   Lab Results  Component Value Date   HDL 59 04/11/2015   Lab Results  Component Value Date   LDLCALC 115 04/11/2015   Lab Results  Component Value Date   TRIG 71 04/11/2015   Lab Results  Component Value Date   CHOLHDL 3.2 04/11/2015   No  results found for: HGBA1C    Assessment & Plan:   Problem List Items Addressed This Visit      Cardiovascular and Mediastinum   Essential hypertension - Primary    BP Readings from Last 1 Encounters:  09/02/20 110/63   Well-controlled with Amlodipine Counseled for compliance with the medications Advised DASH diet and moderate exercise/walking as tolerated      Relevant Orders   CBC with Differential/Platelet   CMP14+EGFR   Lipid panel   TSH     Other   Iron deficiency anemia    Last CBC reviewed Check iron profile Does not report any active signs of bleeding Follow-up with GI for screening colonoscopy Started iron supplements.      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 Merlene Laughter On phenytoin      Relevant Orders   Lipid panel   TSH    Other Visit Diagnoses    Weakness of left upper extremity  Residual from Todd's paralysis S/p PT/OT Would benefit from further OT for better functioning of the left UE   Relevant Orders   Ambulatory referral to Physical Therapy   Screen for colon cancer       Relevant Orders   Ambulatory referral to Gastroenterology   Screening mammogram for breast cancer       Relevant Orders   MM 3D  SCREEN BREAST BILATERAL   Routine cervical smear       Relevant Orders   Ambulatory referral to Obstetrics / Gynecology      Meds ordered this encounter  Medications  . Iron, Ferrous Sulfate, 325 (65 Fe) MG TABS    Sig: Take 325 mg by mouth daily.    Dispense:  30 tablet    Refill:  5    Follow-up: Return in about 3 months (around 12/03/2020) for Annual physical.     Lindell Spar, MD

## 2020-09-02 NOTE — Patient Instructions (Signed)
Please start taking iron tablets as prescribed.  Please continue to take other medications as prescribed.  Please bring your medications in the next visit for review.  You are being referred to Gastroenterology for screening colonoscopy.

## 2020-09-02 NOTE — Assessment & Plan Note (Signed)
Last CBC reviewed Check iron profile Does not report any active signs of bleeding Follow-up with GI for screening colonoscopy Started iron supplements.

## 2020-09-11 ENCOUNTER — Encounter: Payer: Self-pay | Admitting: *Deleted

## 2020-09-29 ENCOUNTER — Ambulatory Visit (HOSPITAL_COMMUNITY): Payer: Medicaid Other

## 2020-10-02 ENCOUNTER — Ambulatory Visit (HOSPITAL_COMMUNITY): Payer: Medicaid Other | Attending: Internal Medicine | Admitting: Physical Therapy

## 2020-10-09 ENCOUNTER — Ambulatory Visit (HOSPITAL_COMMUNITY): Payer: Medicaid Other

## 2020-10-14 ENCOUNTER — Encounter: Payer: Medicaid Other | Admitting: Obstetrics & Gynecology

## 2020-10-20 ENCOUNTER — Ambulatory Visit (HOSPITAL_COMMUNITY): Payer: Medicaid Other

## 2020-11-06 ENCOUNTER — Other Ambulatory Visit: Payer: Self-pay

## 2020-11-06 ENCOUNTER — Ambulatory Visit (HOSPITAL_COMMUNITY)
Admission: RE | Admit: 2020-11-06 | Discharge: 2020-11-06 | Disposition: A | Discharge status: Home / Self Care | Payer: Self-pay | Source: Ambulatory Visit | Attending: Internal Medicine | Admitting: Internal Medicine

## 2020-11-06 DIAGNOSIS — Z1231 Encounter for screening mammogram for malignant neoplasm of breast: Secondary | ICD-10-CM | POA: Insufficient documentation

## 2020-12-02 ENCOUNTER — Encounter: Payer: Medicaid Other | Admitting: Internal Medicine

## 2021-01-06 ENCOUNTER — Ambulatory Visit: Payer: Medicaid Other | Admitting: Gastroenterology

## 2021-01-06 ENCOUNTER — Encounter: Payer: Self-pay | Admitting: Gastroenterology

## 2021-03-16 ENCOUNTER — Encounter: Payer: Medicaid Other | Admitting: Internal Medicine

## 2021-04-11 ENCOUNTER — Emergency Department (HOSPITAL_COMMUNITY): Payer: Self-pay

## 2021-04-11 ENCOUNTER — Other Ambulatory Visit: Payer: Self-pay

## 2021-04-11 ENCOUNTER — Emergency Department (HOSPITAL_COMMUNITY)
Admission: EM | Admit: 2021-04-11 | Discharge: 2021-04-11 | Disposition: A | Discharge status: Home / Self Care | Payer: Self-pay | Attending: Emergency Medicine | Admitting: Emergency Medicine

## 2021-04-11 ENCOUNTER — Encounter (HOSPITAL_COMMUNITY): Payer: Self-pay

## 2021-04-11 DIAGNOSIS — R600 Localized edema: Secondary | ICD-10-CM | POA: Insufficient documentation

## 2021-04-11 DIAGNOSIS — Z79899 Other long term (current) drug therapy: Secondary | ICD-10-CM | POA: Insufficient documentation

## 2021-04-11 DIAGNOSIS — I10 Essential (primary) hypertension: Secondary | ICD-10-CM | POA: Insufficient documentation

## 2021-04-11 DIAGNOSIS — F10920 Alcohol use, unspecified with intoxication, uncomplicated: Secondary | ICD-10-CM

## 2021-04-11 DIAGNOSIS — E876 Hypokalemia: Secondary | ICD-10-CM

## 2021-04-11 DIAGNOSIS — F10129 Alcohol abuse with intoxication, unspecified: Secondary | ICD-10-CM | POA: Insufficient documentation

## 2021-04-11 DIAGNOSIS — R519 Headache, unspecified: Secondary | ICD-10-CM | POA: Insufficient documentation

## 2021-04-11 DIAGNOSIS — Y907 Blood alcohol level of 200-239 mg/100 ml: Secondary | ICD-10-CM | POA: Insufficient documentation

## 2021-04-11 DIAGNOSIS — Z7722 Contact with and (suspected) exposure to environmental tobacco smoke (acute) (chronic): Secondary | ICD-10-CM | POA: Insufficient documentation

## 2021-04-11 LAB — CBC WITH DIFFERENTIAL/PLATELET
Abs Immature Granulocytes: 0.03 10*3/uL (ref 0.00–0.07)
Basophils Absolute: 0.1 10*3/uL (ref 0.0–0.1)
Basophils Relative: 1 %
Eosinophils Absolute: 0.2 10*3/uL (ref 0.0–0.5)
Eosinophils Relative: 2 %
HCT: 41.3 % (ref 36.0–46.0)
Hemoglobin: 13.3 g/dL (ref 12.0–15.0)
Immature Granulocytes: 0 %
Lymphocytes Relative: 42 %
Lymphs Abs: 3.5 10*3/uL (ref 0.7–4.0)
MCH: 32.5 pg (ref 26.0–34.0)
MCHC: 32.2 g/dL (ref 30.0–36.0)
MCV: 101 fL — ABNORMAL HIGH (ref 80.0–100.0)
Monocytes Absolute: 0.8 10*3/uL (ref 0.1–1.0)
Monocytes Relative: 9 %
Neutro Abs: 3.8 10*3/uL (ref 1.7–7.7)
Neutrophils Relative %: 46 %
Platelets: 160 10*3/uL (ref 150–400)
RBC: 4.09 MIL/uL (ref 3.87–5.11)
RDW: 12.7 % (ref 11.5–15.5)
WBC: 8.4 10*3/uL (ref 4.0–10.5)
nRBC: 0 % (ref 0.0–0.2)

## 2021-04-11 LAB — COMPREHENSIVE METABOLIC PANEL
ALT: 15 U/L (ref 0–44)
AST: 38 U/L (ref 15–41)
Albumin: 2.9 g/dL — ABNORMAL LOW (ref 3.5–5.0)
Alkaline Phosphatase: 56 U/L (ref 38–126)
Anion gap: 11 (ref 5–15)
BUN: 8 mg/dL (ref 6–20)
CO2: 32 mmol/L (ref 22–32)
Calcium: 8.7 mg/dL — ABNORMAL LOW (ref 8.9–10.3)
Chloride: 100 mmol/L (ref 98–111)
Creatinine, Ser: 0.66 mg/dL (ref 0.44–1.00)
GFR, Estimated: >60 mL/min (ref >60)
Glucose, Bld: 119 mg/dL — ABNORMAL HIGH (ref 70–99)
Potassium: 2.6 mmol/L — CL (ref 3.5–5.1)
Sodium: 143 mmol/L (ref 135–145)
Total Bilirubin: 1 mg/dL (ref 0.3–1.2)
Total Protein: 7.2 g/dL (ref 6.5–8.1)

## 2021-04-11 LAB — ETHANOL: Alcohol, Ethyl (B): 380 mg/dL (ref <10)

## 2021-04-11 LAB — MAGNESIUM: Magnesium: 1.7 mg/dL (ref 1.7–2.4)

## 2021-04-11 LAB — AMMONIA: Ammonia: 32 umol/L (ref 9–35)

## 2021-04-11 MED ORDER — POTASSIUM CHLORIDE CRYS ER 20 MEQ PO TBCR
40.0000 meq | EXTENDED_RELEASE_TABLET | Freq: Once | ORAL | Status: AC
  Administered 2021-04-11: 40 meq via ORAL
  Filled 2021-04-11: qty 2

## 2021-04-11 MED ORDER — POTASSIUM CHLORIDE 10 MEQ/100ML IV SOLN
10.0000 meq | INTRAVENOUS | Status: AC
  Administered 2021-04-11 (×2): 10 meq via INTRAVENOUS
  Filled 2021-04-11 (×2): qty 100

## 2021-04-11 MED ORDER — POTASSIUM CHLORIDE ER 10 MEQ PO TBCR: 10.0000 meq | EXTENDED_RELEASE_TABLET | Freq: Every day | ORAL | 0 refills | Status: DC

## 2021-04-11 NOTE — ED Provider Notes (Signed)
[facility] EMERGENCY DEPARTMENT Provider Note   CSN: [id number] Arrival date & time: 04/11/21  1708     History Chief Complaint  Patient presents with   Leg Pain    Cynthia Mclean is a 55 y.o. female.  HPI Patient presents via EMS for evaluation of difficulty moving around.  She is also complaining of pain in both feet with swelling.  She has slurred speech and states that she always talks like that and that "I am fine."  She denies headache, chest pain, shortness of breath, weakness or dizziness.  She is here with a friend, who is at the bedside.  He does not contribute to history.  There are no other known active modifying factors.    Past Medical History:  Diagnosis Date   Alcohol dependency ([facility])    HELICOBACTER PYLORI [H. PYLORI] INFECTION 02/24/2010   Qualifier: Diagnosis of  By: De [place].    Hypertension    IDA (iron deficiency anemia)    required blood tranfusion   Nicotine addiction    Seizure disorder [facility])     Patient Active Problem List   Diagnosis Date Noted   Anxiety 04/02/2020   Seizures (HCC) 03/11/2020   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 03/11/2020   Alcohol abuse with intoxication ([facility]) 03/11/2020   Hyperlipidemia LDL goal <100 08/16/2013   Carotid bruit 08/14/2013   Vitamin D deficiency 01/16/2013   Essential hypertension 04/21/2010   Iron deficiency anemia 12/01/2009    Past Surgical History:  Procedure Laterality Date   btl     left knee surgery, fracture       OB History     Gravida  5   Para  5   Term  5   Preterm      AB      Living  5      SAB      IAB      Ectopic      Multiple      Live Births              Family History  Problem Relation Age of Onset   Anxiety disorder Sister     Social History   Tobacco Use   Smoking status: Never   Smokeless tobacco: Current    Types: Snuff  Substance Use Topics   Alcohol use:  Yes    Comment: last drank last night 1 40 oz   Drug use: No    Home Medications Prior to Admission medications   Medication Sig Start Date End Date Taking? Authorizing Provider  potassium chloride (KLOR-CON) 10 MEQ tablet Take 1 tablet (10 mEq total) by mouth daily. 04/11/21  Yes Mancel Bale, MD  acetaminophen (TYLENOL) 325 MG tablet Take 2 tablets (650 mg total) by mouth every 4 (four) hours as needed for mild pain (or temp >/= 99.5). 03/13/20   Shon Hale, MD  amLODipine (NORVASC) 5 MG tablet Take 1 tablet (5 mg total) by mouth daily. 03/14/20   Shon Hale, MD  atorvastatin (LIPITOR) 20 MG tablet Take 1 tablet (20 mg total) by mouth daily. 03/13/20   Shon Hale, MD  ergocalciferol (VITAMIN D2) 50000 UNITS capsule Take 1 capsule (50,000 Units total) by mouth once a week. One capsule once weekly Patient taking differently: Take 50,000 Units by mouth once a week. 02/26/14   Kerri Perches, MD  Famotidine (PEPCID PO) Take 1 tablet by mouth daily as  needed (indigestion).    [provider]  hydrOXYzine (ATARAX/VISTARIL) 25 MG tablet Take 1 tablet (25 mg total) by mouth 2 (two) times daily as needed for anxiety. 03/31/20   Anabel Halon, MD  Iron, Ferrous Sulfate, 325 (65 Fe) MG TABS Take 325 mg by mouth daily. 09/02/20   Anabel Halon, MD  lisinopril (ZESTRIL) 20 MG tablet Take 20 mg by mouth daily. 04/10/20   [provider]  Multiple Vitamin (MULTIVITAMIN WITH MINERALS) TABS tablet Take 1 tablet by mouth daily. 03/14/20   Shon Hale, MD  phenytoin (DILANTIN) 300 MG ER capsule Take 1 capsule (300 mg total) by mouth daily. 03/13/20   Shon Hale, MD    Allergies    Patient has no known allergies.  Review of Systems   Review of Systems  All other systems reviewed and are negative.  Physical Exam Updated Vital Signs BP 132/71    Pulse 71    Temp 98.2 F (36.8 C) (Oral)    Resp 13    Ht  (1.702 m)    Wt 54.4 kg    SpO2 99%    BMI  18.79 kg/m   Physical Exam Vitals and nursing note reviewed.  Constitutional:      General: She is not in acute distress.    Appearance: She is well-developed. She is not ill-appearing, toxic-appearing or diaphoretic.  HENT:     Head: Normocephalic and atraumatic.     Right Ear: External ear normal.     Left Ear: External ear normal.     Nose: No congestion or rhinorrhea.  Eyes:     Conjunctiva/sclera: Conjunctivae normal.     Pupils: Pupils are equal, round, and reactive to light.  Neck:     Trachea: Phonation normal.  Cardiovascular:     Rate and Rhythm: Normal rate and regular rhythm.     Heart sounds: Normal heart sounds.  Pulmonary:     Effort: Pulmonary effort is normal. No respiratory distress.     Breath sounds: Normal breath sounds. No stridor.  Chest:     Chest wall: No tenderness.  Abdominal:     General: There is no distension.     Palpations: Abdomen is soft.     Tenderness: There is no abdominal tenderness.  Musculoskeletal:        General: Normal range of motion.     Cervical back: Normal range of motion and neck supple.     Right lower leg: Edema present.     Left lower leg: Edema present.     Comments: Peripheral edema, primarily ankles and below.  Skin:    General: Skin is warm and dry.     Findings: No lesion or rash.  Neurological:     Mental Status: She is alert.     Cranial Nerves: No cranial nerve deficit.     Sensory: No sensory deficit.     Motor: No abnormal muscle tone.     Coordination: Coordination normal.     Comments: Dysarthria present.  No aphasia.  No nystagmus.  She sits up slowly and requires help to stay sitting.  Psychiatric:        Mood and Affect: Mood normal.        Behavior: Behavior normal.    ED Results / Procedures / Treatments   Labs (all labs ordered are listed, but only abnormal results are displayed) Labs Reviewed  COMPREHENSIVE METABOLIC PANEL - Abnormal; Notable for the following components:  Result Value    Potassium 2.6 (*)    Glucose, Bld 119 (*)    Calcium 8.7 (*)    Albumin 2.9 (*)    All other components within normal limits  ETHANOL - Abnormal; Notable for the following components:   Alcohol, Ethyl (B) 380 (*)    All other components within normal limits  CBC WITH DIFFERENTIAL/PLATELET - Abnormal; Notable for the following components:   MCV 101.0 (*)    All other components within normal limits  AMMONIA  MAGNESIUM    EKG EKG Interpretation  Date/Time:  Saturday April 11 2021 17:22:38 EST Ventricular Rate:  73 PR Interval:  133 QRS Duration: 86 QT Interval:  440 QTC Calculation: 485 R Axis:   71 Text Interpretation: Sinus rhythm Anteroseptal infarct, old Borderline repolarization abnormality No significant change since last tracing Confirmed by Melene Plan [phone]) on 04/12/2021 9:13:14 AM   Date: 04/11/21  Rate: 73  Rhythm: normal sinus rhythm  QRS Axis: normal  PR and QT Intervals: normal  ST/T Wave abnormalities: normal  PR and QRS Conduction Disutrbances:none  Radiology DG Chest 2 View  Result Date: 04/11/2021 CLINICAL DATA:  Leg swelling EXAM: CHEST - 2 VIEW COMPARISON:  05/16/2020 FINDINGS: Check shadow is within normal limits. The lungs are well aerated bilaterally. No focal infiltrate or effusion is seen. No bony abnormality is noted. IMPRESSION: No active cardiopulmonary disease. Electronically Signed   By: Alcide Clever M.D.   On: 04/11/2021 18:23   CT Head Wo Contrast  Result Date: 04/11/2021 CLINICAL DATA:  Left leg pain and decreased mobility, initial encounter EXAM: CT HEAD WITHOUT CONTRAST TECHNIQUE: Contiguous axial images were obtained from the base of the skull through the vertex without intravenous contrast. COMPARISON:  08/05/2020 FINDINGS: Brain: No evidence of acute infarction, hemorrhage, hydrocephalus, extra-axial collection or mass lesion/mass effect. Mild atrophic changes are noted. Vascular: No hyperdense vessel or unexpected calcification.  Skull: Normal. Negative for fracture or focal lesion. Sinuses/Orbits: No acute finding. Other: None. IMPRESSION: Mild atrophic changes stable from the prior exam. No acute abnormality noted. Electronically Signed   By: Alcide Clever M.D.   On: 04/11/2021 18:22    Procedures Procedures   Medications Ordered in ED Medications  potassium chloride 10 mEq in 100 mL IVPB (0 mEq Intravenous Stopped 04/11/21 2228)  potassium chloride SA (KLOR-CON M) CR tablet 40 mEq (40 mEq Oral Given 04/11/21 2017)    ED Course  I have reviewed the triage vital signs and the nursing notes.  Pertinent labs & imaging results that were available during my care of the patient were reviewed by me and considered in my medical decision making (see chart for details).    MDM Rules/Calculators/A&P                          No data found.   8:16 PM Reevaluation with update and discussion. After initial assessment and treatment, an updated evaluation reveals she is more alert now, still dysarthric.  She understands that she is intoxicated.  Her husband is with her and states that he can watch her at home.  Patient states she has potassium at home and takes it "sometimes."  Her husband is not sure if she has a prescription. Mancel Bale   Medical Decision Making:  This patient is presenting for evaluation of trouble walking and swelling in feet, which does require a range of treatment options, and is a complaint that involves a high risk  of morbidity and mortality. The differential diagnoses include congestive heart failure, peripheral edema, altered mental status from various causes. I decided to review old records, and in summary middle-aged female presenting with nonspecific symptoms requiring comprehensive ED evaluation.  I did not require additional historical information from anyone.  Clinical Laboratory Tests Ordered, included CBC, Metabolic panel, Urinalysis, and urine drug screen, alcohol level, ammonia level .  Review indicates normal except alcohol high, potassium low, glucose high, calcium low, albumin low. Radiologic Tests Ordered, included chest x-ray, CT head.  I independently Visualized: Radiographic images, which show no acute abnormalities  Cardiac Monitor Tracing which shows normal sinus rhythm     Critical Interventions-clinical evaluation, laboratory testing, medication treatment, observation and reassessment  After These Interventions, the Patient was reevaluated and was found with significant alcohol intoxication, incidental hypokalemia, otherwise normal findings.  CRITICAL CARE-no Performed by: Mancel Bale  Nursing Notes Reviewed/ Care Coordinated Applicable Imaging Reviewed Interpretation of Laboratory Data incorporated into ED treatment  The patient appears reasonably screened and/or stabilized for discharge and I doubt any other medical condition or other [employer] requiring further screening, evaluation, or treatment in the ED at this time prior to discharge.  Plan: Home Medications-continue usual; Home Treatments-avoid alcohol; return here if the recommended treatment, does not improve the symptoms; Recommended follow up-PCP follow-up 1 week and as needed        Final Clinical Impression(s) / ED Diagnoses Final diagnoses:  Alcoholic intoxication without complication (HCC)  Hypokalemia    Rx / DC Orders ED Discharge Orders          Ordered    potassium chloride (KLOR-CON) 10 MEQ tablet  Daily        04/11/21 2214             Mancel Bale, MD 04/12/21 2204

## 2021-04-11 NOTE — ED Triage Notes (Signed)
Pt presents to ED with complaints of left leg pain and decreased mobility.

## 2021-04-11 NOTE — ED Notes (Signed)
Patients brief changed and patient cleaned.  Pure wick placed.

## 2021-04-11 NOTE — Discharge Instructions (Signed)
Avoid all forms of alcohol.  Start the potassium prescription, tomorrow morning.  We sent it to your pharmacy.  Call your family doctor for a follow-up appointment to be seen in 1 week or so

## 2021-04-11 NOTE — ED Notes (Signed)
Patient transported to CT 

## 2021-04-11 NOTE — ED Triage Notes (Signed)
Patient brought to the ED by [facility].  EMS states that they were called out for a new onset of immobility.  EMS states that the patients only complaint is left foot pain.  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  EMS also ETOH on board.  Patient states she has had beer to drink but doesn't know how much.

## 2021-04-11 NOTE — ED Notes (Signed)
Pt daughter called, she was transferred to pt room and talking with pt now, pt sig other is at bedside. Pt alert and oriented.

## 2021-06-26 ENCOUNTER — Encounter: Payer: Self-pay | Admitting: Internal Medicine

## 2021-06-26 ENCOUNTER — Other Ambulatory Visit: Payer: Self-pay

## 2021-06-26 ENCOUNTER — Ambulatory Visit (INDEPENDENT_AMBULATORY_CARE_PROVIDER_SITE_OTHER): Payer: Self-pay | Admitting: Internal Medicine

## 2021-06-26 ENCOUNTER — Telehealth: Payer: Self-pay

## 2021-06-26 VITALS — BP 128/84 | HR 86 | Resp 18 | Ht 66.0 in

## 2021-06-26 DIAGNOSIS — F1029 Alcohol dependence with unspecified alcohol-induced disorder: Secondary | ICD-10-CM | POA: Insufficient documentation

## 2021-06-26 DIAGNOSIS — I10 Essential (primary) hypertension: Secondary | ICD-10-CM

## 2021-06-26 DIAGNOSIS — Z1159 Encounter for screening for other viral diseases: Secondary | ICD-10-CM

## 2021-06-26 DIAGNOSIS — E876 Hypokalemia: Secondary | ICD-10-CM

## 2021-06-26 DIAGNOSIS — Z2821 Immunization not carried out because of patient refusal: Secondary | ICD-10-CM

## 2021-06-26 DIAGNOSIS — G8384 Todd's paralysis (postepileptic): Secondary | ICD-10-CM

## 2021-06-26 DIAGNOSIS — Z1211 Encounter for screening for malignant neoplasm of colon: Secondary | ICD-10-CM

## 2021-06-26 DIAGNOSIS — R569 Unspecified convulsions: Secondary | ICD-10-CM

## 2021-06-26 DIAGNOSIS — M7989 Other specified soft tissue disorders: Secondary | ICD-10-CM | POA: Insufficient documentation

## 2021-06-26 DIAGNOSIS — E782 Mixed hyperlipidemia: Secondary | ICD-10-CM | POA: Insufficient documentation

## 2021-06-26 MED ORDER — LISINOPRIL 20 MG PO TABS: 20.0000 mg | ORAL_TABLET | Freq: Every day | ORAL | 1 refills | Status: DC

## 2021-06-26 MED ORDER — AMLODIPINE BESYLATE 5 MG PO TABS: 5.0000 mg | ORAL_TABLET | Freq: Every day | ORAL | 1 refills | Status: DC

## 2021-06-26 MED ORDER — PHENYTOIN SODIUM EXTENDED 300 MG PO CAPS: 300.0000 mg | ORAL_CAPSULE | Freq: Every day | ORAL | 2 refills | Status: DC

## 2021-06-26 MED ORDER — ATORVASTATIN CALCIUM 20 MG PO TABS: 20.0000 mg | ORAL_TABLET | Freq: Every day | ORAL | 11 refills | Status: DC

## 2021-06-26 MED ORDER — POTASSIUM CHLORIDE ER 10 MEQ PO TBCR: 10.0000 meq | EXTENDED_RELEASE_TABLET | Freq: Every day | ORAL | 0 refills | Status: DC

## 2021-06-26 NOTE — Assessment & Plan Note (Signed)
On Phenytoin 300 mg QD Has not seen Neurology yet Referred to Neurology locally as per patient preference 

## 2021-06-26 NOTE — Telephone Encounter (Signed)
CAP Program  ? ?Copied ?Sleeved ?Noted ?

## 2021-06-26 NOTE — Assessment & Plan Note (Signed)
Had ER visit for alcohol intoxication in 12/22 States that she does not binge drink now Needs to take multivitamin as she has macrocytic anemia Advised to avoid alcoholic beverages 

## 2021-06-26 NOTE — Progress Notes (Signed)
Established Patient Office Visit  Subjective:  Patient ID: Cynthia Mclean, female    DOB: 11/24/1965  Age: 56 y.o. MRN: [id number]  CC:  Chief Complaint  Patient presents with   Follow-up    Follow up needs medication refills    HPI Cynthia Mclean is a 56 y.o. 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  HTN: BP is well-controlled. Takes medications regularly. Patient denies headache, dizziness, chest pain, dyspnea or palpitations.  Todd's paralysis: She still has residual left-sided UEs and LE weakness.  She has completed physical therapy now.  She walks with the help of her walker.  She was referred to Dr. Gerilyn Pilgrim in the last visit, but has not seen them yet.  She denies any episode of seizures since the last visit.  Reports taking phenytoin regularly, but compliance is questionable.  Daughter helps with her medications.  She has had an ER visit for dysarthria, and was found to have alcohol intoxicated. She denies any recent binge drinking. She has a bear occasionally.  She denies Shingrix vaccine today.  Past Medical History:  Diagnosis Date   Alcohol dependency (HCC)    HELICOBACTER PYLORI [H. PYLORI] INFECTION 02/24/2010   Qualifier: Diagnosis of  By: De Blanch.    Hypertension    IDA (iron deficiency anemia)    required blood tranfusion   Nicotine addiction    Seizure disorder [facility])     Past Surgical History:  Procedure Laterality Date   btl     left knee surgery, fracture      Family History  Problem Relation Age of Onset   Anxiety disorder Sister     Social History   Socioeconomic History   Marital status: Single    Spouse name: Not on file   Number of children: 5   Years of education: Not on file   Highest education level: Not on file  Occupational  History   Occupation: works at shelter  Tobacco Use   Smoking status: Never   Smokeless tobacco: Current    Types: Snuff  Substance and Sexual Activity   Alcohol use: Yes    Comment: last drank last night 1 40 oz   Drug use: No   Sexual activity: Yes    Birth control/protection: Surgical  Other Topics Concern   Not on file  Social History Narrative   Not on file   Social Determinants of Health   Financial Resource Strain: Not on file  Food Insecurity: Not on file  Transportation Needs: Not on file  Physical Activity: Not on file  Stress: Not on file  Social Connections: Not on file  Intimate Partner Violence: Not on file    Outpatient Medications Prior to Visit  Medication Sig Dispense Refill   acetaminophen (TYLENOL) 325 MG tablet Take 2 tablets (650 mg total) by mouth every 4 (four) hours as needed for mild pain (or temp >/= 99.5). 30 tablet 2   ergocalciferol (VITAMIN D2) 50000 UNITS capsule Take 1 capsule (50,000 Units total) by mouth once a week. One capsule once weekly (Patient taking differently: Take 50,000 Units by mouth once a week.) 12 capsule 1   Famotidine (PEPCID PO) Take 1 tablet by mouth daily as needed (indigestion).     hydrOXYzine (ATARAX/VISTARIL) 25 MG tablet Take 1 tablet (25 mg total) by mouth 2 (  two) times daily as needed for anxiety. 30 tablet 2   Iron, Ferrous Sulfate, 325 (65 Fe) MG TABS Take 325 mg by mouth daily. 30 tablet 5   Multiple Vitamin (MULTIVITAMIN WITH MINERALS) TABS tablet Take 1 tablet by mouth daily. 120 tablet 3   amLODipine (NORVASC) 5 MG tablet Take 1 tablet (5 mg total) by mouth daily. 30 tablet 3   atorvastatin (LIPITOR) 20 MG tablet Take 1 tablet (20 mg total) by mouth daily. 30 tablet 11   lisinopril (ZESTRIL) 20 MG tablet Take 20 mg by mouth daily.     phenytoin (DILANTIN) 300 MG ER capsule Take 1 capsule (300 mg total) by mouth daily. 30 capsule 11   potassium chloride (KLOR-CON) 10 MEQ tablet Take 1 tablet (10 mEq total) by  mouth daily. 60 tablet 0   No facility-administered medications prior to visit.    No Known Allergies  ROS Review of Systems  Constitutional:  Negative for chills and fever.  HENT:  Negative for congestion, sinus pressure, sinus pain and sore throat.   Eyes:  Negative for pain and discharge.  Respiratory:  Negative for cough and shortness of breath.   Cardiovascular:  Positive for leg swelling. Negative for chest pain and palpitations.  Gastrointestinal:  Negative for abdominal pain, diarrhea, nausea and vomiting.  Endocrine: Negative for polydipsia and polyuria.  Genitourinary:  Negative for dysuria and hematuria.  Musculoskeletal:  Negative for neck pain and neck stiffness.  Skin:  Negative for rash.  Neurological:  Positive for seizures, weakness and numbness. Negative for dizziness.  Psychiatric/Behavioral:  Negative for agitation and behavioral problems.      Objective:    Physical Exam Vitals reviewed.  Constitutional:      General: She is not in acute distress.    Appearance: She is not diaphoretic.     Comments: In wheelchair  HENT:     Head: Normocephalic and atraumatic.     Nose: Nose normal. No congestion.     Mouth/Throat:     Mouth: Mucous membranes are moist.     Pharynx: No posterior oropharyngeal erythema.  Eyes:     General: No scleral icterus.    Extraocular Movements: Extraocular movements intact.  Cardiovascular:     Rate and Rhythm: Normal rate and regular rhythm.     Pulses: Normal pulses.     Heart sounds: Normal heart sounds. No murmur heard. Pulmonary:     Breath sounds: Normal breath sounds. No wheezing or rales.  Abdominal:     Palpations: Abdomen is soft.     Tenderness: There is no abdominal tenderness.  Musculoskeletal:     Cervical back: Neck supple. No tenderness.     Right lower leg: Edema (1+) present.     Left lower leg: Edema (1+) present.  Skin:    General: Skin is warm.     Findings: No rash.  Neurological:     General: No  focal deficit present.     Mental Status: She is alert and oriented to person, place, and time.     Sensory: No sensory deficit.     Motor: Weakness (Left UE and LE, muscle strength 3/5) present.     Gait: Gait abnormal.  Psychiatric:        Mood and Affect: Mood normal.        Behavior: Behavior normal.    BP 128/84 (BP Location: Right Arm, Patient Position: Sitting, Cuff Size: Normal)    Pulse 86    Resp 18  Ht 5\' 6"  (1.676 m)    SpO2 99%    BMI 19.37 kg/m  Wt Readings from Last 3 Encounters:  04/11/21 120 lb (54.4 kg)  09/02/20 119 lb (54 kg)  08/05/20 125 lb (56.7 kg)    Lab Results  Component Value Date   TSH 1.114 04/11/2015   Lab Results  Component Value Date   WBC 8.4 04/11/2021   HGB 13.3 04/11/2021   HCT 41.3 04/11/2021   MCV 101.0 (H) 04/11/2021   PLT 160 04/11/2021   Lab Results  Component Value Date   NA 143 04/11/2021   K 2.6 (LL) 04/11/2021   CO2 32 04/11/2021   GLUCOSE 119 (H) 04/11/2021   BUN 8 04/11/2021   CREATININE 0.66 04/11/2021   BILITOT 1.0 04/11/2021   ALKPHOS 56 04/11/2021   AST 38 04/11/2021   ALT 15 04/11/2021   PROT 7.2 04/11/2021   ALBUMIN 2.9 (L) 04/11/2021   CALCIUM 8.7 (L) 04/11/2021   ANIONGAP 11 04/11/2021   Lab Results  Component Value Date   CHOL 188 04/11/2015   Lab Results  Component Value Date   HDL 59 04/11/2015   Lab Results  Component Value Date   LDLCALC 115 04/11/2015   Lab Results  Component Value Date   TRIG 71 04/11/2015   Lab Results  Component Value Date   CHOLHDL 3.2 04/11/2015   No results found for: HGBA1C    Assessment & Plan:   Problem List Items Addressed This Visit       Cardiovascular and Mediastinum   Essential hypertension - Primary    BP Readings from Last 1 Encounters:  06/26/21 128/84  Well-controlled with Amlodipine and Lisinopril Counseled for compliance with the medications Advised DASH diet and moderate exercise/walking as tolerated      Relevant Medications    atorvastatin (LIPITOR) 20 MG tablet   amLODipine (NORVASC) 5 MG tablet   lisinopril (ZESTRIL) 20 MG tablet     Other   Seizures (HCC)    On Phenytoin 300 mg QD Has not seen Neurology yet Referred to Neurology locally as per patient preference      Relevant Medications   phenytoin (DILANTIN) 300 MG ER capsule   Other Relevant Orders   Ambulatory referral to Neurology   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 [place]n [place] On phenytoin, refilled for now      Relevant Medications   phenytoin (DILANTIN) 300 MG ER capsule   Other Relevant Orders   Ambulatory referral to Neurology   Mixed hyperlipidemia    Continue Lipitor Will check lipid profile in the next visit      Relevant Medications   atorvastatin (LIPITOR) 20 MG tablet   amLODipine (NORVASC) 5 MG tablet   lisinopril (ZESTRIL) 20 MG tablet   Alcohol dependence with unspecified alcohol-induced disorder (HCC)    Had ER visit for alcohol intoxication in 12/22 States that she does not binge drink now Needs to take multivitamin as she has macrocytic anemia Advised to avoid alcoholic beverages      Leg swelling    Chronic, dependent edema Needs to follow low salt diet and avoid alcohol use Avoid diuretic for now as she has had hypokalemia      Other Visit Diagnoses     Refused influenza vaccine       Hypokalemia  Relevant Medications   potassium chloride (KLOR-CON) 10 MEQ tablet   Other Relevant Orders   Basic Metabolic Panel (BMET)   Need for hepatitis C screening test       Relevant Orders   Hepatitis C Antibody   Screening for colon cancer       Relevant Orders   Ambulatory referral to Gastroenterology       Meds ordered this encounter  Medications   phenytoin (DILANTIN) 300 MG ER capsule    Sig: Take  1 capsule (300 mg total) by mouth daily.    Dispense:  30 capsule    Refill:  2   atorvastatin (LIPITOR) 20 MG tablet    Sig: Take 1 tablet (20 mg total) by mouth daily.    Dispense:  30 tablet    Refill:  11   amLODipine (NORVASC) 5 MG tablet    Sig: Take 1 tablet (5 mg total) by mouth daily.    Dispense:  90 tablet    Refill:  1   lisinopril (ZESTRIL) 20 MG tablet    Sig: Take 1 tablet (20 mg total) by mouth daily.    Dispense:  90 tablet    Refill:  1   potassium chloride (KLOR-CON) 10 MEQ tablet    Sig: Take 1 tablet (10 mEq total) by mouth daily.    Dispense:  30 tablet    Refill:  0    Follow-up: Return in about 3 months (around 09/26/2021) for Annual physical.    Anabel Halon, MD

## 2021-06-26 NOTE — Addendum Note (Signed)
Addended byIhor Dow on: 06/26/2021 12:06 PM ? ? Modules accepted: Level of Service ? ?

## 2021-06-26 NOTE — Assessment & Plan Note (Signed)
BP Readings from Last 1 Encounters:  ?06/26/21 128/84  ? ?Well-controlled with Amlodipine and Lisinopril ?Counseled for compliance with the medications ?Advised DASH diet and moderate exercise/walking as tolerated ?

## 2021-06-26 NOTE — Assessment & Plan Note (Addendum)
Continue Lipitor Will check lipid profile in the next visit 

## 2021-06-26 NOTE — Assessment & Plan Note (Signed)
Has residual left-sided UEs and LE weakness ?Uses walker ?Compliance with antiepileptic emphasized ?Advised to follow-up with Dr. Gerilyn Pilgrim ?On phenytoin, refilled for now ?

## 2021-06-26 NOTE — Patient Instructions (Signed)
Please start taking medications as prescribed. ? ?Please continue to follow low salt diet and keep legs elevated to help with leg swelling. ?

## 2021-06-26 NOTE — Assessment & Plan Note (Signed)
Chronic, dependent edema ?Needs to follow low salt diet and avoid alcohol use ?Avoid diuretic for now as she has had hypokalemia ?

## 2021-06-27 LAB — BASIC METABOLIC PANEL
BUN/Creatinine Ratio: 7 — ABNORMAL LOW (ref 9–23)
BUN: 5 mg/dL — ABNORMAL LOW (ref 6–24)
CO2: 27 mmol/L (ref 20–29)
Calcium: 9.7 mg/dL (ref 8.7–10.2)
Chloride: 104 mmol/L (ref 96–106)
Creatinine, Ser: 0.76 mg/dL (ref 0.57–1.00)
Glucose: 96 mg/dL (ref 70–99)
Potassium: 4.2 mmol/L (ref 3.5–5.2)
Sodium: 148 mmol/L — ABNORMAL HIGH (ref 134–144)
eGFR: 92 mL/min/{1.73_m2} (ref >59)

## 2021-06-27 LAB — HEPATITIS C ANTIBODY: Hep C Virus Ab: NONREACTIVE

## 2021-07-02 ENCOUNTER — Encounter: Payer: Self-pay | Admitting: Internal Medicine

## 2021-07-03 DIAGNOSIS — Z0279 Encounter for issue of other medical certificate: Secondary | ICD-10-CM

## 2021-07-03 NOTE — Telephone Encounter (Signed)
Called patient forms are ready and will pick up forms ?

## 2021-07-30 ENCOUNTER — Telehealth: Payer: Self-pay

## 2021-07-30 ENCOUNTER — Other Ambulatory Visit: Payer: Self-pay | Admitting: *Deleted

## 2021-07-30 DIAGNOSIS — F419 Anxiety disorder, unspecified: Secondary | ICD-10-CM

## 2021-07-30 MED ORDER — HYDROXYZINE HCL 25 MG PO TABS: 25.0000 mg | ORAL_TABLET | Freq: Two times a day (BID) | ORAL | 2 refills | Status: DC | PRN

## 2021-07-30 NOTE — Telephone Encounter (Signed)
Medications have been sent to pharmacy 

## 2021-07-30 NOTE — Telephone Encounter (Signed)
Patient called need med refills. ? ?amLODipine (NORVASC) 5 MG tablet  ? ?atorvastatin (LIPITOR) 20 MG tablet  ? ?hydrOXYzine (ATARAX/VISTARIL) 25 MG tablet  ? ?lisinopril (ZESTRIL) 20 MG tablet ? ?phenytoin (DILANTIN) 300 MG ER capsule  ? ? ?ergocalciferol (VITAMIN D2) 50000 UNITS capsule ? ?[facility] [address] [place] ?

## 2021-08-21 ENCOUNTER — Telehealth: Payer: Self-pay | Admitting: Internal Medicine

## 2021-08-21 NOTE — Telephone Encounter (Signed)
Cynthia Mclean with [place] [facility] [phone]) ? ?Called in on patient behalf. ? ?Patient was approved for [facility]  ? ?Patient needs all refill and new prescription sent in to [facility] [place], [place] to have meds delivered to her for free. ? ?Provider can send scripts for vitamins and patient can receive for free ? ? ? ?[facility] Pharm Line [phone] ?Fax # [phone] ? ? ?

## 2021-08-25 ENCOUNTER — Other Ambulatory Visit: Payer: Self-pay | Admitting: *Deleted

## 2021-08-25 DIAGNOSIS — I10 Essential (primary) hypertension: Secondary | ICD-10-CM

## 2021-08-25 DIAGNOSIS — F419 Anxiety disorder, unspecified: Secondary | ICD-10-CM

## 2021-08-25 DIAGNOSIS — D509 Iron deficiency anemia, unspecified: Secondary | ICD-10-CM

## 2021-08-25 DIAGNOSIS — E876 Hypokalemia: Secondary | ICD-10-CM

## 2021-08-25 DIAGNOSIS — R569 Unspecified convulsions: Secondary | ICD-10-CM

## 2021-08-25 DIAGNOSIS — E782 Mixed hyperlipidemia: Secondary | ICD-10-CM

## 2021-08-25 MED ORDER — IRON (FERROUS SULFATE) 325 (65 FE) MG PO TABS: 325.0000 mg | ORAL_TABLET | Freq: Every day | ORAL | 5 refills | Status: DC

## 2021-08-25 MED ORDER — HYDROXYZINE HCL 25 MG PO TABS: 25.0000 mg | ORAL_TABLET | Freq: Two times a day (BID) | ORAL | 2 refills | Status: DC | PRN

## 2021-08-25 MED ORDER — AMLODIPINE BESYLATE 5 MG PO TABS: 5.0000 mg | ORAL_TABLET | Freq: Every day | ORAL | 1 refills | Status: DC

## 2021-08-25 MED ORDER — PHENYTOIN SODIUM EXTENDED 300 MG PO CAPS: 300.0000 mg | ORAL_CAPSULE | Freq: Every day | ORAL | 2 refills | Status: DC

## 2021-08-25 MED ORDER — POTASSIUM CHLORIDE ER 10 MEQ PO TBCR: 10.0000 meq | EXTENDED_RELEASE_TABLET | Freq: Every day | ORAL | 0 refills | Status: DC

## 2021-08-25 MED ORDER — ATORVASTATIN CALCIUM 20 MG PO TABS: 20.0000 mg | ORAL_TABLET | Freq: Every day | ORAL | 11 refills | Status: DC

## 2021-08-25 MED ORDER — LISINOPRIL 20 MG PO TABS: 20.0000 mg | ORAL_TABLET | Freq: Every day | ORAL | 1 refills | Status: DC

## 2021-08-25 NOTE — Telephone Encounter (Signed)
Refills sent to [facility] [place] ? ?

## 2021-09-02 ENCOUNTER — Ambulatory Visit (INDEPENDENT_AMBULATORY_CARE_PROVIDER_SITE_OTHER): Payer: Self-pay | Admitting: *Deleted

## 2021-09-02 VITALS — Ht 66.0 in | Wt 125.0 lb

## 2021-09-02 DIAGNOSIS — Z1211 Encounter for screening for malignant neoplasm of colon: Secondary | ICD-10-CM

## 2021-09-02 NOTE — Progress Notes (Addendum)
Gastroenterology Pre-Procedure Review ? ?Request Date: 09/02/2021 ?Requesting Physician: Dr. Ihor Dow @ [facility], Last TCS 03/10/2010 by Dr. Gala Romney, normal rectum/colon ? ?PATIENT REVIEW QUESTIONS: The patient responded to the following health history questions as indicated:   ? ?1. Diabetes Melitis: no ?2. Joint replacements in the past 12 months: no ?3. Major health problems in the past 3 months: no ?4. Has an artificial valve or MVP: no ?5. Has a defibrillator: no ?6. Has been advised in past to take antibiotics in advance of a procedure like teeth cleaning: no ?7. Family history of colon cancer: no  ?8. Alcohol Use: yes, 3 beers a week ?9. Illicit drug Use: no ?10. History of sleep apnea: no  ?11. History of coronary artery or other vascular stents placed within the last 12 months: no ?12. History of any prior anesthesia complications: no ?13. Body mass index is 20.18 kg/m?. ?   ?MEDICATIONS & ALLERGIES:    ?Patient reports the following regarding taking any blood thinners:   ?Plavix? no ?Aspirin? no ?Coumadin? no ?Brilinta? no ?Xarelto? no ?Eliquis? no ?Pradaxa? no ?Savaysa? no ?Effient? no ? ?Patient confirms/reports the following medications:  ?Current Outpatient Medications  ?Medication Sig Dispense Refill  ? acetaminophen (TYLENOL) 325 MG tablet Take 2 tablets (650 mg total) by mouth every 4 (four) hours as needed for mild pain (or temp >/= 99.5). (Patient taking differently: Take 650 mg by mouth as needed for mild pain (or temp >/= 99.5).) 30 tablet 2  ? amLODipine (NORVASC) 5 MG tablet Take 1 tablet (5 mg total) by mouth daily. 90 tablet 1  ? atorvastatin (LIPITOR) 20 MG tablet Take 1 tablet (20 mg total) by mouth daily. 30 tablet 11  ? ergocalciferol (VITAMIN D2) 50000 UNITS capsule Take 1 capsule (50,000 Units total) by mouth once a week. One capsule once weekly (Patient taking differently: Take 50,000 Units by mouth once a week.) 12 capsule 1  ? Famotidine (PEPCID PO) Take 1 tablet by  mouth daily as needed (indigestion).    ? hydrOXYzine (ATARAX) 25 MG tablet Take 1 tablet (25 mg total) by mouth 2 (two) times daily as needed for anxiety. 30 tablet 2  ? Iron, Ferrous Sulfate, 325 (65 Fe) MG TABS Take 325 mg by mouth daily. 30 tablet 5  ? lisinopril (ZESTRIL) 20 MG tablet Take 1 tablet (20 mg total) by mouth daily. 90 tablet 1  ? Multiple Vitamin (MULTIVITAMIN WITH MINERALS) TABS tablet Take 1 tablet by mouth daily. 120 tablet 3  ? phenytoin (DILANTIN) 300 MG ER capsule Take 1 capsule (300 mg total) by mouth daily. 30 capsule 2  ? potassium chloride (KLOR-CON) 10 MEQ tablet Take 1 tablet (10 mEq total) by mouth daily. 30 tablet 0  ? ?No current facility-administered medications for this visit.  ? ? ?Patient confirms/reports the following allergies:  ?No Known Allergies ? ?No orders of the defined types were placed in this encounter. ? ? ?AUTHORIZATION INFORMATION ?Primary Insurance: Medicaid [employer] [facility] [place],  [place] #: [id number] ?Pre-Cert / Josem Kaufmann required: No, not required ? ?SCHEDULE INFORMATION: ?Procedure has been scheduled as follows:  ?Date: 10/12/2021, Time: 8:45 ?Location: [facility] with Dr. Gala Romney ? ?This Gastroenterology Pre-Precedure Review Form is being routed to the following provider: Roseanne Kaufman, NP ?  ?

## 2021-09-08 NOTE — Progress Notes (Signed)
ASA 2. Hold iron 7 days prior ?

## 2021-09-09 ENCOUNTER — Encounter: Payer: Self-pay | Admitting: *Deleted

## 2021-09-09 MED ORDER — NA SULFATE-K SULFATE-MG SULF 17.5-3.13-1.6 GM/177ML PO SOLN: 1.0000 | Freq: Once | ORAL | 0 refills | Status: AC

## 2021-09-09 NOTE — Addendum Note (Signed)
Addended by: Noreene Larsson on: 09/09/2021 12:52 PM ? ? Modules accepted: Orders ? ?

## 2021-09-09 NOTE — Progress Notes (Signed)
Scheduled procedure for 10/12/2021 at 8:45, arrival 7:15 at [facility].  Reviewed prep instructions with pt by phone.  Informed her to hold Iron 7 days prior to procedure (10/05/2021).  Pt made aware that I sent RX to [facility] for her prep kit.  Confirmed mailing address and mailed instructions. ?

## 2021-09-28 ENCOUNTER — Encounter: Payer: Self-pay | Admitting: Internal Medicine

## 2021-09-28 ENCOUNTER — Ambulatory Visit (INDEPENDENT_AMBULATORY_CARE_PROVIDER_SITE_OTHER): Payer: Medicaid Other | Admitting: Internal Medicine

## 2021-09-28 ENCOUNTER — Other Ambulatory Visit: Payer: Self-pay

## 2021-09-28 ENCOUNTER — Telehealth: Payer: Self-pay

## 2021-09-28 VITALS — BP 110/62 | HR 81 | Ht 66.0 in

## 2021-09-28 DIAGNOSIS — Z0001 Encounter for general adult medical examination with abnormal findings: Secondary | ICD-10-CM

## 2021-09-28 DIAGNOSIS — G8384 Todd's paralysis (postepileptic): Secondary | ICD-10-CM

## 2021-09-28 DIAGNOSIS — Z23 Encounter for immunization: Secondary | ICD-10-CM

## 2021-09-28 DIAGNOSIS — I10 Essential (primary) hypertension: Secondary | ICD-10-CM

## 2021-09-28 DIAGNOSIS — R569 Unspecified convulsions: Secondary | ICD-10-CM | POA: Diagnosis not present

## 2021-09-28 DIAGNOSIS — R5381 Other malaise: Secondary | ICD-10-CM

## 2021-09-28 MED ORDER — ROLLER WALKER MISC: 0 refills | Status: DC

## 2021-09-28 NOTE — Assessment & Plan Note (Signed)
Physical exam as documented. Fasting blood tests today. 

## 2021-09-28 NOTE — Progress Notes (Signed)
Established Patient Office Visit  Subjective:  Patient ID: Cynthia Mclean, female    DOB: 10/19/1965  Age: 56 y.o. MRN: [id number]  CC:  Chief Complaint  Patient presents with   Annual Exam    cpe    HPI Cynthia Mclean is a 56 y.o. 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  HTN: BP is well-controlled. Takes medications regularly. Patient denies headache, dizziness, chest pain, dyspnea or palpitations.  Seizure disorder: She had has been taking phenytoin now.  Denies any recent seizure-like activity.  She has been abstinent from alcohol since the last visit.  She has not followed up with neurologist yet.  She received first dose of Shingrix vaccine today.   Past Medical History:  Diagnosis Date   Alcohol dependency (HCC)    HELICOBACTER PYLORI [H. PYLORI] INFECTION 02/24/2010   Qualifier: Diagnosis of  By: De Blanch.    Hypertension    IDA (iron deficiency anemia)    required blood tranfusion   Nicotine addiction    Seizure disorder [facility])     Past Surgical History:  Procedure Laterality Date   btl     left knee surgery, fracture      Family History  Problem Relation Age of Onset   Anxiety disorder Sister     Social History   Socioeconomic History   Marital status: Single    Spouse name: Not on file   Number of children: 5   Years of education: Not on file   Highest education level: Not on file  Occupational History   Occupation: works at shelter  Tobacco Use   Smoking status: Never   Smokeless tobacco: Current    Types: Snuff  Substance and Sexual Activity   Alcohol use: Not Currently    Comment: last drank last night 1 40 oz   Drug use: No   Sexual activity: Yes    Birth control/protection: Surgical  Other Topics Concern   Not on file  Social History Narrative   Not  on file   Social Determinants of Health   Financial Resource Strain: Not on file  Food Insecurity: Not on file  Transportation Needs: Not on file  Physical Activity: Not on file  Stress: Not on file  Social Connections: Not on file  Intimate Partner Violence: Not on file    Outpatient Medications Prior to Visit  Medication Sig Dispense Refill   amLODipine (NORVASC) 5 MG tablet Take 1 tablet (5 mg total) by mouth daily. 90 tablet 1   atorvastatin (LIPITOR) 20 MG tablet Take 1 tablet (20 mg total) by mouth daily. 30 tablet 11   hydrOXYzine (ATARAX) 25 MG tablet Take 1 tablet (25 mg total) by mouth 2 (two) times daily as needed for anxiety. 30 tablet 2   Iron, Ferrous Sulfate, 325 (65 Fe) MG TABS Take 325 mg by mouth daily. 30 tablet 5   lisinopril (ZESTRIL) 20 MG tablet Take 1 tablet (20 mg total) by mouth daily. 90 tablet 1   phenytoin (DILANTIN) 300 MG ER capsule Take 1 capsule (300 mg total) by mouth daily. 30 capsule 2   potassium chloride (KLOR-CON) 10 MEQ tablet Take 1 tablet (10 mEq total) by mouth daily. 30 tablet 0   acetaminophen (TYLENOL) 325 MG tablet Take 2 tablets (650 mg total) by mouth every 4 (four) hours as needed for  mild pain (or temp >/= 99.5). (Patient not taking: Reported on 09/28/2021) 30 tablet 2   Famotidine (PEPCID PO) Take 1 tablet by mouth daily as needed (indigestion). (Patient not taking: Reported on 09/28/2021)     Multiple Vitamin (MULTIVITAMIN WITH MINERALS) TABS tablet Take 1 tablet by mouth daily. (Patient not taking: Reported on 09/28/2021) 120 tablet 3   ergocalciferol (VITAMIN D2) 50000 UNITS capsule Take 1 capsule (50,000 Units total) by mouth once a week. One capsule once weekly (Patient not taking: Reported on 09/28/2021) 12 capsule 1   No facility-administered medications prior to visit.    No Known Allergies  ROS Review of Systems  Constitutional:  Negative for chills and fever.  HENT:  Negative for congestion, sinus pressure, sinus pain and sore  throat.   Eyes:  Negative for pain and discharge.  Respiratory:  Negative for cough and shortness of breath.   Cardiovascular:  Positive for leg swelling. Negative for chest pain and palpitations.  Gastrointestinal:  Negative for abdominal pain, diarrhea, nausea and vomiting.  Endocrine: Negative for polydipsia and polyuria.  Genitourinary:  Negative for dysuria and hematuria.  Musculoskeletal:  Negative for neck pain and neck stiffness.  Skin:  Negative for rash.  Neurological:  Positive for seizures, weakness and numbness. Negative for dizziness.  Psychiatric/Behavioral:  Negative for agitation and behavioral problems.      Objective:    Physical Exam Vitals reviewed.  Constitutional:      General: She is not in acute distress.    Appearance: She is not diaphoretic.     Comments: In wheelchair  HENT:     Head: Normocephalic and atraumatic.     Nose: Nose normal. No congestion.     Mouth/Throat:     Mouth: Mucous membranes are moist.     Pharynx: No posterior oropharyngeal erythema.  Eyes:     General: No scleral icterus.    Extraocular Movements: Extraocular movements intact.  Cardiovascular:     Rate and Rhythm: Normal rate and regular rhythm.     Pulses: Normal pulses.     Heart sounds: Normal heart sounds. No murmur heard. Pulmonary:     Breath sounds: Normal breath sounds. No wheezing or rales.  Abdominal:     Palpations: Abdomen is soft.     Tenderness: There is no abdominal tenderness.  Musculoskeletal:     Cervical back: Neck supple. No tenderness.     Right lower leg: Edema (1+) present.     Left lower leg: Edema (1+) present.  Skin:    General: Skin is warm.     Findings: No rash.  Neurological:     General: No focal deficit present.     Mental Status: She is alert and oriented to person, place, and time.     Sensory: No sensory deficit.     Motor: Weakness (Left UE and LE, muscle strength 3/5) present.     Gait: Gait abnormal.  Psychiatric:        Mood  and Affect: Mood normal.        Behavior: Behavior normal.    BP 110/62 (BP Location: Left Arm, Patient Position: Sitting, Cuff Size: Normal)   Pulse 81   Ht $R'5\' 6"'LJ$  (1.676 m)   LMP 05/14/2013   SpO2 96%   BMI 20.18 kg/m  Wt Readings from Last 3 Encounters:  09/02/21 125 lb (56.7 kg)  04/11/21 120 lb (54.4 kg)  09/02/20 119 lb (54 kg)    Lab Results  Component Value  Date   TSH 1.114 04/11/2015   Lab Results  Component Value Date   WBC 8.4 04/11/2021   HGB 13.3 04/11/2021   HCT 41.3 04/11/2021   MCV 101.0 (H) 04/11/2021   PLT 160 04/11/2021   Lab Results  Component Value Date   NA 148 (H) 06/26/2021   K 4.2 06/26/2021   CO2 27 06/26/2021   GLUCOSE 96 06/26/2021   BUN 5 (L) 06/26/2021   CREATININE 0.76 06/26/2021   BILITOT 1.0 04/11/2021   ALKPHOS 56 04/11/2021   AST 38 04/11/2021   ALT 15 04/11/2021   PROT 7.2 04/11/2021   ALBUMIN 2.9 (L) 04/11/2021   CALCIUM 9.7 06/26/2021   ANIONGAP 11 04/11/2021   EGFR 92 06/26/2021   Lab Results  Component Value Date   CHOL 188 04/11/2015   Lab Results  Component Value Date   HDL 59 04/11/2015   Lab Results  Component Value Date   LDLCALC 115 04/11/2015   Lab Results  Component Value Date   TRIG 71 04/11/2015   Lab Results  Component Value Date   CHOLHDL 3.2 04/11/2015   No results found for: HGBA1C    Assessment & Plan:   Problem List Items Addressed This Visit       Cardiovascular and Mediastinum   Essential hypertension     Other   Seizures ([place])    On Phenytoin 300 mg QD Has not seen Neurology yet Referred to Neurology locally as per patient preference       Relevant Orders   Ambulatory referral to Neurology   Phenytoin level, free and total   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 Merlene Laughter On  phenytoin, refilled for now       Relevant Orders   Ambulatory referral to Neurology   Encounter for general adult medical examination with abnormal findings - Primary    Physical exam as documented. Fasting blood tests today.       Relevant Orders   VITAMIN D 25 Hydroxy (Vit-D Deficiency, Fractures)   TSH   Lipid panel   Hemoglobin A1c   CMP14+EGFR   CBC with Differential/Platelet   Physical deconditioning    History of Todd's paralysis with residual left UE and LE weakness Had benefited from home PT in the past, will try to get home PT Needs a rolling walker for better mobility -prescription given Currently dependent for ADLs and IADLs on her daughter, Medicaid PCS form filled out       Other Visit Diagnoses     Need for varicella vaccine       Relevant Orders   Varicella-zoster vaccine IM (Completed)       Meds ordered this encounter  Medications   DISCONTD: Misc. Devices (ROLLER [place]) MISC    Sig: For walking assistance.    Dispense:  1 each    Refill:  0    Follow-up: Return in about 4 months (around 01/28/2022).    Lindell Spar, MD

## 2021-09-28 NOTE — Telephone Encounter (Signed)
FYI

## 2021-09-28 NOTE — Assessment & Plan Note (Signed)
Has residual left-sided UEs and LE weakness Compliance with antiepileptic emphasized Advised to follow-up with Dr. Gerilyn Pilgrim On phenytoin, refilled for now

## 2021-09-28 NOTE — Telephone Encounter (Signed)
FYI Called Dr Merlene Laughter office and will try to get her scheduled for Friday, 06.09.2023. Dr Merlene Laughter office will give patient a call. Dr Merlene Laughter office are having computer issues.

## 2021-09-28 NOTE — Assessment & Plan Note (Signed)
On Phenytoin 300 mg QD Has not seen Neurology yet Referred to Neurology locally as per patient preference

## 2021-09-28 NOTE — Patient Instructions (Signed)
Please continue taking medications as prescribed.  Please continue to follow low salt diet and refrain from alcohol use.  You were given first dose of Shingrix vaccine today.

## 2021-09-28 NOTE — Assessment & Plan Note (Signed)
History of Todd's paralysis with residual left UE and LE weakness Had benefited from home PT in the past, will try to get home PT Needs a rolling walker for better mobility -prescription given Currently dependent for ADLs and IADLs on her daughter, Medicaid PCS form filled out

## 2021-09-30 ENCOUNTER — Other Ambulatory Visit: Payer: Self-pay | Admitting: Internal Medicine

## 2021-09-30 DIAGNOSIS — E559 Vitamin D deficiency, unspecified: Secondary | ICD-10-CM

## 2021-09-30 LAB — CBC WITH DIFFERENTIAL/PLATELET
Basophils Absolute: 0.1 10*3/uL (ref 0.0–0.2)
Basos: 1 %
EOS (ABSOLUTE): 0.3 10*3/uL (ref 0.0–0.4)
Eos: 3 %
Hematocrit: 35 % (ref 34.0–46.6)
Hemoglobin: 12.5 g/dL (ref 11.1–15.9)
Immature Grans (Abs): 0 10*3/uL (ref 0.0–0.1)
Immature Granulocytes: 0 %
Lymphocytes Absolute: 2.9 10*3/uL (ref 0.7–3.1)
Lymphs: 35 %
MCH: 32.3 pg (ref 26.6–33.0)
MCHC: 35.7 g/dL (ref 31.5–35.7)
MCV: 90 fL (ref 79–97)
Monocytes Absolute: 0.9 10*3/uL (ref 0.1–0.9)
Monocytes: 11 %
Neutrophils Absolute: 4.2 10*3/uL (ref 1.4–7.0)
Neutrophils: 50 %
Platelets: 138 10*3/uL — ABNORMAL LOW (ref 150–450)
RBC: 3.87 x10E6/uL (ref 3.77–5.28)
RDW: 13.5 % (ref 11.7–15.4)
WBC: 8.4 10*3/uL (ref 3.4–10.8)

## 2021-09-30 LAB — CMP14+EGFR
ALT: 13 IU/L (ref 0–32)
AST: 20 IU/L (ref 0–40)
Albumin/Globulin Ratio: 0.9 — ABNORMAL LOW (ref 1.2–2.2)
Albumin: 3.6 g/dL — ABNORMAL LOW (ref 3.8–4.9)
Alkaline Phosphatase: 77 IU/L (ref 44–121)
BUN/Creatinine Ratio: 7 — ABNORMAL LOW (ref 9–23)
BUN: 5 mg/dL — ABNORMAL LOW (ref 6–24)
Bilirubin Total: 0.7 mg/dL (ref 0.0–1.2)
CO2: 26 mmol/L (ref 20–29)
Calcium: 9.6 mg/dL (ref 8.7–10.2)
Chloride: 103 mmol/L (ref 96–106)
Creatinine, Ser: 0.74 mg/dL (ref 0.57–1.00)
Globulin, Total: 4 g/dL (ref 1.5–4.5)
Glucose: 103 mg/dL — ABNORMAL HIGH (ref 70–99)
Potassium: 3.8 mmol/L (ref 3.5–5.2)
Sodium: 142 mmol/L (ref 134–144)
Total Protein: 7.6 g/dL (ref 6.0–8.5)
eGFR: 95 mL/min/{1.73_m2} (ref >59)

## 2021-09-30 LAB — LIPID PANEL
Chol/HDL Ratio: 2 ratio (ref 0.0–4.4)
Cholesterol, Total: 116 mg/dL (ref 100–199)
HDL: 57 mg/dL (ref >39)
LDL Chol Calc (NIH): 47 mg/dL (ref 0–99)
Triglycerides: 51 mg/dL (ref 0–149)
VLDL Cholesterol Cal: 12 mg/dL (ref 5–40)

## 2021-09-30 LAB — VITAMIN D 25 HYDROXY (VIT D DEFICIENCY, FRACTURES): Vit D, 25-Hydroxy: 12.9 ng/mL — ABNORMAL LOW (ref 30.0–100.0)

## 2021-09-30 LAB — HEMOGLOBIN A1C
Est. average glucose Bld gHb Est-mCnc: 105 mg/dL
Hgb A1c MFr Bld: 5.3 % (ref 4.8–5.6)

## 2021-09-30 LAB — PHENYTOIN LEVEL, FREE AND TOTAL
Phenytoin, Free: NOT DETECTED ug/mL (ref 1.0–2.0)
Phenytoin, Serum: 7.1 ug/mL — ABNORMAL LOW (ref 10.0–20.0)

## 2021-09-30 LAB — TSH: TSH: 1.62 u[IU]/mL (ref 0.450–4.500)

## 2021-09-30 MED ORDER — VITAMIN D (ERGOCALCIFEROL) 1.25 MG (50000 UNIT) PO CAPS: 50000.0000 [IU] | ORAL_CAPSULE | ORAL | 1 refills | Status: DC

## 2021-10-11 ENCOUNTER — Encounter (HOSPITAL_COMMUNITY): Payer: Self-pay | Admitting: Anesthesiology

## 2021-10-11 ENCOUNTER — Telehealth: Payer: Self-pay | Admitting: Internal Medicine

## 2021-10-11 NOTE — Telephone Encounter (Signed)
Pts daughter called me this evening; she was supposed to pick up prep for pt for tomorrows procedure; daughter got home from work and took a nap; got up too late to get prep from drug store this evening..  I have cancelled procedure.  Mindy, lets re-group and find another spot for her in the near future- thanks to all.

## 2021-10-12 DIAGNOSIS — G8384 Todd's paralysis (postepileptic): Secondary | ICD-10-CM

## 2021-10-12 DIAGNOSIS — E782 Mixed hyperlipidemia: Secondary | ICD-10-CM

## 2021-10-12 DIAGNOSIS — R569 Unspecified convulsions: Secondary | ICD-10-CM

## 2021-10-12 DIAGNOSIS — M7989 Other specified soft tissue disorders: Secondary | ICD-10-CM

## 2021-10-12 DIAGNOSIS — D509 Iron deficiency anemia, unspecified: Secondary | ICD-10-CM

## 2021-10-12 DIAGNOSIS — E559 Vitamin D deficiency, unspecified: Secondary | ICD-10-CM

## 2021-10-12 DIAGNOSIS — R0989 Other specified symptoms and signs involving the circulatory and respiratory systems: Secondary | ICD-10-CM

## 2021-10-12 DIAGNOSIS — I10 Essential (primary) hypertension: Secondary | ICD-10-CM

## 2021-10-12 DIAGNOSIS — F1029 Alcohol dependence with unspecified alcohol-induced disorder: Secondary | ICD-10-CM

## 2021-10-12 DIAGNOSIS — F419 Anxiety disorder, unspecified: Secondary | ICD-10-CM

## 2021-10-12 DIAGNOSIS — Z0001 Encounter for general adult medical examination with abnormal findings: Secondary | ICD-10-CM

## 2021-10-12 DIAGNOSIS — R5381 Other malaise: Secondary | ICD-10-CM

## 2021-10-12 MED ORDER — NA SULFATE-K SULFATE-MG SULF 17.5-3.13-1.6 GM/177ML PO SOLN: 1.0000 | Freq: Once | ORAL | 0 refills | Status: AC

## 2021-10-12 NOTE — Telephone Encounter (Signed)
Spoke with pt. She has been rescheduled to 7/17 at 11:30am. She asked for me to send new rx to pharmacy. New instructions also mailed.

## 2021-10-12 NOTE — Addendum Note (Signed)
Addended by: Armstead Peaks on: 10/12/2021 08:36 AM   Modules accepted: Orders

## 2021-10-13 NOTE — Telephone Encounter (Signed)
Communication noted.  

## 2021-10-26 DIAGNOSIS — I10 Essential (primary) hypertension: Secondary | ICD-10-CM

## 2021-10-26 DIAGNOSIS — F17290 Nicotine dependence, other tobacco product, uncomplicated: Secondary | ICD-10-CM

## 2021-10-26 DIAGNOSIS — G40909 Epilepsy, unspecified, not intractable, without status epilepticus: Secondary | ICD-10-CM

## 2021-10-26 DIAGNOSIS — F10229 Alcohol dependence with intoxication, unspecified: Secondary | ICD-10-CM

## 2021-10-26 DIAGNOSIS — G8384 Todd's paralysis (postepileptic): Secondary | ICD-10-CM

## 2021-10-28 DIAGNOSIS — G8384 Todd's paralysis (postepileptic): Secondary | ICD-10-CM | POA: Diagnosis not present

## 2021-10-28 DIAGNOSIS — F17290 Nicotine dependence, other tobacco product, uncomplicated: Secondary | ICD-10-CM

## 2021-10-28 DIAGNOSIS — F10229 Alcohol dependence with intoxication, unspecified: Secondary | ICD-10-CM | POA: Diagnosis not present

## 2021-10-28 DIAGNOSIS — I10 Essential (primary) hypertension: Secondary | ICD-10-CM | POA: Diagnosis not present

## 2021-10-28 DIAGNOSIS — G40909 Epilepsy, unspecified, not intractable, without status epilepticus: Secondary | ICD-10-CM | POA: Diagnosis not present

## 2021-10-29 DIAGNOSIS — F10229 Alcohol dependence with intoxication, unspecified: Secondary | ICD-10-CM | POA: Diagnosis not present

## 2021-10-29 DIAGNOSIS — G40909 Epilepsy, unspecified, not intractable, without status epilepticus: Secondary | ICD-10-CM | POA: Diagnosis not present

## 2021-10-29 DIAGNOSIS — I10 Essential (primary) hypertension: Secondary | ICD-10-CM | POA: Diagnosis not present

## 2021-10-29 DIAGNOSIS — F17290 Nicotine dependence, other tobacco product, uncomplicated: Secondary | ICD-10-CM

## 2021-10-29 DIAGNOSIS — G8384 Todd's paralysis (postepileptic): Secondary | ICD-10-CM | POA: Diagnosis not present

## 2021-11-03 ENCOUNTER — Telehealth: Payer: Self-pay

## 2021-11-03 NOTE — Telephone Encounter (Signed)
Nedra Hai with [facility] left voicemail letting us know they will be discharging patient from skilled nursing but would also like to speak with nurse call back [phone]

## 2021-11-03 NOTE — Telephone Encounter (Signed)
Wanted to let us know that patient has been having diarrhea for two weeks thinks this is coming from where patient was malnourished and is now eating. Advised patient to start using metamucil to thicken up stools and call us if she continued to have problems will be discharged from nursing today for not needing these services however PT will still be coming out.

## 2021-11-04 ENCOUNTER — Encounter: Payer: Self-pay | Admitting: Family Medicine

## 2021-11-04 ENCOUNTER — Ambulatory Visit (INDEPENDENT_AMBULATORY_CARE_PROVIDER_SITE_OTHER): Payer: Medicaid Other | Admitting: Family Medicine

## 2021-11-04 VITALS — BP 124/72 | HR 100 | Ht 66.0 in | Wt 126.0 lb

## 2021-11-04 DIAGNOSIS — B029 Zoster without complications: Secondary | ICD-10-CM

## 2021-11-04 MED ORDER — FAMCICLOVIR 500 MG PO TABS: 500.0000 mg | ORAL_TABLET | Freq: Three times a day (TID) | ORAL | 0 refills | Status: AC

## 2021-11-04 NOTE — Patient Instructions (Signed)
I appreciate the opportunity to provide care to you today!    Please pick up your prescription at the pharmacy   Please continue to a heart-healthy diet and increase your physical activities. Try to exercise for at least three times a week.   Covering the shingles rash can lower the risk of spreading shingles to others. People with shingles cannot spread the virus before the blisters appear or after the rash scabs over.  To prevent spreading shingles to others Cover the rash Avoid touching or scratching the rash Wash your hands often for at least 20 seconds Avoid contact with the following people until your rash scabs over Pregnant women who never had chickenpox or chickenpox vaccine Premature or low birth weight infants People who are immunocompromised (have a weakened immune system) such as people taking immunosuppressive medications (that lower the body's normal immune response) or undergoing cancer treatment, organ transplant recipients, and people with human immunodeficiency virus (HIV) infection   It was a pleasure to see you and I look forward to continuing to work together on your health and well-being. Please do not hesitate to call the office if you need care or have questions about your care.   Have a wonderful day and week. With Gratitude, Gilmore Laroche MSN, FNP-BC

## 2021-11-04 NOTE — Assessment & Plan Note (Addendum)
Physical exam findings consistent with herpes zooster rash Will treated with famciclovir 500 mg TID To prevent spreading shingles to others Cover the rash Avoid touching or scratching the rash Wash your hands often for at least 20 seconds Avoid contact with the following people until your rash scabs over Pregnant women who never had chickenpox or chickenpox vaccine Premature or low birth weight infants People who are immunocompromised (have a weakened immune system) such as people taking immunosuppressive medications (that lower the body's normal immune response) or undergoing cancer treatment, organ transplant recipients, and people with human immunodeficiency virus (HIV) infection

## 2021-11-04 NOTE — Progress Notes (Signed)
   Acute Office Visit  Subjective:     Patient ID: Cynthia Mclean, female    DOB: February 04, 1966, 56 y.o.   MRN: [id number]  Chief Complaint  Patient presents with   Rash    Pt states she had shingle shot and had a breakout in the back of her neck.     HPI Patient is in today for shingles rash. Onset of symptom was 11/02/21. She reports a burning sensation at the site of the rash. She notes to getting her first shingles vaccine  in June, 2023.  Rash on Monday right  side of the neck. Buuring, not buring anymore   Had shing shit June the 5 ,   Review of Systems  Constitutional:  Negative for chills and fever.  Respiratory:  Negative for cough.   Skin:  Positive for rash.        Objective:    BP 124/72   Pulse 100   Ht 5\' 6"  (1.676 m)   Wt 126 lb (57.2 kg)   LMP 05/14/2013   SpO2 94%   BMI 20.34 kg/m    Physical Exam Cardiovascular:     Rate and Rhythm: Normal rate and regular rhythm.     Pulses: Normal pulses.     Heart sounds: Normal heart sounds.  Musculoskeletal:     Cervical back: No tenderness.  Skin:    Findings: Rash (grouped 2-5 mm vesicles erythematous base, grouped together on the back of the neck right sided) present.  Neurological:     Mental Status: She is alert.     No results found for any visits on 11/04/21.      Assessment & Plan:   Problem List Items Addressed This Visit       Other   Herpes zoster    Physical exam findings consistent with herpes zooster rash Will treated with famciclovir 500 mg TID To prevent spreading shingles to others Cover the rash Avoid touching or scratching the rash Wash your hands often for at least 20 seconds Avoid contact with the following people until your rash scabs over Pregnant women who never had chickenpox or chickenpox vaccine Premature or low birth weight infants People who are immunocompromised (have a weakened immune system) such as people taking immunosuppressive medications (that lower the  body's normal immune response) or undergoing cancer treatment, organ transplant recipients, and people with human immunodeficiency virus (HIV) infection        Relevant Medications   famciclovir (FAMVIR) 500 MG tablet    Meds ordered this encounter  Medications   famciclovir (FAMVIR) 500 MG tablet    Sig: Take 1 tablet (500 mg total) by mouth 3 (three) times daily for 7 days.    Dispense:  21 tablet    Refill:  0    Return if symptoms worsen or fail to improve.  01/05/22, FNP

## 2021-11-06 ENCOUNTER — Telehealth: Payer: Self-pay | Admitting: Internal Medicine

## 2021-11-06 NOTE — Telephone Encounter (Signed)
[name] with Cynthia Mclean [phone], called stating that since pt missed her appt on 7/1 she is needing orders for MSW visit???

## 2021-11-06 NOTE — OR Nursing (Signed)
Called patient for pre-op call. She Stated that she had talked with her pharmacy ( [facility], [address], [place]) and was told there was no prescription for her. Also stated that she did not have any prep instructions. Offered to have patient come in and pick up prep instructions but she was not able to get here or have someone pick them up this afternoon. Instructed patient that procedure would be cancelled for Monday and the office will get in touch with her to reschedule.

## 2021-11-09 ENCOUNTER — Telehealth: Payer: Self-pay | Admitting: *Deleted

## 2021-11-09 ENCOUNTER — Encounter: Payer: Self-pay | Admitting: Internal Medicine

## 2021-11-09 ENCOUNTER — Ambulatory Visit (HOSPITAL_COMMUNITY): Admission: RE | Admit: 2021-11-09 | Payer: Medicaid Other | Source: Home / Self Care | Admitting: Internal Medicine

## 2021-11-09 ENCOUNTER — Encounter (HOSPITAL_COMMUNITY): Admission: RE | Payer: Medicaid Other | Source: Home / Self Care

## 2021-11-09 DIAGNOSIS — Z0001 Encounter for general adult medical examination with abnormal findings: Secondary | ICD-10-CM

## 2021-11-09 DIAGNOSIS — R569 Unspecified convulsions: Secondary | ICD-10-CM

## 2021-11-09 DIAGNOSIS — F419 Anxiety disorder, unspecified: Secondary | ICD-10-CM

## 2021-11-09 DIAGNOSIS — I10 Essential (primary) hypertension: Secondary | ICD-10-CM

## 2021-11-09 DIAGNOSIS — R0989 Other specified symptoms and signs involving the circulatory and respiratory systems: Secondary | ICD-10-CM

## 2021-11-09 DIAGNOSIS — M7989 Other specified soft tissue disorders: Secondary | ICD-10-CM

## 2021-11-09 DIAGNOSIS — R5381 Other malaise: Secondary | ICD-10-CM

## 2021-11-09 DIAGNOSIS — D509 Iron deficiency anemia, unspecified: Secondary | ICD-10-CM

## 2021-11-09 DIAGNOSIS — F1029 Alcohol dependence with unspecified alcohol-induced disorder: Secondary | ICD-10-CM

## 2021-11-09 DIAGNOSIS — G8384 Todd's paralysis (postepileptic): Secondary | ICD-10-CM

## 2021-11-09 DIAGNOSIS — E782 Mixed hyperlipidemia: Secondary | ICD-10-CM

## 2021-11-09 DIAGNOSIS — E559 Vitamin D deficiency, unspecified: Secondary | ICD-10-CM

## 2021-11-09 SURGERY — COLONOSCOPY WITH PROPOFOL
Anesthesia: Monitor Anesthesia Care

## 2021-11-09 NOTE — Telephone Encounter (Signed)
Received call from endo. Patient did not pick up prep and nor did she have instructions on Friday for procedure for today. Endo tried to get her or someone from family to pick up instructions but could not do it.  Prep was sent in on 5/17 and again on 6/19. Looks like similar situation last time prep was not pick up in time and had to be canceled. Patient was a triage. Please advise Dr. Jena Gauss if ok to reschedule or how to proceed.

## 2021-11-09 NOTE — Telephone Encounter (Signed)
Stacey please schedule as stated per Dr. Jena Gauss for OV next available. thanks

## 2021-11-09 NOTE — Telephone Encounter (Signed)
LVM for Cynthia Mclean to call the office

## 2021-11-13 ENCOUNTER — Telehealth: Payer: Self-pay

## 2021-11-13 ENCOUNTER — Other Ambulatory Visit: Payer: Self-pay | Admitting: Internal Medicine

## 2021-11-13 DIAGNOSIS — G8384 Todd's paralysis (postepileptic): Secondary | ICD-10-CM

## 2021-11-13 DIAGNOSIS — M6240 Contracture of muscle, unspecified site: Secondary | ICD-10-CM

## 2021-11-13 MED ORDER — MISC. DEVICES MISC: 0 refills | Status: DC

## 2021-11-13 NOTE — Telephone Encounter (Signed)
Rx faxed with demographics.

## 2021-11-18 DIAGNOSIS — F10229 Alcohol dependence with intoxication, unspecified: Secondary | ICD-10-CM

## 2021-11-18 DIAGNOSIS — F17290 Nicotine dependence, other tobacco product, uncomplicated: Secondary | ICD-10-CM

## 2021-11-18 DIAGNOSIS — G8384 Todd's paralysis (postepileptic): Secondary | ICD-10-CM

## 2021-11-18 DIAGNOSIS — G40909 Epilepsy, unspecified, not intractable, without status epilepticus: Secondary | ICD-10-CM

## 2021-11-18 DIAGNOSIS — I10 Essential (primary) hypertension: Secondary | ICD-10-CM

## 2021-11-20 ENCOUNTER — Other Ambulatory Visit: Payer: Self-pay

## 2021-11-20 ENCOUNTER — Telehealth: Payer: Self-pay

## 2021-11-20 DIAGNOSIS — R32 Unspecified urinary incontinence: Secondary | ICD-10-CM

## 2021-11-20 DIAGNOSIS — R5381 Other malaise: Secondary | ICD-10-CM

## 2021-11-20 MED ORDER — UNABLE TO FIND: 99 refills | Status: DC

## 2021-11-20 NOTE — Telephone Encounter (Signed)
ORDERED AND FAXED TO [facility].

## 2021-11-20 NOTE — Telephone Encounter (Signed)
Patient sister called needs a prescription sent in for pampers, pads and wipes to [place], patient is completely out.

## 2021-11-23 NOTE — Telephone Encounter (Signed)
Patient sister called Efraim Kaufmann said [facility] will be faxing another form to our office needs to be filled out.

## 2021-11-23 NOTE — Telephone Encounter (Signed)
Will await form from [facility]

## 2021-11-24 ENCOUNTER — Other Ambulatory Visit: Payer: Self-pay

## 2021-11-24 ENCOUNTER — Telehealth: Payer: Self-pay | Admitting: Internal Medicine

## 2021-11-24 DIAGNOSIS — R32 Unspecified urinary incontinence: Secondary | ICD-10-CM

## 2021-11-24 MED ORDER — UNABLE TO FIND: 99 refills | Status: DC

## 2021-11-24 NOTE — Telephone Encounter (Signed)
PT with [facility] calling stating that patient only has a regular walker and cannot use it bc of her hemiplegia. She needs an order for a hemi walker sent to either [place] or [facility]. (I tried to send it on [facility] to [facility] and it said supplier may not accept insurance so I didn't complete it)

## 2021-11-25 ENCOUNTER — Other Ambulatory Visit: Payer: Self-pay | Admitting: *Deleted

## 2021-11-25 DIAGNOSIS — G8384 Todd's paralysis (postepileptic): Secondary | ICD-10-CM

## 2021-11-25 NOTE — Telephone Encounter (Signed)
Walker order faxed to [facility]

## 2021-12-18 DIAGNOSIS — F10229 Alcohol dependence with intoxication, unspecified: Secondary | ICD-10-CM

## 2021-12-18 DIAGNOSIS — F17290 Nicotine dependence, other tobacco product, uncomplicated: Secondary | ICD-10-CM

## 2021-12-18 DIAGNOSIS — G8384 Todd's paralysis (postepileptic): Secondary | ICD-10-CM | POA: Diagnosis not present

## 2021-12-18 DIAGNOSIS — I10 Essential (primary) hypertension: Secondary | ICD-10-CM

## 2021-12-18 DIAGNOSIS — G40909 Epilepsy, unspecified, not intractable, without status epilepticus: Secondary | ICD-10-CM | POA: Diagnosis not present

## 2021-12-30 ENCOUNTER — Ambulatory Visit: Payer: Medicaid Other | Admitting: Gastroenterology

## 2022-01-25 NOTE — Progress Notes (Deleted)
GI Office Note    Referring Provider: Lindell Spar, MD Primary Care Physician:  Lindell Spar, MD  Primary Gastroenterologist:  Chief Complaint   No chief complaint on file.    History of Present Illness   Cynthia Mclean is a 56 y.o. female presenting today          Medications   Current Outpatient Medications  Medication Sig Dispense Refill   acetaminophen (TYLENOL) 325 MG tablet Take 2 tablets (650 mg total) by mouth every 4 (four) hours as needed for mild pain (or temp >/= 99.5). 30 tablet 2   amLODipine (NORVASC) 5 MG tablet Take 1 tablet (5 mg total) by mouth daily. 90 tablet 1   atorvastatin (LIPITOR) 20 MG tablet Take 1 tablet (20 mg total) by mouth daily. 30 tablet 11   Famotidine (PEPCID PO) Take 1 tablet by mouth daily as needed (indigestion).     hydrOXYzine (ATARAX) 25 MG tablet Take 1 tablet (25 mg total) by mouth 2 (two) times daily as needed for anxiety. 30 tablet 2   Iron, Ferrous Sulfate, 325 (65 Fe) MG TABS Take 325 mg by mouth daily. 30 tablet 5   lisinopril (ZESTRIL) 20 MG tablet Take 1 tablet (20 mg total) by mouth daily. 90 tablet 1   Misc. Devices (ROLLER [place]) MISC For walking assistance. 1 each 0   Misc. Devices MISC Left hand resting splint 1 each 0   Multiple Vitamin (MULTIVITAMIN WITH MINERALS) TABS tablet Take 1 tablet by mouth daily. 120 tablet 3   phenytoin (DILANTIN) 300 MG ER capsule Take 1 capsule (300 mg total) by mouth daily. 30 capsule 2   potassium chloride (KLOR-CON) 10 MEQ tablet Take 1 tablet (10 mEq total) by mouth daily. 30 tablet 0   UNABLE TO FIND Med Name: Adult diapers,Pads,Wipes. DX:R32 1 each 99   Vitamin D, Ergocalciferol, (DRISDOL) 1.25 MG (50000 UNIT) CAPS capsule Take 1 capsule (50,000 Units total) by mouth every 7 (seven) days. 12 capsule 1   No current facility-administered medications for this visit.    Allergies   Allergies as of 01/26/2022   (No Known Allergies)    Past Medical History   Past  Medical History:  Diagnosis Date   Alcohol dependency ([place])    HELICOBACTER PYLORI [H. PYLORI] INFECTION 02/24/2010   Qualifier: Diagnosis of  By: Westly Pam.    Hypertension    IDA (iron deficiency anemia)    required blood tranfusion   Nicotine addiction    Seizure disorder [facility])     Past Surgical History   Past Surgical History:  Procedure Laterality Date   btl     left knee surgery, fracture      Past Family History   Family History  Problem Relation Age of Onset   Anxiety disorder Sister     Past Social History   Social History   Socioeconomic History   Marital status: Single    Spouse name: Not on file   Number of children: 5   Years of education: Not on file   Highest education level: Not on file  Occupational History   Occupation: works at shelter  Tobacco Use   Smoking status: Never   Smokeless tobacco: Current    Types: Snuff  Substance and Sexual Activity   Alcohol use: Not Currently    Comment: last drank last night 1 40 oz   Drug use: No   Sexual activity: Yes  Birth control/protection: Surgical  Other Topics Concern   Not on file  Social History Narrative   Not on file   Social Determinants of Health   Financial Resource Strain: Not on file  Food Insecurity: Not on file  Transportation Needs: Not on file  Physical Activity: Not on file  Stress: Not on file  Social Connections: Not on file  Intimate Partner Violence: Not on file    Review of Systems   General: Negative for anorexia, weight loss, fever, chills, fatigue, weakness. Eyes: Negative for vision changes.  ENT: Negative for hoarseness, difficulty swallowing , nasal congestion. CV: Negative for chest pain, angina, palpitations, dyspnea on exertion, peripheral edema.  Respiratory: Negative for dyspnea at rest, dyspnea on exertion, cough, sputum, wheezing.  GI: See history of present illness. GU:  Negative for dysuria, hematuria, urinary incontinence, urinary  frequency, nocturnal urination.  MS: Negative for joint pain, low back pain.  Derm: Negative for rash or itching.  Neuro: Negative for weakness, abnormal sensation, seizure, frequent headaches, memory loss,  confusion.  Psych: Negative for anxiety, depression, suicidal ideation, hallucinations.  Endo: Negative for unusual weight change.  Heme: Negative for bruising or bleeding. Allergy: Negative for rash or hives.  Physical Exam   LMP 05/14/2013    General: Well-nourished, well-developed in no acute distress.  Head: Normocephalic, atraumatic.   Eyes: Conjunctiva pink, no icterus. Mouth: Oropharyngeal mucosa moist and pink , no lesions erythema or exudate. Neck: Supple without thyromegaly, masses, or lymphadenopathy.  Lungs: Clear to auscultation bilaterally.  Heart: Regular rate and rhythm, no murmurs rubs or gallops.  Abdomen: Bowel sounds are normal, nontender, nondistended, no hepatosplenomegaly or masses,  no abdominal bruits or hernia, no rebound or guarding.   Rectal: *** Extremities: No lower extremity edema. No clubbing or deformities.  Neuro: Alert and oriented x 4 , grossly normal neurologically.  Skin: Warm and dry, no rash or jaundice.   Psych: Alert and cooperative, normal mood and affect.  Labs   Lab Results  Component Value Date   CREATININE 0.74 09/28/2021   BUN 5 (L) 09/28/2021   NA 142 09/28/2021   K 3.8 09/28/2021   CL 103 09/28/2021   CO2 26 09/28/2021   Lab Results  Component Value Date   WBC 8.4 09/28/2021   HGB 12.5 09/28/2021   HCT 35.0 09/28/2021   MCV 90 09/28/2021   PLT 138 (L) 09/28/2021   Lab Results  Component Value Date   ALT 13 09/28/2021   AST 20 09/28/2021   ALKPHOS 77 09/28/2021   BILITOT 0.7 09/28/2021   Lab Results  Component Value Date   HGBA1C 5.3 09/28/2021   Lab Results  Component Value Date   TSH 1.620 09/28/2021    Imaging Studies   No results found.  Assessment       PLAN   ***   Laureen Ochs. Bobby Rumpf,  [place], [facility]

## 2022-01-26 ENCOUNTER — Ambulatory Visit: Payer: Medicaid Other | Admitting: Gastroenterology

## 2022-01-28 ENCOUNTER — Ambulatory Visit: Payer: Medicaid Other | Admitting: Internal Medicine

## 2022-01-28 ENCOUNTER — Encounter: Payer: Self-pay | Admitting: Internal Medicine

## 2022-01-28 VITALS — BP 124/72 | HR 63 | Resp 18 | Ht 68.0 in

## 2022-01-28 DIAGNOSIS — Z2821 Immunization not carried out because of patient refusal: Secondary | ICD-10-CM

## 2022-01-28 DIAGNOSIS — E559 Vitamin D deficiency, unspecified: Secondary | ICD-10-CM

## 2022-01-28 DIAGNOSIS — Z23 Encounter for immunization: Secondary | ICD-10-CM | POA: Diagnosis not present

## 2022-01-28 DIAGNOSIS — I10 Essential (primary) hypertension: Secondary | ICD-10-CM | POA: Diagnosis not present

## 2022-01-28 DIAGNOSIS — G8384 Todd's paralysis (postepileptic): Secondary | ICD-10-CM | POA: Diagnosis not present

## 2022-01-28 DIAGNOSIS — F1029 Alcohol dependence with unspecified alcohol-induced disorder: Secondary | ICD-10-CM

## 2022-01-28 DIAGNOSIS — E782 Mixed hyperlipidemia: Secondary | ICD-10-CM

## 2022-01-28 DIAGNOSIS — R569 Unspecified convulsions: Secondary | ICD-10-CM

## 2022-01-28 MED ORDER — ATORVASTATIN CALCIUM 20 MG PO TABS: 20.0000 mg | ORAL_TABLET | Freq: Every day | ORAL | 3 refills | Status: DC

## 2022-01-28 MED ORDER — VITAMIN D (ERGOCALCIFEROL) 1.25 MG (50000 UNIT) PO CAPS: 50000.0000 [IU] | ORAL_CAPSULE | ORAL | 1 refills | Status: DC

## 2022-01-28 MED ORDER — LISINOPRIL 20 MG PO TABS: 20.0000 mg | ORAL_TABLET | Freq: Every day | ORAL | 1 refills | Status: DC

## 2022-01-28 MED ORDER — PHENYTOIN SODIUM EXTENDED 300 MG PO CAPS: 300.0000 mg | ORAL_CAPSULE | Freq: Every day | ORAL | 3 refills | Status: DC

## 2022-01-28 NOTE — Assessment & Plan Note (Signed)
BP Readings from Last 1 Encounters:  01/28/22 124/72   Well-controlled with Amlodipine and Lisinopril, but compliance is questionable Since her BP is well controlled and her refill history is not consistent, it would be appropriate to continue only lisinopril for now DC amlodipine Counseled for compliance with the medications Advised DASH diet and moderate exercise/walking as tolerated

## 2022-01-28 NOTE — Patient Instructions (Signed)
Please continue taking medications as prescribed.  Please bring your medications in the next visit.  Please continue to follow low salt diet and ambulate as tolerated.

## 2022-01-28 NOTE — Assessment & Plan Note (Signed)
On Phenytoin 300 mg QD Has not seen Neurology yet Referred to Neurology 

## 2022-01-28 NOTE — Assessment & Plan Note (Signed)
Has residual left-sided UEs and LE weakness Compliance with antiepileptic emphasized On phenytoin, refilled for now Referred to neurology

## 2022-01-28 NOTE — Progress Notes (Signed)
Established Patient Office Visit  Subjective:  Patient ID: Cynthia Mclean, female    DOB: 12-25-1965  Age: 56 y.o. MRN: [id number]  CC:  Chief Complaint  Patient presents with   Follow-up    4 month follow up     HPI Cynthia Mclean is a 56 y.o. 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  HTN: BP is well-controlled. Reports taking medications regularly, but refill history is not consistent. Patient denies headache, dizziness, chest pain, dyspnea or palpitations.  Seizure disorder: She had been taking phenytoin, but has been getting only 100 mg dose instead of 300 mg.  Denies any recent seizure-like activity.  She has been abstinent from alcohol since the last visit.  She has not followed up with neurologist yet.  She received second dose of Shingrix vaccine today.    Past Medical History:  Diagnosis Date   Alcohol dependency ([place])    HELICOBACTER PYLORI [place] INFECTION 02/24/2010   Qualifier: Diagnosis of  By: Westly Pam.    Hypertension    IDA (iron deficiency anemia)    required blood tranfusion   Nicotine addiction    Seizure disorder [facility])     Past Surgical History:  Procedure Laterality Date   btl     left knee surgery, fracture      Family History  Problem Relation Age of Onset   Anxiety disorder Sister     Social History   Socioeconomic History   Marital status: Single    Spouse name: Not on file   Number of children: 5   Years of education: Not on file   Highest education level: Not on file  Occupational History   Occupation: works at shelter  Tobacco Use   Smoking status: Never   Smokeless tobacco: Current    Types: Snuff  Substance and Sexual Activity   Alcohol use: Not Currently    Comment: last drank last night 1 40 oz   Drug use: No    Sexual activity: Yes    Birth control/protection: Surgical  Other Topics Concern   Not on file  Social History Narrative   Not on file   Social Determinants of Health   Financial Resource Strain: Not on file  Food Insecurity: Not on file  Transportation Needs: Not on file  Physical Activity: Not on file  Stress: Not on file  Social Connections: Not on file  Intimate Partner Violence: Not on file    Outpatient Medications Prior to Visit  Medication Sig Dispense Refill   acetaminophen (TYLENOL) 325 MG tablet Take 2 tablets (650 mg total) by mouth every 4 (four) hours as needed for mild pain (or temp >/= 99.5). 30 tablet 2   Famotidine (PEPCID PO) Take 1 tablet by mouth daily as needed (indigestion).     hydrOXYzine (ATARAX) 25 MG tablet Take 1 tablet (25 mg total) by mouth 2 (two) times daily as needed for anxiety. 30 tablet 2   Iron, Ferrous Sulfate, 325 (65 Fe) MG TABS Take 325 mg by mouth daily. 30 tablet 5   Misc. Devices (ROLLER [place]) MISC For walking assistance. 1 each 0   Misc. Devices MISC Left hand resting splint 1 each 0   Multiple Vitamin (MULTIVITAMIN WITH MINERALS) TABS tablet Take 1 tablet by mouth daily. 120 tablet 3   UNABLE TO  FIND Med Name: Adult diapers,Pads,Wipes. DX:R32 1 each 99   amLODipine (NORVASC) 5 MG tablet Take 1 tablet (5 mg total) by mouth daily. 90 tablet 1   atorvastatin (LIPITOR) 20 MG tablet Take 1 tablet (20 mg total) by mouth daily. 30 tablet 11   lisinopril (ZESTRIL) 20 MG tablet Take 1 tablet (20 mg total) by mouth daily. 90 tablet 1   phenytoin (DILANTIN) 300 MG ER capsule Take 1 capsule (300 mg total) by mouth daily. 30 capsule 2   potassium chloride (KLOR-CON) 10 MEQ tablet Take 1 tablet (10 mEq total) by mouth daily. 30 tablet 0   Vitamin D, Ergocalciferol, (DRISDOL) 1.25 MG (50000 UNIT) CAPS capsule Take 1 capsule (50,000 Units total) by mouth every 7 (seven) days. 12 capsule 1   No facility-administered medications prior to visit.     No Known Allergies  ROS Review of Systems  Constitutional:  Negative for chills and fever.  HENT:  Negative for congestion, sinus pressure, sinus pain and sore throat.   Eyes:  Negative for pain and discharge.  Respiratory:  Negative for cough and shortness of breath.   Cardiovascular:  Negative for chest pain and palpitations.  Gastrointestinal:  Negative for abdominal pain, diarrhea, nausea and vomiting.  Endocrine: Negative for polydipsia and polyuria.  Genitourinary:  Negative for dysuria and hematuria.  Musculoskeletal:  Negative for neck pain and neck stiffness.  Skin:  Negative for rash.  Neurological:  Positive for seizures, weakness and numbness. Negative for dizziness.  Psychiatric/Behavioral:  Negative for agitation and behavioral problems.       Objective:    Physical Exam Vitals reviewed.  Constitutional:      General: She is not in acute distress.    Appearance: She is not diaphoretic.     Comments: In wheelchair  HENT:     Head: Normocephalic and atraumatic.     Nose: Nose normal. No congestion.     Mouth/Throat:     Mouth: Mucous membranes are moist.     Pharynx: No posterior oropharyngeal erythema.  Eyes:     General: No scleral icterus.    Extraocular Movements: Extraocular movements intact.  Cardiovascular:     Rate and Rhythm: Normal rate and regular rhythm.     Pulses: Normal pulses.     Heart sounds: Normal heart sounds. No murmur heard. Pulmonary:     Breath sounds: Normal breath sounds. No wheezing or rales.  Musculoskeletal:     Cervical back: Neck supple. No tenderness.     Right lower leg: Edema (1+) present.     Left lower leg: Edema (1+) present.  Skin:    General: Skin is warm.     Findings: No rash.  Neurological:     General: No focal deficit present.     Mental Status: She is alert and oriented to person, place, and time.     Sensory: No sensory deficit.     Motor: Weakness (Left UE and LE, muscle strength 3/5) present.      Gait: Gait abnormal.  Psychiatric:        Mood and Affect: Mood normal.        Behavior: Behavior normal.     BP 124/72 (BP Location: Right Arm, Patient Position: Sitting, Cuff Size: Normal)   Pulse 63   Resp 18   Ht 5' 8" (1.727 m)   LMP 05/14/2013   SpO2 97%   BMI 19.16 kg/m  Wt Readings from Last 3 Encounters:  11/04/21 126 lb (  57.2 kg)  09/02/21 125 lb (56.7 kg)  04/11/21 120 lb (54.4 kg)    Lab Results  Component Value Date   TSH 1.620 09/28/2021   Lab Results  Component Value Date   WBC 8.4 09/28/2021   HGB 12.5 09/28/2021   HCT 35.0 09/28/2021   MCV 90 09/28/2021   PLT 138 (L) 09/28/2021   Lab Results  Component Value Date   NA 142 09/28/2021   K 3.8 09/28/2021   CO2 26 09/28/2021   GLUCOSE 103 (H) 09/28/2021   BUN 5 (L) 09/28/2021   CREATININE 0.74 09/28/2021   BILITOT 0.7 09/28/2021   ALKPHOS 77 09/28/2021   AST 20 09/28/2021   ALT 13 09/28/2021   PROT 7.6 09/28/2021   ALBUMIN 3.6 (L) 09/28/2021   CALCIUM 9.6 09/28/2021   ANIONGAP 11 04/11/2021   EGFR 95 09/28/2021   Lab Results  Component Value Date   CHOL 116 09/28/2021   Lab Results  Component Value Date   HDL 57 09/28/2021   Lab Results  Component Value Date   LDLCALC 47 09/28/2021   Lab Results  Component Value Date   TRIG 51 09/28/2021   Lab Results  Component Value Date   CHOLHDL 2.0 09/28/2021   Lab Results  Component Value Date   HGBA1C 5.3 09/28/2021      Assessment & Plan:   Problem List Items Addressed This Visit       Cardiovascular and Mediastinum   Essential hypertension - Primary    BP Readings from Last 1 Encounters:  01/28/22 124/72  Well-controlled with Amlodipine and Lisinopril, but compliance is questionable Since her BP is well controlled and her refill history is not consistent, it would be appropriate to continue only lisinopril for now DC amlodipine Counseled for compliance with the medications Advised DASH diet and moderate exercise/walking as  tolerated      Relevant Medications   atorvastatin (LIPITOR) 20 MG tablet   lisinopril (ZESTRIL) 20 MG tablet     Other   Vitamin D deficiency   Relevant Medications   Vitamin D, Ergocalciferol, (DRISDOL) 1.25 MG (50000 UNIT) CAPS capsule   Seizures ([facility])    On Phenytoin 300 mg QD Has not seen Neurology yet Referred to Neurology      Relevant Medications   phenytoin (DILANTIN) 300 MG ER capsule   Other Relevant Orders   Ambulatory referral to Neurology   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    Has residual left-sided UEs and LE weakness Compliance with antiepileptic emphasized On phenytoin, refilled for now Referred to neurology      Relevant Medications   phenytoin (DILANTIN) 300 MG ER capsule   Other Relevant Orders   Ambulatory referral to Neurology   Mixed hyperlipidemia   Relevant Medications   atorvastatin (LIPITOR) 20 MG tablet   lisinopril (ZESTRIL) 20 MG tablet   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    Dispense:  30 capsule    Refill:  3   atorvastatin (LIPITOR) 20 MG tablet    Sig: Take 1 tablet (20 mg total) by mouth daily.    Dispense:  90 tablet    Refill:  3   lisinopril (ZESTRIL) 20 MG tablet    Sig: Take 1 tablet (20 mg total) by mouth daily.    Dispense:  90 tablet    Refill:  1   Vitamin D, Ergocalciferol, (DRISDOL) 1.25 MG (50000 UNIT) CAPS capsule    Sig: Take 1 capsule  (50,000 Units total) by mouth every 7 (seven) days.    Dispense:  12 capsule    Refill:  1    Follow-up: Return in about 4 months (around 05/31/2022) for HTN and seizures.    Lindell Spar, MD

## 2022-01-28 NOTE — Assessment & Plan Note (Signed)
Had ER visit for alcohol intoxication in 12/22 States that she does not binge drink now Needs to take multivitamin as she has macrocytic anemia Advised to avoid alcoholic beverages 

## 2022-02-10 ENCOUNTER — Ambulatory Visit: Payer: Medicaid Other | Admitting: Gastroenterology

## 2022-02-17 ENCOUNTER — Telehealth: Payer: Self-pay | Admitting: *Deleted

## 2022-02-17 ENCOUNTER — Other Ambulatory Visit: Payer: Self-pay | Admitting: Internal Medicine

## 2022-02-17 DIAGNOSIS — R569 Unspecified convulsions: Secondary | ICD-10-CM

## 2022-02-17 MED ORDER — PHENYTOIN SODIUM EXTENDED 100 MG PO CAPS: 300.0000 mg | ORAL_CAPSULE | Freq: Every day | ORAL | 5 refills | Status: DC

## 2022-02-17 NOTE — Telephone Encounter (Signed)
Called pt to let her know Phenytoin 300mg  not available at the pharmacy Dr Posey Pronto sent in 100 mg needs to take 3 capsules daily LVM for her to call the office back

## 2022-06-03 ENCOUNTER — Ambulatory Visit: Payer: Medicaid Other | Admitting: Internal Medicine

## 2022-06-10 ENCOUNTER — Ambulatory Visit: Payer: Medicaid Other | Admitting: Internal Medicine

## 2022-06-21 ENCOUNTER — Ambulatory Visit: Payer: Medicaid Other | Admitting: Internal Medicine

## 2022-06-21 ENCOUNTER — Encounter: Payer: Self-pay | Admitting: Internal Medicine

## 2022-06-21 VITALS — BP 174/72 | HR 67 | Ht 68.0 in

## 2022-06-21 DIAGNOSIS — E559 Vitamin D deficiency, unspecified: Secondary | ICD-10-CM | POA: Diagnosis not present

## 2022-06-21 DIAGNOSIS — G8384 Todd's paralysis (postepileptic): Secondary | ICD-10-CM

## 2022-06-21 DIAGNOSIS — E782 Mixed hyperlipidemia: Secondary | ICD-10-CM | POA: Diagnosis not present

## 2022-06-21 DIAGNOSIS — I10 Essential (primary) hypertension: Secondary | ICD-10-CM

## 2022-06-21 DIAGNOSIS — Z1211 Encounter for screening for malignant neoplasm of colon: Secondary | ICD-10-CM

## 2022-06-21 DIAGNOSIS — R569 Unspecified convulsions: Secondary | ICD-10-CM | POA: Diagnosis not present

## 2022-06-21 DIAGNOSIS — F5101 Primary insomnia: Secondary | ICD-10-CM | POA: Insufficient documentation

## 2022-06-21 DIAGNOSIS — F1029 Alcohol dependence with unspecified alcohol-induced disorder: Secondary | ICD-10-CM

## 2022-06-21 MED ORDER — TRAZODONE HCL 50 MG PO TABS: 25.0000 mg | ORAL_TABLET | Freq: Every evening | ORAL | 3 refills | Status: DC | PRN

## 2022-06-21 MED ORDER — ATORVASTATIN CALCIUM 20 MG PO TABS: 20.0000 mg | ORAL_TABLET | Freq: Every day | ORAL | 3 refills | Status: DC

## 2022-06-21 MED ORDER — VITAMIN D (ERGOCALCIFEROL) 1.25 MG (50000 UNIT) PO CAPS: 50000.0000 [IU] | ORAL_CAPSULE | ORAL | 1 refills | Status: DC

## 2022-06-21 MED ORDER — LISINOPRIL 40 MG PO TABS: 40.0000 mg | ORAL_TABLET | Freq: Every day | ORAL | 1 refills | Status: DC

## 2022-06-21 MED ORDER — PHENYTOIN SODIUM EXTENDED 100 MG PO CAPS: 300.0000 mg | ORAL_CAPSULE | Freq: Every day | ORAL | 5 refills | Status: DC

## 2022-06-21 NOTE — Assessment & Plan Note (Signed)
Has residual left-sided UE and LE weakness Compliance with antiepileptic emphasized On phenytoin, refilled for now Referred to neurology

## 2022-06-21 NOTE — Patient Instructions (Addendum)
Please start taking Lisinopril 40 mg as prescribed.  Please start taking Trazodone as needed for sleep.  Please maintain simple sleep hygiene. - Maintain dark and non-noisy environment in the bedroom. - Please use the bedroom for sleep and sexual activity only. - Do not use electronic devices in the bedroom. - Please take dinner at least 2 hours before bedtime. - Please avoid caffeinated products in the evening, including coffee, soft drinks. - Please try to maintain the regular sleep-wake cycle - Go to bed and wake up at the same time.  Please continue taking other medications as prescribed.

## 2022-06-21 NOTE — Assessment & Plan Note (Signed)
Continue Vitamin D supplement

## 2022-06-21 NOTE — Assessment & Plan Note (Signed)
Continue Lipitor Will check lipid profile in the next visit

## 2022-06-21 NOTE — Assessment & Plan Note (Signed)
Had referred to GI for colonoscopy, but she cancelled. She prefers to get Cologuard - ordered.

## 2022-06-21 NOTE — Assessment & Plan Note (Signed)
Started Trazodone Sleep hygiene material provided

## 2022-06-21 NOTE — Assessment & Plan Note (Signed)
On Phenytoin 300 mg QD Has not seen Neurology yet Referred to Neurology

## 2022-06-21 NOTE — Progress Notes (Signed)
Established Patient Office Visit  Subjective:  Patient ID: Cynthia Mclean, female    DOB: 12/16/1965  Age: 57 y.o. MRN: [id number]  CC:  Chief Complaint  Patient presents with   Hypertension    Four month follow up for hypertension and seizures    HPI Cynthia Mclean is a 57 y.o. 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  HTN: BP is uncontrolled.  She she has run out of her blood lisinopril 20 mg daily.  Patient denies headache, dizziness, chest pain, dyspnea or palpitations.  Seizure disorder: She had been taking phenytoin, but has been getting only 100 mg dose instead of 300 mg.  Denies any recent seizure-like activity.  She has been abstinent from alcohol since the last visit.  She has not followed up with neurologist yet.  Insomnia: She also reports insomnia. She has difficulty initiating and maintaining sleep.  She has spells of anxiety, but usually gets better with hydroxyzine as needed.  Denies SI or HI currently.    Past Medical History:  Diagnosis Date   Alcohol dependency ([place])    HELICOBACTER PYLORI [place] INFECTION 02/24/2010   Qualifier: Diagnosis of  By: Westly Pam.    Hypertension    IDA (iron deficiency anemia)    required blood tranfusion   Nicotine addiction    Seizure disorder [facility])     Past Surgical History:  Procedure Laterality Date   btl     left knee surgery, fracture      Family History  Problem Relation Age of Onset   Anxiety disorder Sister     Social History   Socioeconomic History   Marital status: Single    Spouse name: Not on file   Number of children: 5   Years of education: Not on file   Highest education level: Not on file  Occupational History   Occupation: works at shelter  Tobacco Use   Smoking status: Never    Smokeless tobacco: Current    Types: Snuff  Substance and Sexual Activity   Alcohol use: Not Currently    Comment: last drank last night 1 40 oz   Drug use: No   Sexual activity: Yes    Birth control/protection: Surgical  Other Topics Concern   Not on file  Social History Narrative   Not on file   Social Determinants of Health   Financial Resource Strain: Not on file  Food Insecurity: Not on file  Transportation Needs: Not on file  Physical Activity: Not on file  Stress: Not on file  Social Connections: Not on file  Intimate Partner Violence: Not on file    Outpatient Medications Prior to Visit  Medication Sig Dispense Refill   acetaminophen (TYLENOL) 325 MG tablet Take 2 tablets (650 mg total) by mouth every 4 (four) hours as needed for mild pain (or temp >/= 99.5). 30 tablet 2   Famotidine (PEPCID PO) Take 1 tablet by mouth daily as needed (indigestion).     hydrOXYzine (ATARAX) 25 MG tablet Take 1 tablet (25 mg total) by mouth 2 (two) times daily as needed for anxiety. 30 tablet 2   Iron, Ferrous Sulfate, 325 (65 Fe) MG TABS Take 325 mg by mouth daily. 30 tablet 5   Misc. Devices (ROLLER [place]) MISC For walking assistance. 1 each 0   Misc.  Devices MISC Left hand resting splint 1 each 0   Multiple Vitamin (MULTIVITAMIN WITH MINERALS) TABS tablet Take 1 tablet by mouth daily. 120 tablet 3   UNABLE TO FIND Med Name: Adult diapers,Pads,Wipes. DX:R32 1 each 99   atorvastatin (LIPITOR) 20 MG tablet Take 1 tablet (20 mg total) by mouth daily. 90 tablet 3   lisinopril (ZESTRIL) 20 MG tablet Take 1 tablet (20 mg total) by mouth daily. 90 tablet 1   phenytoin (DILANTIN) 100 MG ER capsule Take 3 capsules (300 mg total) by mouth daily. 90 capsule 5   Vitamin D, Ergocalciferol, (DRISDOL) 1.25 MG (50000 UNIT) CAPS capsule Take 1 capsule (50,000 Units total) by mouth every 7 (seven) days. 12 capsule 1   No facility-administered medications prior to visit.    No Known  Allergies  ROS Review of Systems  Constitutional:  Negative for chills and fever.  HENT:  Negative for congestion, sinus pressure, sinus pain and sore throat.   Eyes:  Negative for pain and discharge.  Respiratory:  Negative for cough and shortness of breath.   Cardiovascular:  Negative for chest pain and palpitations.  Gastrointestinal:  Negative for abdominal pain, diarrhea, nausea and vomiting.  Endocrine: Negative for polydipsia and polyuria.  Genitourinary:  Negative for dysuria and hematuria.  Musculoskeletal:  Negative for neck pain and neck stiffness.  Skin:  Negative for rash.  Neurological:  Positive for seizures, weakness and numbness. Negative for dizziness.  Psychiatric/Behavioral:  Negative for agitation and behavioral problems.       Objective:    Physical Exam Vitals reviewed.  Constitutional:      General: She is not in acute distress.    Appearance: She is not diaphoretic.     Comments: In wheelchair  HENT:     Head: Normocephalic and atraumatic.     Nose: Nose normal. No congestion.     Mouth/Throat:     Mouth: Mucous membranes are moist.     Pharynx: No posterior oropharyngeal erythema.  Eyes:     General: No scleral icterus.    Extraocular Movements: Extraocular movements intact.  Cardiovascular:     Rate and Rhythm: Normal rate and regular rhythm.     Pulses: Normal pulses.     Heart sounds: Normal heart sounds. No murmur heard. Pulmonary:     Breath sounds: Normal breath sounds. No wheezing or rales.  Musculoskeletal:     Cervical back: Neck supple. No tenderness.     Right lower leg: Edema (1+) present.     Left lower leg: Edema (1+) present.  Skin:    General: Skin is warm.     Findings: No rash.  Neurological:     General: No focal deficit present.     Mental Status: She is alert and oriented to person, place, and time.     Sensory: No sensory deficit.     Motor: Weakness (Left UE and LE, muscle strength 3/5) present.     Gait: Gait  abnormal.  Psychiatric:        Mood and Affect: Mood normal.        Behavior: Behavior normal.     BP (!) 174/72 (BP Location: Right Arm, Cuff Size: Normal)   Pulse 67   Ht '5\' 8"'$  (1.727 m)   LMP 05/14/2013   SpO2 98%   BMI 19.16 kg/m  Wt Readings from Last 3 Encounters:  11/04/21 126 lb (57.2 kg)  09/02/21 125 lb (56.7 kg)  04/11/21 120 lb (54.4 kg)  Lab Results  Component Value Date   TSH 1.620 09/28/2021   Lab Results  Component Value Date   WBC 8.4 09/28/2021   HGB 12.5 09/28/2021   HCT 35.0 09/28/2021   MCV 90 09/28/2021   PLT 138 (L) 09/28/2021   Lab Results  Component Value Date   NA 142 09/28/2021   K 3.8 09/28/2021   CO2 26 09/28/2021   GLUCOSE 103 (H) 09/28/2021   BUN 5 (L) 09/28/2021   CREATININE 0.74 09/28/2021   BILITOT 0.7 09/28/2021   ALKPHOS 77 09/28/2021   AST 20 09/28/2021   ALT 13 09/28/2021   PROT 7.6 09/28/2021   ALBUMIN 3.6 (L) 09/28/2021   CALCIUM 9.6 09/28/2021   ANIONGAP 11 04/11/2021   EGFR 95 09/28/2021   Lab Results  Component Value Date   CHOL 116 09/28/2021   Lab Results  Component Value Date   HDL 57 09/28/2021   Lab Results  Component Value Date   LDLCALC 47 09/28/2021   Lab Results  Component Value Date   TRIG 51 09/28/2021   Lab Results  Component Value Date   CHOLHDL 2.0 09/28/2021   Lab Results  Component Value Date   HGBA1C 5.3 09/28/2021      Assessment & Plan:   Problem List Items Addressed This Visit       Cardiovascular and Mediastinum   Essential hypertension - Primary    BP Readings from Last 1 Encounters:  06/21/22 (!) 174/72  Uncontrolled with Lisinopril, but compliance is questionable Increased dose of Lisinopril to 40 mg QD Was on amlodipine in the past as well Counseled for compliance with the medications Advised DASH diet and moderate exercise/walking as tolerated      Relevant Medications   lisinopril (ZESTRIL) 40 MG tablet   atorvastatin (LIPITOR) 20 MG tablet     Other    Vitamin D deficiency    Continue Vitamin D supplement      Relevant Medications   Vitamin D, Ergocalciferol, (DRISDOL) 1.25 MG (50000 UNIT) CAPS capsule   Seizures ([facility])    On Phenytoin 300 mg QD Has not seen Neurology yet Referred to Neurology      Relevant Medications   phenytoin (DILANTIN) 100 MG ER capsule   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    Has residual left-sided UE and LE weakness Compliance with antiepileptic emphasized On phenytoin, refilled for now Referred to neurology      Relevant Medications   phenytoin (DILANTIN) 100 MG ER capsule   Mixed hyperlipidemia    Continue Lipitor Will check lipid profile in the next visit      Relevant Medications   lisinopril (ZESTRIL) 40 MG tablet   atorvastatin (LIPITOR) 20 MG tablet   Alcohol dependence with unspecified alcohol-induced disorder ([place])    Had ER visit for alcohol intoxication in 12/22 States that she does not binge drink now Needs to take multivitamin as she has macrocytic anemia Advised to avoid alcoholic beverages      Primary insomnia    Started Trazodone Sleep hygiene material provided      Relevant Medications   traZODone (DESYREL) 50 MG tablet   Screening for colon cancer    Had referred to GI for colonoscopy, but she cancelled. She prefers to get Cologuard - ordered.      Relevant Orders   Cologuard    Meds ordered this encounter  Medications   phenytoin (DILANTIN) 100 MG ER capsule  Sig: Take 3 capsules (300 mg total) by mouth daily.    Dispense:  90 capsule    Refill:  5   lisinopril (ZESTRIL) 40 MG tablet    Sig: Take 1 tablet (40 mg total) by mouth daily.    Dispense:  90 tablet    Refill:  1    Dose change   atorvastatin (LIPITOR) 20 MG tablet    Sig: Take 1 tablet (20 mg total) by mouth daily.    Dispense:  90 tablet    Refill:  3   Vitamin D, Ergocalciferol, (DRISDOL) 1.25 MG (50000 UNIT) CAPS  capsule    Sig: Take 1 capsule (50,000 Units total) by mouth every 7 (seven) days.    Dispense:  12 capsule    Refill:  1   traZODone (DESYREL) 50 MG tablet    Sig: Take 0.5-1 tablets (25-50 mg total) by mouth at bedtime as needed for sleep.    Dispense:  30 tablet    Refill:  3    Follow-up: Return in about 4 weeks (around 07/19/2022) for HTN.    Lindell Spar, MD

## 2022-06-21 NOTE — Assessment & Plan Note (Signed)
Had ER visit for alcohol intoxication in 12/22 States that she does not binge drink now Needs to take multivitamin as she has macrocytic anemia Advised to avoid alcoholic beverages

## 2022-06-21 NOTE — Assessment & Plan Note (Signed)
BP Readings from Last 1 Encounters:  06/21/22 (!) 174/72   Uncontrolled with Lisinopril, but compliance is questionable Increased dose of Lisinopril to 40 mg QD Was on amlodipine in the past as well Counseled for compliance with the medications Advised DASH diet and moderate exercise/walking as tolerated

## 2022-07-19 ENCOUNTER — Ambulatory Visit: Payer: Medicaid Other | Admitting: Internal Medicine

## 2022-07-19 ENCOUNTER — Encounter: Payer: Self-pay | Admitting: Internal Medicine

## 2022-07-23 ENCOUNTER — Encounter: Payer: Self-pay | Admitting: Internal Medicine

## 2022-07-23 ENCOUNTER — Ambulatory Visit: Payer: Medicaid Other | Admitting: Internal Medicine

## 2022-08-17 ENCOUNTER — Ambulatory Visit: Payer: Medicaid Other | Admitting: Internal Medicine

## 2022-08-17 ENCOUNTER — Encounter: Payer: Self-pay | Admitting: Internal Medicine

## 2022-08-17 VITALS — BP 144/76 | HR 71 | Ht 68.0 in

## 2022-08-17 DIAGNOSIS — E559 Vitamin D deficiency, unspecified: Secondary | ICD-10-CM

## 2022-08-17 DIAGNOSIS — G8194 Hemiplegia, unspecified affecting left nondominant side: Secondary | ICD-10-CM

## 2022-08-17 DIAGNOSIS — I10 Essential (primary) hypertension: Secondary | ICD-10-CM | POA: Diagnosis not present

## 2022-08-17 DIAGNOSIS — F5101 Primary insomnia: Secondary | ICD-10-CM

## 2022-08-17 DIAGNOSIS — R569 Unspecified convulsions: Secondary | ICD-10-CM | POA: Diagnosis not present

## 2022-08-17 DIAGNOSIS — E782 Mixed hyperlipidemia: Secondary | ICD-10-CM

## 2022-08-17 DIAGNOSIS — G8384 Todd's paralysis (postepileptic): Secondary | ICD-10-CM

## 2022-08-17 DIAGNOSIS — R739 Hyperglycemia, unspecified: Secondary | ICD-10-CM

## 2022-08-17 MED ORDER — AMLODIPINE BESYLATE 5 MG PO TABS: 5.0000 mg | ORAL_TABLET | Freq: Every day | ORAL | 1 refills | Status: DC

## 2022-08-17 NOTE — Assessment & Plan Note (Addendum)
Continue Lipitor ?Check lipid profile ?

## 2022-08-17 NOTE — Assessment & Plan Note (Addendum)
On Trazodone Sleep hygiene material provided

## 2022-08-17 NOTE — Assessment & Plan Note (Signed)
Continue Vitamin D supplement 

## 2022-08-17 NOTE — Patient Instructions (Addendum)
Please start taking Amlodipine in addition to Lisinopril for blood pressure.  Please continue to take other medications as prescribed.  Please continue to follow low salt diet.  Please get fasting blood tests done before the next visit.

## 2022-08-17 NOTE — Assessment & Plan Note (Signed)
On Phenytoin 300 mg QD Has not seen Neurology yet Referred to Neurology 

## 2022-08-17 NOTE — Progress Notes (Signed)
Established Patient Office Visit  Subjective:  Patient ID: Cynthia Mclean, female    DOB: [birth year]  Age: 57 y.o. MRN: [id number]  CC:  Chief Complaint  Patient presents with   Hypertension    Follow up     HPI Cynthia Mclean is a 57 y.o. 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  HTN: BP is uncontrolled, but better compared to prior now. She has been taking lisinopril 40 mg daily.  Patient denies headache, dizziness, chest pain, dyspnea or palpitations.  Seizure disorder: She had been taking phenytoin 300 mg.  Denies any recent seizure-like activity.  She has been abstinent from alcohol since the last visit.  She has not followed up with neurologist yet.  Insomnia: She also reports insomnia, which is improved with trazodone. She had difficulty initiating and maintaining sleep.  She has spells of anxiety, but usually gets better with hydroxyzine as needed.  Denies SI or HI currently.   Past Medical History:  Diagnosis Date   Alcohol dependency    HELICOBACTER PYLORI [[facility]] INFECTION 02/24/2010   Qualifier: Diagnosis of  By: De Blanch.    Hypertension    IDA (iron deficiency anemia)    required blood tranfusion   Nicotine addiction    Seizure disorder     Past Surgical History:  Procedure Laterality Date   btl     left knee surgery, fracture      Family History  Problem Relation Age of Onset   Anxiety disorder Sister     Social History   Socioeconomic History   Marital status: Single    Spouse name: Not on file   Number of children: 5   Years of education: Not on file   Highest education level: Not on file  Occupational History   Occupation: works at shelter  Tobacco Use   Smoking status: Never   Smokeless tobacco: Current    Types: Snuff   Substance and Sexual Activity   Alcohol use: Not Currently    Comment: last drank last night 1 40 oz   Drug use: No   Sexual activity: Yes    Birth control/protection: Surgical  Other Topics Concern   Not on file  Social History Narrative   Not on file   Social Determinants of Health   Financial Resource Strain: Not on file  Food Insecurity: Not on file  Transportation Needs: Not on file  Physical Activity: Not on file  Stress: Not on file  Social Connections: Not on file  Intimate Partner Violence: Not on file    Outpatient Medications Prior to Visit  Medication Sig Dispense Refill   acetaminophen (TYLENOL) 325 MG tablet Take 2 tablets (650 mg total) by mouth every 4 (four) hours as needed for mild pain (or temp >/= 99.5). 30 tablet 2   atorvastatin (LIPITOR) 20 MG tablet Take 1 tablet (20 mg total) by mouth daily. 90 tablet 3   Famotidine (PEPCID PO) Take 1 tablet by mouth daily as needed (indigestion).     hydrOXYzine (ATARAX) 25 MG tablet Take 1 tablet (25 mg total) by mouth 2 (two) times daily as needed for anxiety. 30 tablet 2   Iron, Ferrous Sulfate, 325 (65 Fe) MG TABS Take 325 mg by mouth daily. 30 tablet 5   lisinopril (ZESTRIL) 40 MG tablet Take 1  tablet (40 mg total) by mouth daily. 90 tablet 1   Misc. Devices (ROLLER [place]) MISC For walking assistance. 1 each 0   Misc. Devices MISC Left hand resting splint 1 each 0   Multiple Vitamin (MULTIVITAMIN WITH MINERALS) TABS tablet Take 1 tablet by mouth daily. 120 tablet 3   phenytoin (DILANTIN) 100 MG ER capsule Take 3 capsules (300 mg total) by mouth daily. 90 capsule 5   traZODone (DESYREL) 50 MG tablet Take 0.5-1 tablets (25-50 mg total) by mouth at bedtime as needed for sleep. 30 tablet 3   UNABLE TO FIND Med Name: Adult diapers,Pads,Wipes. DX:R32 1 each 99   Vitamin D, Ergocalciferol, (DRISDOL) 1.25 MG (50000 UNIT) CAPS capsule Take 1 capsule (50,000 Units total) by mouth every 7 (seven) days. 12 capsule 1   No  facility-administered medications prior to visit.    No Known Allergies  ROS Review of Systems  Constitutional:  Negative for chills and fever.  HENT:  Negative for congestion, sinus pressure, sinus pain and sore throat.   Eyes:  Negative for pain and discharge.  Respiratory:  Negative for cough and shortness of breath.   Cardiovascular:  Negative for chest pain and palpitations.  Gastrointestinal:  Negative for abdominal pain, diarrhea, nausea and vomiting.  Endocrine: Negative for polydipsia and polyuria.  Genitourinary:  Negative for dysuria and hematuria.  Musculoskeletal:  Negative for neck pain and neck stiffness.  Skin:  Negative for rash.  Neurological:  Positive for seizures, weakness and numbness. Negative for dizziness.  Psychiatric/Behavioral:  Positive for sleep disturbance. Negative for agitation and behavioral problems.       Objective:    Physical Exam Vitals reviewed.  Constitutional:      General: She is not in acute distress.    Appearance: She is not diaphoretic.     Comments: In wheelchair  HENT:     Head: Normocephalic and atraumatic.     Nose: Nose normal. No congestion.     Mouth/Throat:     Mouth: Mucous membranes are moist.     Pharynx: No posterior oropharyngeal erythema.  Eyes:     General: No scleral icterus.    Extraocular Movements: Extraocular movements intact.  Cardiovascular:     Rate and Rhythm: Normal rate and regular rhythm.     Pulses: Normal pulses.     Heart sounds: Normal heart sounds. No murmur heard. Pulmonary:     Breath sounds: Normal breath sounds. No wheezing or rales.  Musculoskeletal:     Cervical back: Neck supple. No tenderness.     Right lower leg: Edema (1+) present.     Left lower leg: Edema (1+) present.  Skin:    General: Skin is warm.     Findings: No rash.  Neurological:     General: No focal deficit present.     Mental Status: She is alert and oriented to person, place, and time.     Sensory: No sensory  deficit.     Motor: Weakness (Left UE and LE, muscle strength 3/5) present.     Gait: Gait abnormal.  Psychiatric:        Mood and Affect: Mood normal.        Behavior: Behavior normal.     BP (!) 144/76 (BP Location: Right Arm)   Pulse 71   Ht 5\' 8"  (1.727 m)   LMP 05/14/2013   SpO2 99%   BMI 19.16 kg/m  Wt Readings from Last 3 Encounters:  11/04/21 126 lb (57.2  kg)  09/02/21 125 lb (56.7 kg)  04/11/21 120 lb (54.4 kg)    Lab Results  Component Value Date   TSH 1.620 09/28/2021   Lab Results  Component Value Date   WBC 8.4 09/28/2021   HGB 12.5 09/28/2021   HCT 35.0 09/28/2021   MCV 90 09/28/2021   PLT 138 (L) 09/28/2021   Lab Results  Component Value Date   NA 142 09/28/2021   K 3.8 09/28/2021   CO2 26 09/28/2021   GLUCOSE 103 (H) 09/28/2021   BUN 5 (L) 09/28/2021   CREATININE 0.74 09/28/2021   BILITOT 0.7 09/28/2021   ALKPHOS 77 09/28/2021   AST 20 09/28/2021   ALT 13 09/28/2021   PROT 7.6 09/28/2021   ALBUMIN 3.6 (L) 09/28/2021   CALCIUM 9.6 09/28/2021   ANIONGAP 11 04/11/2021   EGFR 95 09/28/2021   Lab Results  Component Value Date   CHOL 116 09/28/2021   Lab Results  Component Value Date   HDL 57 09/28/2021   Lab Results  Component Value Date   LDLCALC 47 09/28/2021   Lab Results  Component Value Date   TRIG 51 09/28/2021   Lab Results  Component Value Date   CHOLHDL 2.0 09/28/2021   Lab Results  Component Value Date   HGBA1C 5.3 09/28/2021      Assessment & Plan:   Problem List Items Addressed This Visit       Cardiovascular and Mediastinum   Essential hypertension - Primary    BP Readings from Last 1 Encounters:  08/17/22 (!) 144/76  Uncontrolled with Lisinopril 40 mg QD Added amlodipine 5 mg QD Counseled for compliance with the medications Advised DASH diet and moderate exercise/walking as tolerated      Relevant Medications   amLODipine (NORVASC) 5 MG tablet   Other Relevant Orders   CMP14+EGFR     Nervous and  Auditory   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    Due to Todd's paralysis Has a history of seizure disorder On phenytoin currently        Other   Seizures (Chronic)    On Phenytoin 300 mg QD Has not seen Neurology yet Referred to Neurology      Relevant Orders   TSH   CMP14+EGFR   CBC with Differential/Platelet   Phenytoin level, free and total   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)    Has residual left-sided UE and LE weakness Compliance with antiepileptic emphasized On phenytoin, refilled for now Referred to neurology      Relevant Orders   TSH   CMP14+EGFR   CBC with Differential/Platelet   Mixed hyperlipidemia (Chronic)    Continue Lipitor Check lipid profile      Relevant Medications   amLODipine (NORVASC) 5 MG tablet   Other Relevant Orders   Lipid panel   Primary insomnia (Chronic)    On Trazodone Sleep hygiene material provided      Vitamin D deficiency    Continue Vitamin D supplement      Relevant Orders   VITAMIN D 25 Hydroxy (Vit-D Deficiency, Fractures)   Other Visit Diagnoses     Hyperglycemia       Relevant Orders   Hemoglobin A1c      Meds ordered this encounter  Medications   amLODipine (NORVASC) 5 MG tablet    Sig: Take 1 tablet (5 mg total) by mouth daily.    Dispense:  90 tablet    Refill:  1    Follow-up: Return in about 3 months (around 11/16/2022) for Annual physical.    Anabel Halon, MD

## 2022-08-17 NOTE — Assessment & Plan Note (Signed)
Due to Todd's paralysis Has a history of seizure disorder On phenytoin currently

## 2022-08-17 NOTE — Assessment & Plan Note (Addendum)
BP Readings from Last 1 Encounters:  08/17/22 (!) 144/76   Uncontrolled with Lisinopril 40 mg QD Added amlodipine 5 mg QD Counseled for compliance with the medications Advised DASH diet and moderate exercise/walking as tolerated

## 2022-08-17 NOTE — Assessment & Plan Note (Signed)
Has residual left-sided UE and LE weakness Compliance with antiepileptic emphasized On phenytoin, refilled for now Referred to neurology 

## 2022-11-17 LAB — TSH: TSH: 1.71 u[IU]/mL (ref 0.450–4.500)

## 2022-11-17 LAB — CMP14+EGFR
ALT: 7 IU/L (ref 0–32)
AST: 17 IU/L (ref 0–40)
Albumin: 3.7 g/dL — ABNORMAL LOW (ref 3.8–4.9)
Alkaline Phosphatase: 57 IU/L (ref 44–121)
BUN/Creatinine Ratio: 15 (ref 9–23)
BUN: 12 mg/dL (ref 6–24)
Bilirubin Total: 0.7 mg/dL (ref 0.0–1.2)
CO2: 22 mmol/L (ref 20–29)
Calcium: 10 mg/dL (ref 8.7–10.2)
Chloride: 103 mmol/L (ref 96–106)
Creatinine, Ser: 0.8 mg/dL (ref 0.57–1.00)
Globulin, Total: 3.9 g/dL (ref 1.5–4.5)
Glucose: 84 mg/dL (ref 70–99)
Potassium: 4.5 mmol/L (ref 3.5–5.2)
Sodium: 141 mmol/L (ref 134–144)
Total Protein: 7.6 g/dL (ref 6.0–8.5)
eGFR: 86 mL/min/{1.73_m2} (ref >59)

## 2022-11-17 LAB — CBC WITH DIFFERENTIAL/PLATELET
Basophils Absolute: 0.1 10*3/uL (ref 0.0–0.2)
Basos: 1 %
EOS (ABSOLUTE): 0.2 10*3/uL (ref 0.0–0.4)
Eos: 3 %
Hematocrit: 37.4 % (ref 34.0–46.6)
Hemoglobin: 12.3 g/dL (ref 11.1–15.9)
Immature Grans (Abs): 0 10*3/uL (ref 0.0–0.1)
Immature Granulocytes: 0 %
Lymphocytes Absolute: 2.7 10*3/uL (ref 0.7–3.1)
Lymphs: 38 %
MCH: 32.1 pg (ref 26.6–33.0)
MCHC: 32.9 g/dL (ref 31.5–35.7)
MCV: 98 fL — ABNORMAL HIGH (ref 79–97)
Monocytes Absolute: 0.8 10*3/uL (ref 0.1–0.9)
Monocytes: 11 %
Neutrophils Absolute: 3.4 10*3/uL (ref 1.4–7.0)
Neutrophils: 47 %
Platelets: 128 10*3/uL — ABNORMAL LOW (ref 150–450)
RBC: 3.83 x10E6/uL (ref 3.77–5.28)
RDW: 13.3 % (ref 11.7–15.4)
WBC: 7.2 10*3/uL (ref 3.4–10.8)

## 2022-11-17 LAB — LIPID PANEL
Chol/HDL Ratio: 2.1 ratio (ref 0.0–4.4)
Cholesterol, Total: 140 mg/dL (ref 100–199)
HDL: 68 mg/dL (ref >39)
LDL Chol Calc (NIH): 60 mg/dL (ref 0–99)
Triglycerides: 53 mg/dL (ref 0–149)
VLDL Cholesterol Cal: 12 mg/dL (ref 5–40)

## 2022-11-17 LAB — HEMOGLOBIN A1C
Est. average glucose Bld gHb Est-mCnc: 97 mg/dL
Hgb A1c MFr Bld: 5 % (ref 4.8–5.6)

## 2022-11-17 LAB — VITAMIN D 25 HYDROXY (VIT D DEFICIENCY, FRACTURES): Vit D, 25-Hydroxy: 63.4 ng/mL (ref 30.0–100.0)

## 2022-11-22 ENCOUNTER — Other Ambulatory Visit: Payer: Self-pay

## 2022-11-22 DIAGNOSIS — R569 Unspecified convulsions: Secondary | ICD-10-CM

## 2022-11-24 ENCOUNTER — Encounter: Payer: Self-pay | Admitting: Internal Medicine

## 2022-11-24 ENCOUNTER — Ambulatory Visit (INDEPENDENT_AMBULATORY_CARE_PROVIDER_SITE_OTHER): Payer: Medicare Other | Admitting: Internal Medicine

## 2022-11-24 VITALS — BP 126/80 | HR 72 | Ht 68.0 in

## 2022-11-24 DIAGNOSIS — Z0001 Encounter for general adult medical examination with abnormal findings: Secondary | ICD-10-CM | POA: Diagnosis not present

## 2022-11-24 DIAGNOSIS — F1029 Alcohol dependence with unspecified alcohol-induced disorder: Secondary | ICD-10-CM

## 2022-11-24 DIAGNOSIS — I10 Essential (primary) hypertension: Secondary | ICD-10-CM | POA: Diagnosis not present

## 2022-11-24 DIAGNOSIS — R569 Unspecified convulsions: Secondary | ICD-10-CM

## 2022-11-24 DIAGNOSIS — G8194 Hemiplegia, unspecified affecting left nondominant side: Secondary | ICD-10-CM

## 2022-11-24 DIAGNOSIS — E782 Mixed hyperlipidemia: Secondary | ICD-10-CM

## 2022-11-24 DIAGNOSIS — D696 Thrombocytopenia, unspecified: Secondary | ICD-10-CM | POA: Insufficient documentation

## 2022-11-24 MED ORDER — LISINOPRIL 40 MG PO TABS
40.0000 mg | ORAL_TABLET | Freq: Every day | ORAL | 1 refills | Status: DC
Start: 2022-11-24 — End: 2023-05-09

## 2022-11-24 MED ORDER — ATORVASTATIN CALCIUM 20 MG PO TABS
20.0000 mg | ORAL_TABLET | Freq: Every day | ORAL | 3 refills | Status: DC
Start: 2022-11-24 — End: 2023-05-09

## 2022-11-24 MED ORDER — AMLODIPINE BESYLATE 5 MG PO TABS: 5.0000 mg | ORAL_TABLET | Freq: Every day | ORAL | 1 refills | Status: DC

## 2022-11-24 MED ORDER — PHENYTOIN SODIUM EXTENDED 100 MG PO CAPS
300.0000 mg | ORAL_CAPSULE | Freq: Every day | ORAL | 1 refills | Status: DC
Start: 2022-11-24 — End: 2023-05-09

## 2022-11-24 NOTE — Patient Instructions (Addendum)
Schedule your Medicare Annual Wellness Visit at checkout.  Please continue to take medications as prescribed.  Please continue to follow low salt diet.  Please take Vitamin D 2000 IU once daily.  Please consider gettingTdap vaccine at local pharmacy.  Please get blood tests before the next visit.

## 2022-11-24 NOTE — Progress Notes (Signed)
Established Patient Office Visit  Subjective:  Patient ID: Cynthia Mclean, female    DOB: 06-26-1965  Age: 57 y.o. MRN: [id number]  CC:  Chief Complaint  Patient presents with   Annual Exam    HPI Cynthia Mclean is a 57 y.o. 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  HTN: BP is wnl now now. She has been taking lisinopril 40 mg QD and amlodipine 5 mg QD.  Patient denies headache, dizziness, chest pain, dyspnea or palpitations.  Seizure disorder: She had been taking phenytoin 300 mg TID.  Denies any recent seizure-like activity.  She has not been binge drinking alcohol since the last visit, takes about 2 beers at times.  She has not followed up with neurologist yet.   Insomnia: She also reports insomnia, which is improved with trazodone. She had difficulty initiating and maintaining sleep.  She has spells of anxiety, but usually gets better with hydroxyzine as needed.  Denies SI or HI currently.    Past Medical History:  Diagnosis Date   Alcohol dependency (HCC)    HELICOBACTER PYLORI [H. PYLORI] INFECTION 02/24/2010   Qualifier: Diagnosis of  By: De Blanch.    Hypertension    IDA (iron deficiency anemia)    required blood tranfusion   Nicotine addiction    Seizure disorder [facility])     Past Surgical History:  Procedure Laterality Date   btl     left knee surgery, fracture      Family History  Problem Relation Age of Onset   Anxiety disorder Sister     Social History   Socioeconomic History   Marital status: Single    Spouse name: Not on file   Number of children: 5   Years of education: Not on file   Highest education level: Not on file  Occupational History   Occupation: works at shelter  Tobacco Use   Smoking status: Never   Smokeless tobacco: Current    Types: Snuff   Substance and Sexual Activity   Alcohol use: Not Currently    Comment: last drank last night 1 40 oz   Drug use: No   Sexual activity: Yes    Birth control/protection: Surgical  Other Topics Concern   Not on file  Social History Narrative   Not on file   Social Determinants of Health   Financial Resource Strain: Not on file  Food Insecurity: Not on file  Transportation Needs: Not on file  Physical Activity: Not on file  Stress: Not on file  Social Connections: Not on file  Intimate Partner Violence: Not on file    Outpatient Medications Prior to Visit  Medication Sig Dispense Refill   acetaminophen (TYLENOL) 325 MG tablet Take 2 tablets (650 mg total) by mouth every 4 (four) hours as needed for mild pain (or temp >/= 99.5). 30 tablet 2   Famotidine (PEPCID PO) Take 1 tablet by mouth daily as needed (indigestion).     hydrOXYzine (ATARAX) 25 MG tablet Take 1 tablet (25 mg total) by mouth 2 (two) times daily as needed for anxiety. 30 tablet 2   Iron, Ferrous Sulfate, 325 (65 Fe) MG TABS Take 325 mg by mouth daily. 30 tablet 5   Misc. Devices (ROLLER [place] [employer]) MISC For walking assistance. 1 each 0   Misc. Devices MISC Left hand resting splint 1 each  0   Multiple Vitamin (MULTIVITAMIN WITH MINERALS) TABS tablet Take 1 tablet by mouth daily. 120 tablet 3   traZODone (DESYREL) 50 MG tablet Take 0.5-1 tablets (25-50 mg total) by mouth at bedtime as needed for sleep. 30 tablet 3   UNABLE TO FIND Med Name: Adult diapers,Pads,Wipes. DX:R32 1 each 99   amLODipine (NORVASC) 5 MG tablet Take 1 tablet (5 mg total) by mouth daily. 90 tablet 1   atorvastatin (LIPITOR) 20 MG tablet Take 1 tablet (20 mg total) by mouth daily. 90 tablet 3   lisinopril (ZESTRIL) 40 MG tablet Take 1 tablet (40 mg total) by mouth daily. 90 tablet 1   phenytoin (DILANTIN) 100 MG ER capsule Take 3 capsules (300 mg total) by mouth daily. 90 capsule 5   Vitamin D, Ergocalciferol, (DRISDOL) 1.25 MG (50000 UNIT) CAPS  capsule Take 1 capsule (50,000 Units total) by mouth every 7 (seven) days. 12 capsule 1   No facility-administered medications prior to visit.    No Known Allergies  ROS Review of Systems  Constitutional:  Negative for chills and fever.  HENT:  Negative for congestion, sinus pressure, sinus pain and sore throat.   Eyes:  Negative for pain and discharge.  Respiratory:  Negative for cough and shortness of breath.   Cardiovascular:  Negative for chest pain and palpitations.  Gastrointestinal:  Negative for abdominal pain, diarrhea, nausea and vomiting.  Endocrine: Negative for polydipsia and polyuria.  Genitourinary:  Negative for dysuria and hematuria.  Musculoskeletal:  Negative for neck pain and neck stiffness.  Skin:  Negative for rash.  Neurological:  Positive for seizures, weakness and numbness. Negative for dizziness.  Psychiatric/Behavioral:  Positive for sleep disturbance. Negative for agitation and behavioral problems.       Objective:    Physical Exam Vitals reviewed.  Constitutional:      General: She is not in acute distress.    Appearance: She is not diaphoretic.     Comments: In wheelchair  HENT:     Head: Normocephalic and atraumatic.     Nose: Nose normal. No congestion.     Mouth/Throat:     Mouth: Mucous membranes are moist.     Pharynx: No posterior oropharyngeal erythema.  Eyes:     General: No scleral icterus.    Extraocular Movements: Extraocular movements intact.  Cardiovascular:     Rate and Rhythm: Normal rate and regular rhythm.     Pulses: Normal pulses.     Heart sounds: Normal heart sounds. No murmur heard. Pulmonary:     Breath sounds: Normal breath sounds. No wheezing or rales.  Musculoskeletal:     Cervical back: Neck supple. No tenderness.     Right lower leg: Edema (Mild) present.     Left lower leg: Edema (Mild) present.  Skin:    General: Skin is warm.     Findings: No rash.  Neurological:     General: No focal deficit present.      Mental Status: She is alert and oriented to person, place, and time.     Sensory: No sensory deficit.     Motor: Weakness (Left UE and LE, muscle strength 3/5) present.     Gait: Gait abnormal.  Psychiatric:        Mood and Affect: Mood normal.        Behavior: Behavior normal.     BP 126/80 (BP Location: Right Arm, Patient Position: Sitting, Cuff Size: Normal)   Pulse 72   Ht 5'  8" (1.727 m)   LMP 05/14/2013   SpO2 99%   BMI 19.16 kg/m  Wt Readings from Last 3 Encounters:  11/04/21 126 lb (57.2 kg)  09/02/21 125 lb (56.7 kg)  04/11/21 120 lb (54.4 kg)    Lab Results  Component Value Date   TSH 1.710 11/16/2022   Lab Results  Component Value Date   WBC 7.2 11/16/2022   HGB 12.3 11/16/2022   HCT 37.4 11/16/2022   MCV 98 (H) 11/16/2022   PLT 128 (L) 11/16/2022   Lab Results  Component Value Date   NA 141 11/16/2022   K 4.5 11/16/2022   CO2 22 11/16/2022   GLUCOSE 84 11/16/2022   BUN 12 11/16/2022   CREATININE 0.80 11/16/2022   BILITOT 0.7 11/16/2022   ALKPHOS 57 11/16/2022   AST 17 11/16/2022   ALT 7 11/16/2022   PROT 7.6 11/16/2022   ALBUMIN 3.7 (L) 11/16/2022   CALCIUM 10.0 11/16/2022   ANIONGAP 11 04/11/2021   EGFR 86 11/16/2022   Lab Results  Component Value Date   CHOL 140 11/16/2022   Lab Results  Component Value Date   HDL 68 11/16/2022   Lab Results  Component Value Date   LDLCALC 60 11/16/2022   Lab Results  Component Value Date   TRIG 53 11/16/2022   Lab Results  Component Value Date   CHOLHDL 2.1 11/16/2022   Lab Results  Component Value Date   HGBA1C 5.0 11/16/2022      Assessment & Plan:   Problem List Items Addressed This Visit       Cardiovascular and Mediastinum   Essential hypertension    BP Readings from Last 1 Encounters:  11/24/22 126/80   Well-controlled with Lisinopril 40 mg and amlodipine 5 mg QD Counseled for compliance with the medications Advised DASH diet and moderate exercise/walking as  tolerated      Relevant Medications   amLODipine (NORVASC) 5 MG tablet   lisinopril (ZESTRIL) 40 MG tablet   atorvastatin (LIPITOR) 20 MG tablet     Nervous and Auditory   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)    Due to Todd's paralysis Has a history of seizure disorder On phenytoin currently        Hematopoietic and Hemostatic   Thrombocytopenia (HCC)    Could be medication induced She is on Phenytoin Check CBC and Phenytoin level      Relevant Orders   CBC with Differential/Platelet     Other   Seizures (HCC) (Chronic)    On Phenytoin 300 mg QD Has not seen Neurology yet Referred to Neurology      Relevant Medications   phenytoin (DILANTIN) 100 MG ER capsule   Other Relevant Orders   Dilantin (Phenytoin) level, total   Mixed hyperlipidemia (Chronic)    Continue Lipitor Checked lipid profile      Relevant Medications   amLODipine (NORVASC) 5 MG tablet   lisinopril (ZESTRIL) 40 MG tablet   atorvastatin (LIPITOR) 20 MG tablet   Alcohol dependence with unspecified alcohol-induced disorder (HCC)    States that she does not binge drink now Needs to take multivitamin as she has macrocytic anemia Advised to avoid alcoholic beverages      Encounter for general adult medical examination with abnormal findings - Primary    Physical exam as documented. Fasting blood tests today.       Meds ordered this encounter  Medications   amLODipine (NORVASC) 5 MG tablet  Sig: Take 1 tablet (5 mg total) by mouth daily.    Dispense:  90 tablet    Refill:  1   lisinopril (ZESTRIL) 40 MG tablet    Sig: Take 1 tablet (40 mg total) by mouth daily.    Dispense:  90 tablet    Refill:  1   atorvastatin (LIPITOR) 20 MG tablet    Sig: Take 1 tablet (20 mg total) by mouth daily.    Dispense:  90 tablet    Refill:  3   phenytoin (DILANTIN) 100 MG ER capsule    Sig: Take 3 capsules (300 mg total) by mouth daily.    Dispense:  270  capsule    Refill:  1    Follow-up: Return in about 4 months (around 03/26/2023) for HTN and thrombocytopenia.    Anabel Halon, MD

## 2022-11-24 NOTE — Assessment & Plan Note (Signed)
Due to Cynthia Mclean's paralysis Has a history of seizure disorder On phenytoin currently

## 2022-11-24 NOTE — Assessment & Plan Note (Signed)
BP Readings from Last 1 Encounters:  11/24/22 126/80   Well-controlled with Lisinopril 40 mg and amlodipine 5 mg QD Counseled for compliance with the medications Advised DASH diet and moderate exercise/walking as tolerated

## 2022-11-24 NOTE — Assessment & Plan Note (Signed)
States that she does not binge drink now Needs to take multivitamin as she has macrocytic anemia Advised to avoid alcoholic beverages

## 2022-11-24 NOTE — Assessment & Plan Note (Signed)
Continue Lipitor Checked lipid profile

## 2022-11-24 NOTE — Assessment & Plan Note (Signed)
On Phenytoin 300 mg QD Has not seen Neurology yet Referred to Neurology

## 2022-11-24 NOTE — Assessment & Plan Note (Signed)
Physical exam as documented. Fasting blood tests today. 

## 2022-11-24 NOTE — Assessment & Plan Note (Signed)
Could be medication induced She is on Phenytoin Check CBC and Phenytoin level

## 2022-11-29 ENCOUNTER — Other Ambulatory Visit: Payer: Self-pay | Admitting: Internal Medicine

## 2022-11-29 DIAGNOSIS — R569 Unspecified convulsions: Secondary | ICD-10-CM

## 2022-11-29 DIAGNOSIS — G8384 Todd's paralysis (postepileptic): Secondary | ICD-10-CM

## 2023-01-10 ENCOUNTER — Telehealth: Payer: Self-pay | Admitting: Internal Medicine

## 2023-01-10 NOTE — Telephone Encounter (Signed)
PCS  Noted  Copied Sleeved  Original in PCP box Copy front desk folder

## 2023-01-17 ENCOUNTER — Telehealth: Payer: Self-pay | Admitting: Internal Medicine

## 2023-01-17 NOTE — Telephone Encounter (Signed)
Patient came by office picked up forms

## 2023-01-17 NOTE — Telephone Encounter (Signed)
Patient came by dropped off 2 sets of cap program forms need ASAP  Noted Copied Sleeved  Fax and call patient when ready to pick up

## 2023-01-18 NOTE — Telephone Encounter (Signed)
Called patient to let her know forms are ready for pick up, no answer

## 2023-01-19 ENCOUNTER — Ambulatory Visit: Payer: Medicare Other

## 2023-01-19 VITALS — Ht 66.0 in | Wt 137.0 lb

## 2023-01-19 DIAGNOSIS — Z0000 Encounter for general adult medical examination without abnormal findings: Secondary | ICD-10-CM | POA: Diagnosis not present

## 2023-01-19 DIAGNOSIS — Z1231 Encounter for screening mammogram for malignant neoplasm of breast: Secondary | ICD-10-CM

## 2023-01-19 NOTE — Patient Instructions (Signed)
Cynthia Mclean , Thank you for taking time to come for your Medicare Wellness Visit. I appreciate your ongoing commitment to your health goals. Please review the following plan we discussed and let me know if I can assist you in the future.   Referrals/Orders/Follow-Ups/Clinician Recommendations: Aim for 30 minutes of exercise or brisk walking, 6-8 glasses of water, and 5 servings of fruits and vegetables each day.   This is a list of the screening recommended for you and due dates:  Health Maintenance  Topic Date Due   Colon Cancer Screening  Never done   DTaP/Tdap/Td vaccine (2 - Tdap) 04/28/2011   Pap with HPV screening  01/18/2015   Mammogram  11/07/2022   Flu Shot  11/25/2022   COVID-19 Vaccine (1 - 2023-24 season) Never done   Medicare Annual Wellness Visit  01/19/2024   Hepatitis C Screening  Completed   HIV Screening  Completed   Zoster (Shingles) Vaccine  Completed   HPV Vaccine  Aged Out    Advanced directives: (Copy Requested) Please bring a copy of your health care power of attorney and living will to the office to be added to your chart at your convenience.  Next Medicare Annual Wellness Visit scheduled for next year: Yes   insert Preventive Care Attachment Reference

## 2023-01-19 NOTE — Progress Notes (Signed)
Subjective:   Cynthia Mclean is a 57 y.o. female who presents for Medicare Annual (Subsequent) preventive examination.  Visit Complete: Virtual  I connected with  Cynthia Mclean on 01/19/23 by a audio enabled telemedicine application and verified that I am speaking with the correct person using two identifiers.  Patient Location: Home  Provider Location: Home Office  I discussed the limitations of evaluation and management by telemedicine. The patient expressed understanding and agreed to proceed.  Patient Medicare AWV questionnaire was completed by the patient on 01/16/2023; I have confirmed that all information answered by patient is correct and no changes since this date.  Cardiac Risk Factors include: advanced age (>10men, >40 women);hypertension;dyslipidemia;sedentary lifestyleBecause this visit was a virtual/telehealth visit, some criteria may be missing or patient reported. Any vitals not documented were not able to be obtained and vitals that have been documented are patient reported.       Objective:    Today's Vitals   01/19/23 1137  Weight: 137 lb (62.1 kg)  Height: 5\' 6"  (1.676 m)   Body mass index is 22.11 kg/m.     01/19/2023   11:39 AM 04/11/2021    5:16 PM 08/05/2020    1:09 PM 05/16/2020    9:38 AM 03/11/2020   11:00 PM 03/11/2020   12:00 PM 03/11/2020    7:53 AM  Advanced Directives  Does Patient Have a Medical Advance Directive? Yes No No No No No No  Type of Estate agent of [place];Living will        Copy of Healthcare Power of Attorney in Chart? No - copy requested        Would patient like information on creating a medical advance directive?   No - Patient declined  No - Patient declined No - Patient declined     Current Medications (verified) Outpatient Encounter Medications as of 01/19/2023  Medication Sig   acetaminophen (TYLENOL) 325 MG tablet Take 2 tablets (650 mg total) by mouth every 4 (four) hours as needed for mild pain  (or temp >/= 99.5).   amLODipine (NORVASC) 5 MG tablet Take 1 tablet (5 mg total) by mouth daily.   atorvastatin (LIPITOR) 20 MG tablet Take 1 tablet (20 mg total) by mouth daily.   Famotidine (PEPCID PO) Take 1 tablet by mouth daily as needed (indigestion).   hydrOXYzine (ATARAX) 25 MG tablet Take 1 tablet (25 mg total) by mouth 2 (two) times daily as needed for anxiety.   Iron, Ferrous Sulfate, 325 (65 Fe) MG TABS Take 325 mg by mouth daily.   lisinopril (ZESTRIL) 40 MG tablet Take 1 tablet (40 mg total) by mouth daily.   Misc. Devices (ROLLER Aten) MISC For walking assistance.   Misc. Devices MISC Left hand resting splint   Multiple Vitamin (MULTIVITAMIN WITH MINERALS) TABS tablet Take 1 tablet by mouth daily.   phenytoin (DILANTIN) 100 MG ER capsule Take 3 capsules (300 mg total) by mouth daily.   traZODone (DESYREL) 50 MG tablet Take 0.5-1 tablets (25-50 mg total) by mouth at bedtime as needed for sleep.   UNABLE TO FIND Med Name: Adult diapers,Pads,Wipes. DX:[id number]   No facility-administered encounter medications on file as of 01/19/2023.    Allergies (verified) Patient has no known allergies.   History: Past Medical History:  Diagnosis Date   Alcohol dependency (HCC)    HELICOBACTER PYLORI [H. PYLORI] INFECTION 02/24/2010   Qualifier: Diagnosis of  By: De Blanch.    Hypertension  IDA (iron deficiency anemia)    required blood tranfusion   Nicotine addiction    Seizure disorder [facility])    Past Surgical History:  Procedure Laterality Date   btl     left knee surgery, fracture     Family History  Problem Relation Age of Onset   Anxiety disorder Sister    Social History   Socioeconomic History   Marital status: Single    Spouse name: Not on file   Number of children: 5   Years of education: Not on file   Highest education level: Not on file  Occupational History   Occupation: works at shelter  Tobacco Use   Smoking status: Never   Smokeless tobacco:  Current    Types: Snuff  Substance and Sexual Activity   Alcohol use: Not Currently    Comment: last drank last night 1 40 oz   Drug use: No   Sexual activity: Yes    Birth control/protection: Surgical  Other Topics Concern   Not on file  Social History Narrative   Not on file   Social Determinants of Health   Financial Resource Strain: Low Risk  (01/19/2023)   Overall Financial Resource Strain (CARDIA)    Difficulty of Paying Living Expenses: Not hard at all  Food Insecurity: No Food Insecurity (01/19/2023)   Hunger Vital Sign    Worried About Running Out of Food in the Last Year: Never true    Ran Out of Food in the Last Year: Never true  Transportation Needs: No Transportation Needs (01/19/2023)   PRAPARE - Administrator, Civil Service (Medical): No    Lack of Transportation (Non-Medical): No  Physical Activity: Inactive (01/19/2023)   Exercise Vital Sign    Days of Exercise per Week: 0 days    Minutes of Exercise per Session: 0 min  Stress: No Stress Concern Present (01/19/2023)   [employer] of Occupational Health - Occupational Stress Questionnaire    Feeling of Stress : Not at all  Social Connections: Moderately Isolated (01/19/2023)   Social Connection and Isolation Panel [NHANES]    Frequency of Communication with Friends and Family: More than three times a week    Frequency of Social Gatherings with Friends and Family: More than three times a week    Attends Religious Services: More than 4 times per year    Active Member of [employer] or Organizations: No    Attends Engineer, structural: Never    Marital Status: Never married    Tobacco Counseling Ready to quit: Not Answered Counseling given: Not Answered   Clinical Intake:  Pre-visit preparation completed: Yes  Pain : No/denies pain     Nutritional Risks: None Diabetes: No  How often do you need to have someone help you when you read instructions, pamphlets, or other written  materials from your doctor or pharmacy?: 1 - Never  Interpreter Needed?: No  Information entered by :: Renie Ora, LPN   Activities of Daily Living    01/19/2023   11:39 AM  In your present state of health, do you have any difficulty performing the following activities:  Hearing? 0  Vision? 0  Difficulty concentrating or making decisions? 0  Walking or climbing stairs? 0  Dressing or bathing? 0  Doing errands, shopping? 0  Preparing Food and eating ? N  Using the Toilet? N  In the past six months, have you accidently leaked urine? N  Do you have problems with loss of  bowel control? N  Managing your Medications? N  Managing your Finances? N  Housekeeping or managing your Housekeeping? N    Patient Care Team: Anabel Halon, MD as PCP - General (Internal Medicine)  Indicate any recent Medical Services you may have received from other than [facility] providers in the past year (date may be approximate).     Assessment:   This is a routine wellness examination for Raechelle.  Hearing/Vision screen Vision Screening - Comments:: Wears rx glasses - up to date with routine eye exams with  [facility]   Goals Addressed             This Visit's Progress    Exercise 3x per week (30 min per time)         Depression Screen    01/19/2023   11:38 AM 11/24/2022    1:24 PM 08/17/2022    1:31 PM 06/21/2022    3:52 PM 01/28/2022    1:18 PM 11/04/2021    4:18 PM 09/28/2021    2:33 PM  PHQ 2/9 Scores  PHQ - 2 Score 0 1 0 0 0 0 0    Fall Risk    01/19/2023   11:37 AM 11/24/2022    1:24 PM 08/17/2022    1:31 PM 06/21/2022    3:52 PM 01/28/2022    1:18 PM  Fall Risk   Falls in the past year? 0 0 0 0 0  Number falls in past yr: 0 0 0 0 0  Injury with Fall? 0 0 0 0 0  Risk for fall due to : No Fall Risks    No Fall Risks  Follow up Falls prevention discussed    Falls evaluation completed    MEDICARE RISK AT HOME: Medicare Risk at Home Any stairs in or around the home?: No If so, are there  any without handrails?: No Home free of loose throw rugs in walkways, pet beds, electrical cords, etc?: Yes Adequate lighting in your home to reduce risk of falls?: Yes Life alert?: No Use of a cane, walker or w/c?: No Grab bars in the bathroom?: Yes Shower chair or bench in shower?: Yes Elevated toilet seat or a handicapped toilet?: Yes  TIMED UP AND GO:  Was the test performed?  No    Cognitive Function:        01/19/2023   11:40 AM  6CIT Screen  What Year? 0 points  What month? 0 points  What time? 0 points  Count back from 20 0 points  Months in reverse 0 points  Repeat phrase 0 points  Total Score 0 points    Immunizations Immunization History  Administered Date(s) Administered   Influenza,inj,Quad PF,6+ Mos 04/08/2015   Td 04/27/2001   Zoster Recombinant(Shingrix) 09/28/2021, 01/28/2022    TDAP status: Due, Education has been provided regarding the importance of this vaccine. Advised may receive this vaccine at local pharmacy or Health Dept. Aware to provide a copy of the vaccination record if obtained from local pharmacy or Health Dept. Verbalized acceptance and understanding.  Flu Vaccine status: Declined, Education has been provided regarding the importance of this vaccine but patient still declined. Advised may receive this vaccine at local pharmacy or Health Dept. Aware to provide a copy of the vaccination record if obtained from local pharmacy or Health Dept. Verbalized acceptance and understanding.  Pneumococcal vaccine status: Due, Education has been provided regarding the importance of this vaccine. Advised may receive this vaccine at local pharmacy or Health  Dept. Aware to provide a copy of the vaccination record if obtained from local pharmacy or Health Dept. Verbalized acceptance and understanding.  Covid-19 vaccine status: Declined, Education has been provided regarding the importance of this vaccine but patient still declined. Advised may receive this  vaccine at local pharmacy or Health Dept.or vaccine clinic. Aware to provide a copy of the vaccination record if obtained from local pharmacy or Health Dept. Verbalized acceptance and understanding.  Qualifies for Shingles Vaccine? Yes   Zostavax completed No   Shingrix Completed?: No.    Education has been provided regarding the importance of this vaccine. Patient has been advised to call insurance company to determine out of pocket expense if they have not yet received this vaccine. Advised may also receive vaccine at local pharmacy or Health Dept. Verbalized acceptance and understanding.  Screening Tests Health Maintenance  Topic Date Due   Colonoscopy  Never done   DTaP/Tdap/Td (2 - Tdap) 04/28/2011   Cervical Cancer Screening (HPV/Pap Cotest)  01/18/2015   MAMMOGRAM  11/07/2022   INFLUENZA VACCINE  11/25/2022   COVID-19 Vaccine (1 - 2023-24 season) Never done   Medicare Annual Wellness (AWV)  01/19/2024   Hepatitis C Screening  Completed   HIV Screening  Completed   Zoster Vaccines- Shingrix  Completed   HPV VACCINES  Aged Out    Health Maintenance  Health Maintenance Due  Topic Date Due   Colonoscopy  Never done   DTaP/Tdap/Td (2 - Tdap) 04/28/2011   Cervical Cancer Screening (HPV/Pap Cotest)  01/18/2015   MAMMOGRAM  11/07/2022   INFLUENZA VACCINE  11/25/2022   COVID-19 Vaccine (1 - 2023-24 season) Never done    Colorectal cancer screening: Referral to GI placed patient declined . Pt aware the office will call re: appt.  Mammogram status: Ordered 01/19/2023. Pt provided with contact info and advised to call to schedule appt.   Bone Density status: Ordered not of age . Pt provided with contact info and advised to call to schedule appt.  Lung Cancer Screening: (Low Dose CT Chest recommended if Age 76-80 years, 20 pack-year currently smoking OR have quit w/in 15years.) does not qualify.   Lung Cancer Screening Referral: n/a  Additional Screening:  Hepatitis C  Screening: does not qualify; Completed 06/26/2021  Vision Screening: Recommended annual ophthalmology exams for early detection of glaucoma and other disorders of the eye. Is the patient up to date with their annual eye exam?  Yes  Who is the provider or what is the name of the office in which the patient attends annual eye exams? [facility] If pt is not established with a provider, would they like to be referred to a provider to establish care? No .   Dental Screening: Recommended annual dental exams for proper oral hygiene   Community Resource Referral / Chronic Care Management: CRR required this visit?  No   CCM required this visit?  No     Plan:     I have personally reviewed and noted the following in the patient's chart:   Medical and social history Use of alcohol, tobacco or illicit drugs  Current medications and supplements including opioid prescriptions. Patient is not currently taking opioid prescriptions. Functional ability and status Nutritional status Physical activity Advanced directives List of other physicians Hospitalizations, surgeries, and ER visits in previous 12 months Vitals Screenings to include cognitive, depression, and falls Referrals and appointments  In addition, I have reviewed and discussed with patient certain preventive protocols, quality metrics, and best  practice recommendations. A written personalized care plan for preventive services as well as general preventive health recommendations were provided to patient.     Lorrene Reid, LPN   1/61/0960   After Visit Summary: (MyChart) Due to this being a telephonic visit, the after visit summary with patients personalized plan was offered to patient via MyChart   Nurse Notes: Due Tdap/pneumonia  Vaccine

## 2023-01-20 NOTE — Telephone Encounter (Signed)
Patient picked up forms.

## 2023-03-28 ENCOUNTER — Ambulatory Visit: Payer: Medicare Other | Admitting: Internal Medicine

## 2023-05-05 ENCOUNTER — Ambulatory Visit: Payer: Medicare Other | Admitting: Internal Medicine

## 2023-05-09 ENCOUNTER — Encounter: Payer: Self-pay | Admitting: Internal Medicine

## 2023-05-09 ENCOUNTER — Ambulatory Visit (INDEPENDENT_AMBULATORY_CARE_PROVIDER_SITE_OTHER): Payer: Medicare Other | Admitting: Internal Medicine

## 2023-05-09 VITALS — BP 137/85 | HR 81 | Ht 68.0 in | Wt 130.0 lb

## 2023-05-09 DIAGNOSIS — Z1211 Encounter for screening for malignant neoplasm of colon: Secondary | ICD-10-CM

## 2023-05-09 DIAGNOSIS — R569 Unspecified convulsions: Secondary | ICD-10-CM | POA: Diagnosis not present

## 2023-05-09 DIAGNOSIS — F1029 Alcohol dependence with unspecified alcohol-induced disorder: Secondary | ICD-10-CM

## 2023-05-09 DIAGNOSIS — E782 Mixed hyperlipidemia: Secondary | ICD-10-CM | POA: Diagnosis not present

## 2023-05-09 DIAGNOSIS — F5101 Primary insomnia: Secondary | ICD-10-CM | POA: Diagnosis not present

## 2023-05-09 DIAGNOSIS — I10 Essential (primary) hypertension: Secondary | ICD-10-CM

## 2023-05-09 DIAGNOSIS — M25452 Effusion, left hip: Secondary | ICD-10-CM

## 2023-05-09 DIAGNOSIS — G8384 Todd's paralysis (postepileptic): Secondary | ICD-10-CM

## 2023-05-09 MED ORDER — TRAZODONE HCL 50 MG PO TABS: 25.0000 mg | ORAL_TABLET | Freq: Every evening | ORAL | 3 refills | Status: DC | PRN

## 2023-05-09 MED ORDER — AMLODIPINE BESYLATE 5 MG PO TABS
5.0000 mg | ORAL_TABLET | Freq: Every day | ORAL | 1 refills | Status: DC
Start: 2023-05-09 — End: 2023-09-04

## 2023-05-09 MED ORDER — ATORVASTATIN CALCIUM 20 MG PO TABS
20.0000 mg | ORAL_TABLET | Freq: Every day | ORAL | 3 refills | Status: AC
Start: 2023-05-09 — End: ?

## 2023-05-09 MED ORDER — LISINOPRIL 40 MG PO TABS
40.0000 mg | ORAL_TABLET | Freq: Every day | ORAL | 1 refills | Status: DC
Start: 2023-05-09 — End: 2023-09-04

## 2023-05-09 MED ORDER — PHENYTOIN SODIUM EXTENDED 100 MG PO CAPS: 300.0000 mg | ORAL_CAPSULE | Freq: Every day | ORAL | 1 refills | Status: DC

## 2023-05-09 NOTE — Patient Instructions (Addendum)
Please continue to take medications as prescribed. ? ?Please continue to follow low salt diet and ambulate as tolerated. ?

## 2023-05-09 NOTE — Progress Notes (Signed)
 Established Patient Office Visit  Subjective:  Patient ID: Cynthia Mclean, female    DOB: February 19, 1966  Age: 58 y.o. MRN: [id number]  CC:  Chief Complaint  Patient presents with   Seizures    4 month f/u, Also having hip pain on lefts side unsure what happened started around 04/25/2023.    Hypertension    HPI Cynthia Mclean is a 58 y.o. 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  HTN: BP is wnl now. She has been taking lisinopril  40 mg QD and amlodipine  5 mg QD.  Patient denies headache, dizziness, chest pain, dyspnea or palpitations.  Seizure disorder: She takes phenytoin  300 mg QD, but is noncompliant.  Denies any recent seizure-like activity.  She has not been binge drinking alcohol since the last visit, takes about 2 beers at times.  She has not followed up with neurologist yet.   Insomnia: She also reports insomnia, which is improved with trazodone . She had difficulty initiating and maintaining sleep.  She has spells of anxiety, but usually gets better with hydroxyzine  as needed.  Denies SI or HI currently.  She reports left-sided hip swelling since 04/25/23.  She denies major pain concern.  Of note, she is wheelchair dependent.  Denies any recent injury or fall.  Past Medical History:  Diagnosis Date   Alcohol dependency (HCC)    HELICOBACTER PYLORI [H. PYLORI] INFECTION 02/24/2010   Qualifier: Diagnosis of  By: Ezzard DEVONNA Sonny GORMAN.    Hypertension    IDA (iron  deficiency anemia)    required blood tranfusion   Nicotine addiction    Seizure disorder [facility])     Past Surgical History:  Procedure Laterality Date   btl     left knee surgery, fracture      Family History  Problem Relation Age of Onset   Anxiety disorder Sister     Social History   Socioeconomic History    Marital status: Single    Spouse name: Not on file   Number of children: 5   Years of education: Not on file   Highest education level: Not on file  Occupational History   Occupation: works at shelter  Tobacco Use   Smoking status: Never   Smokeless tobacco: Current    Types: Snuff  Substance and Sexual Activity   Alcohol use: Not Currently    Comment: last drank last night 1 40 oz   Drug use: No   Sexual activity: Yes    Birth control/protection: Surgical  Other Topics Concern   Not on file  Social History Narrative   Not on file   Social Drivers of Health   Financial Resource Strain: Low Risk  (01/19/2023)   Overall Financial Resource Strain (CARDIA)    Difficulty of Paying Living Expenses: Not hard at all  Food Insecurity: No Food Insecurity (01/19/2023)   Hunger Vital Sign    Worried About Running Out of Food in the Last Year: Never true    Ran Out of Food in the Last Year: Never true  Transportation Needs: No Transportation Needs (01/19/2023)   PRAPARE - Administrator, Civil Service (Medical): No    Lack of Transportation (Non-Medical): No  Physical Activity: Inactive (01/19/2023)   Exercise Vital Sign    Days of Exercise per Week: 0 days    Minutes of  Exercise per Session: 0 min  Stress: No Stress Concern Present (01/19/2023)   [employer] of Occupational Health - Occupational Stress Questionnaire    Feeling of Stress : Not at all  Social Connections: Moderately Isolated (01/19/2023)   Social Connection and Isolation Panel [NHANES]    Frequency of Communication with Friends and Family: More than three times a week    Frequency of Social Gatherings with Friends and Family: More than three times a week    Attends Religious Services: More than 4 times per year    Active Member of [employer] or Organizations: No    Attends Banker Meetings: Never    Marital Status: Never married  Intimate Partner Violence: Not At Risk (01/19/2023)   Humiliation,  Afraid, Rape, and Kick questionnaire    Fear of Current or Ex-Partner: No    Emotionally Abused: No    Physically Abused: No    Sexually Abused: No    Outpatient Medications Prior to Visit  Medication Sig Dispense Refill   acetaminophen  (TYLENOL ) 325 MG tablet Take 2 tablets (650 mg total) by mouth every 4 (four) hours as needed for mild pain (or temp >/= 99.5). 30 tablet 2   Famotidine (PEPCID PO) Take 1 tablet by mouth daily as needed (indigestion).     hydrOXYzine  (ATARAX ) 25 MG tablet Take 1 tablet (25 mg total) by mouth 2 (two) times daily as needed for anxiety. 30 tablet 2   Iron , Ferrous Sulfate , 325 (65 Fe) MG TABS Take 325 mg by mouth daily. 30 tablet 5   Misc. Devices (ROLLER Wisdom) MISC For walking assistance. 1 each 0   Misc. Devices MISC Left hand resting splint 1 each 0   Multiple Vitamin (MULTIVITAMIN WITH MINERALS) TABS tablet Take 1 tablet by mouth daily. 120 tablet 3   UNABLE TO FIND Med Name: Adult diapers,Pads,Wipes. DX:R32 1 each 99   amLODipine  (NORVASC ) 5 MG tablet Take 1 tablet (5 mg total) by mouth daily. 90 tablet 1   atorvastatin  (LIPITOR) 20 MG tablet Take 1 tablet (20 mg total) by mouth daily. 90 tablet 3   lisinopril  (ZESTRIL ) 40 MG tablet Take 1 tablet (40 mg total) by mouth daily. 90 tablet 1   phenytoin  (DILANTIN ) 100 MG ER capsule Take 3 capsules (300 mg total) by mouth daily. 270 capsule 1   traZODone  (DESYREL ) 50 MG tablet Take 0.5-1 tablets (25-50 mg total) by mouth at bedtime as needed for sleep. 30 tablet 3   No facility-administered medications prior to visit.    No Known Allergies  ROS Review of Systems  Constitutional:  Negative for chills and fever.  HENT:  Negative for congestion, sinus pressure, sinus pain and sore throat.   Eyes:  Negative for pain and discharge.  Respiratory:  Negative for cough and shortness of breath.   Cardiovascular:  Negative for chest pain and palpitations.  Gastrointestinal:  Negative for abdominal pain,  diarrhea, nausea and vomiting.  Endocrine: Negative for polydipsia and polyuria.  Genitourinary:  Negative for dysuria and hematuria.  Musculoskeletal:  Positive for arthralgias (Left hip). Negative for neck pain and neck stiffness.  Skin:  Negative for rash.  Neurological:  Positive for seizures, weakness and numbness. Negative for dizziness.  Psychiatric/Behavioral:  Positive for sleep disturbance. Negative for agitation and behavioral problems.       Objective:    Physical Exam Vitals reviewed.  Constitutional:      General: She is not in acute distress.    Appearance: She is  not diaphoretic.     Comments: In wheelchair  HENT:     Head: Normocephalic and atraumatic.     Nose: Nose normal. No congestion.     Mouth/Throat:     Mouth: Mucous membranes are moist.     Pharynx: No posterior oropharyngeal erythema.  Eyes:     General: No scleral icterus.    Extraocular Movements: Extraocular movements intact.  Cardiovascular:     Rate and Rhythm: Normal rate and regular rhythm.     Heart sounds: Normal heart sounds. No murmur heard. Pulmonary:     Breath sounds: Normal breath sounds. No wheezing or rales.  Musculoskeletal:     Cervical back: Neck supple. No tenderness.     Left hip: No tenderness. Decreased range of motion.     Right lower leg: Edema (Mild) present.     Left lower leg: Edema (Mild) present.  Skin:    General: Skin is warm.     Findings: No rash.  Neurological:     General: No focal deficit present.     Mental Status: She is alert and oriented to person, place, and time.     Sensory: No sensory deficit.     Motor: Weakness (Left UE and LE, muscle strength 3/5) present.     Gait: Gait abnormal.  Psychiatric:        Mood and Affect: Mood normal.        Behavior: Behavior normal.     BP 137/85   Pulse 81   Ht 5' 8 (1.727 m)   Wt 130 lb (59 kg)   LMP 05/14/2013   SpO2 100%   BMI 19.77 kg/m  Wt Readings from Last 3 Encounters:  05/09/23 130 lb (59  kg)  01/19/23 137 lb (62.1 kg)  11/04/21 126 lb (57.2 kg)    Lab Results  Component Value Date   TSH 1.710 11/16/2022   Lab Results  Component Value Date   WBC 7.9 05/09/2023   HGB 13.2 05/09/2023   HCT 37.9 05/09/2023   MCV 96 05/09/2023   PLT 143 (L) 05/09/2023   Lab Results  Component Value Date   NA 145 (H) 05/09/2023   K 2.9 (L) 05/09/2023   CO2 31 (H) 05/09/2023   GLUCOSE 100 (H) 05/09/2023   BUN 6 05/09/2023   CREATININE 0.74 05/09/2023   BILITOT 0.7 11/16/2022   ALKPHOS 57 11/16/2022   AST 17 11/16/2022   ALT 7 11/16/2022   PROT 7.6 11/16/2022   ALBUMIN 3.7 (L) 11/16/2022   CALCIUM  9.2 05/09/2023   ANIONGAP 11 04/11/2021   EGFR 94 05/09/2023   Lab Results  Component Value Date   CHOL 140 11/16/2022   Lab Results  Component Value Date   HDL 68 11/16/2022   Lab Results  Component Value Date   LDLCALC 60 11/16/2022   Lab Results  Component Value Date   TRIG 53 11/16/2022   Lab Results  Component Value Date   CHOLHDL 2.1 11/16/2022   Lab Results  Component Value Date   HGBA1C 5.0 11/16/2022      Assessment & Plan:   Problem List Items Addressed This Visit       Cardiovascular and Mediastinum   Essential hypertension - Primary   BP Readings from Last 1 Encounters:  05/09/23 137/85   Well-controlled with Lisinopril  40 mg and amlodipine  5 mg QD Counseled for compliance with the medications Advised DASH diet and moderate exercise/walking as tolerated      Relevant  Medications   amLODipine  (NORVASC ) 5 MG tablet   atorvastatin  (LIPITOR) 20 MG tablet   lisinopril  (ZESTRIL ) 40 MG tablet   Other Relevant Orders   Basic Metabolic Panel (BMET) (Completed)   Phenytoin  level, free and total (Completed)   CBC with Differential/Platelet (Completed)     Musculoskeletal and Integument   Hip swelling, left   She reports left-sided hip swelling, although nontender Could be dependent edema due to chronic bedridden and wheelchair-bound  condition Check x-ray of left hip      Relevant Orders   DG Pelvis 1-2 Views     Other   Seizures (HCC) (Chronic)   On Phenytoin  300 mg once daily, needs to be compliant Has not seen Neurology yet Referred to Neurology      Relevant Medications   phenytoin  (DILANTIN ) 100 MG ER capsule   Other Relevant Orders   Phenytoin  level, free and total (Completed)   CBC with Differential/Platelet (Completed)   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)   Has residual left-sided UE and LE weakness Compliance with antiepileptic emphasized On phenytoin , refilled for now Referred to neurology      Relevant Medications   phenytoin  (DILANTIN ) 100 MG ER capsule   Other Relevant Orders   Phenytoin  level, free and total (Completed)   CBC with Differential/Platelet (Completed)   Mixed hyperlipidemia (Chronic)   Continue Lipitor Checked lipid profile      Relevant Medications   amLODipine  (NORVASC ) 5 MG tablet   atorvastatin  (LIPITOR) 20 MG tablet   lisinopril  (ZESTRIL ) 40 MG tablet   Primary insomnia (Chronic)   On Trazodone  Sleep hygiene material provided      Relevant Medications   traZODone  (DESYREL ) 50 MG tablet   Alcohol dependence with unspecified alcohol-induced disorder (HCC)   States that she does not binge drink now Needs to take multivitamin as she has macrocytic anemia Advised to avoid alcoholic beverages      Other Visit Diagnoses       Colon cancer screening       Relevant Orders   Ambulatory referral to Gastroenterology       Meds ordered this encounter  Medications   amLODipine  (NORVASC ) 5 MG tablet    Sig: Take 1 tablet (5 mg total) by mouth daily.    Dispense:  90 tablet    Refill:  1   atorvastatin  (LIPITOR) 20 MG tablet    Sig: Take 1 tablet (20 mg total) by mouth daily.    Dispense:  90 tablet    Refill:  3   lisinopril  (ZESTRIL ) 40 MG tablet    Sig: Take 1 tablet (40  mg total) by mouth daily.    Dispense:  90 tablet    Refill:  1   phenytoin  (DILANTIN ) 100 MG ER capsule    Sig: Take 3 capsules (300 mg total) by mouth daily.    Dispense:  270 capsule    Refill:  1   traZODone  (DESYREL ) 50 MG tablet    Sig: Take 0.5-1 tablets (25-50 mg total) by mouth at bedtime as needed for sleep.    Dispense:  30 tablet    Refill:  3    Follow-up: Return in about 6 months (around 11/06/2023).    Suzzane MARLA Blanch, MD

## 2023-05-10 ENCOUNTER — Other Ambulatory Visit: Payer: Self-pay | Admitting: Internal Medicine

## 2023-05-10 DIAGNOSIS — E876 Hypokalemia: Secondary | ICD-10-CM

## 2023-05-10 MED ORDER — POTASSIUM CHLORIDE CRYS ER 20 MEQ PO TBCR: 20.0000 meq | EXTENDED_RELEASE_TABLET | Freq: Every day | ORAL | 0 refills | Status: DC

## 2023-05-11 ENCOUNTER — Encounter (INDEPENDENT_AMBULATORY_CARE_PROVIDER_SITE_OTHER): Payer: Self-pay | Admitting: *Deleted

## 2023-05-11 LAB — CBC WITH DIFFERENTIAL/PLATELET
Basophils Absolute: 0 10*3/uL (ref 0.0–0.2)
Basos: 1 %
EOS (ABSOLUTE): 0.2 10*3/uL (ref 0.0–0.4)
Eos: 2 %
Hematocrit: 37.9 % (ref 34.0–46.6)
Hemoglobin: 13.2 g/dL (ref 11.1–15.9)
Immature Grans (Abs): 0 10*3/uL (ref 0.0–0.1)
Immature Granulocytes: 0 %
Lymphocytes Absolute: 2.3 10*3/uL (ref 0.7–3.1)
Lymphs: 29 %
MCH: 33.4 pg — ABNORMAL HIGH (ref 26.6–33.0)
MCHC: 34.8 g/dL (ref 31.5–35.7)
MCV: 96 fL (ref 79–97)
Monocytes Absolute: 0.9 10*3/uL (ref 0.1–0.9)
Monocytes: 11 %
Neutrophils Absolute: 4.6 10*3/uL (ref 1.4–7.0)
Neutrophils: 57 %
Platelets: 143 10*3/uL — ABNORMAL LOW (ref 150–450)
RBC: 3.95 x10E6/uL (ref 3.77–5.28)
RDW: 11.8 % (ref 11.7–15.4)
WBC: 7.9 10*3/uL (ref 3.4–10.8)

## 2023-05-11 LAB — BASIC METABOLIC PANEL
BUN/Creatinine Ratio: 8 — ABNORMAL LOW (ref 9–23)
BUN: 6 mg/dL (ref 6–24)
CO2: 31 mmol/L — ABNORMAL HIGH (ref 20–29)
Calcium: 9.2 mg/dL (ref 8.7–10.2)
Chloride: 99 mmol/L (ref 96–106)
Creatinine, Ser: 0.74 mg/dL (ref 0.57–1.00)
Glucose: 100 mg/dL — ABNORMAL HIGH (ref 70–99)
Potassium: 2.9 mmol/L — ABNORMAL LOW (ref 3.5–5.2)
Sodium: 145 mmol/L — ABNORMAL HIGH (ref 134–144)
eGFR: 94 mL/min/{1.73_m2} (ref >59)

## 2023-05-11 LAB — PHENYTOIN LEVEL, FREE AND TOTAL: Phenytoin, Serum: 4 ug/mL — ABNORMAL LOW (ref 10.0–20.0)

## 2023-05-12 DIAGNOSIS — M25452 Effusion, left hip: Secondary | ICD-10-CM | POA: Insufficient documentation

## 2023-05-12 NOTE — Assessment & Plan Note (Signed)
On Trazodone Sleep hygiene material provided

## 2023-05-12 NOTE — Assessment & Plan Note (Signed)
On Phenytoin 300 mg once daily, needs to be compliant Has not seen Neurology yet Referred to Neurology

## 2023-05-12 NOTE — Assessment & Plan Note (Signed)
Continue Lipitor Checked lipid profile

## 2023-05-12 NOTE — Assessment & Plan Note (Signed)
BP Readings from Last 1 Encounters:  05/09/23 137/85   Well-controlled with Lisinopril 40 mg and amlodipine 5 mg QD Counseled for compliance with the medications Advised DASH diet and moderate exercise/walking as tolerated

## 2023-05-12 NOTE — Assessment & Plan Note (Signed)
She reports left-sided hip swelling, although nontender Could be dependent edema due to chronic bedridden and wheelchair-bound condition Check x-ray of left hip

## 2023-05-12 NOTE — Assessment & Plan Note (Signed)
Has residual left-sided UE and LE weakness Compliance with antiepileptic emphasized On phenytoin, refilled for now Referred to neurology 

## 2023-05-12 NOTE — Assessment & Plan Note (Signed)
States that she does not binge drink now Needs to take multivitamin as she has macrocytic anemia Advised to avoid alcoholic beverages

## 2023-09-01 ENCOUNTER — Inpatient Hospital Stay (HOSPITAL_COMMUNITY)

## 2023-09-01 ENCOUNTER — Inpatient Hospital Stay (HOSPITAL_COMMUNITY)
Admission: EM | Admit: 2023-09-01 | Discharge: 2023-09-04 | DRG: 641 | Disposition: A | Discharge status: Home Health | Attending: Internal Medicine | Admitting: Internal Medicine

## 2023-09-01 ENCOUNTER — Emergency Department (HOSPITAL_COMMUNITY)

## 2023-09-01 ENCOUNTER — Other Ambulatory Visit: Payer: Self-pay

## 2023-09-01 ENCOUNTER — Encounter (HOSPITAL_COMMUNITY): Payer: Self-pay | Admitting: Emergency Medicine

## 2023-09-01 DIAGNOSIS — R55 Syncope and collapse: Secondary | ICD-10-CM | POA: Diagnosis not present

## 2023-09-01 DIAGNOSIS — G4700 Insomnia, unspecified: Secondary | ICD-10-CM | POA: Diagnosis present

## 2023-09-01 DIAGNOSIS — E860 Dehydration: Secondary | ICD-10-CM | POA: Diagnosis present

## 2023-09-01 DIAGNOSIS — Z515 Encounter for palliative care: Secondary | ICD-10-CM

## 2023-09-01 DIAGNOSIS — K529 Noninfective gastroenteritis and colitis, unspecified: Secondary | ICD-10-CM | POA: Diagnosis present

## 2023-09-01 DIAGNOSIS — E538 Deficiency of other specified B group vitamins: Secondary | ICD-10-CM | POA: Diagnosis present

## 2023-09-01 DIAGNOSIS — E785 Hyperlipidemia, unspecified: Secondary | ICD-10-CM | POA: Diagnosis present

## 2023-09-01 DIAGNOSIS — I69354 Hemiplegia and hemiparesis following cerebral infarction affecting left non-dominant side: Secondary | ICD-10-CM

## 2023-09-01 DIAGNOSIS — R7989 Other specified abnormal findings of blood chemistry: Secondary | ICD-10-CM

## 2023-09-01 DIAGNOSIS — G40909 Epilepsy, unspecified, not intractable, without status epilepticus: Secondary | ICD-10-CM | POA: Diagnosis present

## 2023-09-01 DIAGNOSIS — K766 Portal hypertension: Secondary | ICD-10-CM | POA: Diagnosis present

## 2023-09-01 DIAGNOSIS — Z7189 Other specified counseling: Secondary | ICD-10-CM

## 2023-09-01 DIAGNOSIS — I471 Supraventricular tachycardia, unspecified: Secondary | ICD-10-CM

## 2023-09-01 DIAGNOSIS — R17 Unspecified jaundice: Secondary | ICD-10-CM

## 2023-09-01 DIAGNOSIS — Z8673 Personal history of transient ischemic attack (TIA), and cerebral infarction without residual deficits: Secondary | ICD-10-CM | POA: Diagnosis not present

## 2023-09-01 DIAGNOSIS — I2489 Other forms of acute ischemic heart disease: Secondary | ICD-10-CM | POA: Diagnosis present

## 2023-09-01 DIAGNOSIS — K219 Gastro-esophageal reflux disease without esophagitis: Secondary | ICD-10-CM | POA: Diagnosis present

## 2023-09-01 DIAGNOSIS — R4701 Aphasia: Secondary | ICD-10-CM | POA: Diagnosis present

## 2023-09-01 DIAGNOSIS — I10 Essential (primary) hypertension: Secondary | ICD-10-CM

## 2023-09-01 DIAGNOSIS — I4719 Other supraventricular tachycardia: Secondary | ICD-10-CM | POA: Diagnosis present

## 2023-09-01 DIAGNOSIS — Z79899 Other long term (current) drug therapy: Secondary | ICD-10-CM

## 2023-09-01 DIAGNOSIS — E876 Hypokalemia: Principal | ICD-10-CM

## 2023-09-01 DIAGNOSIS — R739 Hyperglycemia, unspecified: Secondary | ICD-10-CM

## 2023-09-01 LAB — TROPONIN I (HIGH SENSITIVITY)
Troponin I (High Sensitivity): 49 ng/L — ABNORMAL HIGH (ref <18)
Troponin I (High Sensitivity): 280 ng/L (ref <18)
Troponin I (High Sensitivity): 358 ng/L (ref <18)
Troponin I (High Sensitivity): 359 ng/L (ref <18)

## 2023-09-01 LAB — HEPATIC FUNCTION PANEL
ALT: 12 U/L (ref 0–44)
AST: 39 U/L (ref 15–41)
Albumin: 2.3 g/dL — ABNORMAL LOW (ref 3.5–5.0)
Alkaline Phosphatase: 41 U/L (ref 38–126)
Bilirubin, Direct: 1.1 mg/dL — ABNORMAL HIGH (ref 0.0–0.2)
Indirect Bilirubin: 2.3 mg/dL — ABNORMAL HIGH (ref 0.3–0.9)
Total Bilirubin: 3.4 mg/dL — ABNORMAL HIGH (ref 0.0–1.2)
Total Protein: 7 g/dL (ref 6.5–8.1)

## 2023-09-01 LAB — ECHOCARDIOGRAM COMPLETE
AR max vel: 1.74 cm2
AV Area VTI: 1.68 cm2
AV Area mean vel: 1.58 cm2
AV Mean grad: 2 mmHg
AV Peak grad: 3.3 mmHg
Ao pk vel: 0.9 m/s
Area-P 1/2: 3.5 cm2
Height: 68 in
S' Lateral: 2.8 cm
Weight: 2074.09 [oz_av]

## 2023-09-01 LAB — BASIC METABOLIC PANEL WITH GFR
Anion gap: 16 — ABNORMAL HIGH (ref 5–15)
Anion gap: 8 (ref 5–15)
BUN: 6 mg/dL (ref 6–20)
BUN: 5 mg/dL — ABNORMAL LOW (ref 6–20)
CO2: 31 mmol/L (ref 22–32)
CO2: 30 mmol/L (ref 22–32)
Calcium: 8.1 mg/dL — ABNORMAL LOW (ref 8.9–10.3)
Calcium: 7.9 mg/dL — ABNORMAL LOW (ref 8.9–10.3)
Chloride: 89 mmol/L — ABNORMAL LOW (ref 98–111)
Chloride: 96 mmol/L — ABNORMAL LOW (ref 98–111)
Creatinine, Ser: 0.88 mg/dL (ref 0.44–1.00)
Creatinine, Ser: 0.67 mg/dL (ref 0.44–1.00)
GFR, Estimated: >60 mL/min (ref >60)
GFR, Estimated: >60 mL/min (ref >60)
Glucose, Bld: 110 mg/dL — ABNORMAL HIGH (ref 70–99)
Glucose, Bld: 130 mg/dL — ABNORMAL HIGH (ref 70–99)
Potassium: <2 mmol/L — CL (ref 3.5–5.1)
Potassium: <2 mmol/L — CL (ref 3.5–5.1)
Sodium: 136 mmol/L (ref 135–145)
Sodium: 134 mmol/L — ABNORMAL LOW (ref 135–145)

## 2023-09-01 LAB — CBC WITH DIFFERENTIAL/PLATELET
Abs Immature Granulocytes: 0.07 10*3/uL (ref 0.00–0.07)
Basophils Absolute: 0.1 10*3/uL (ref 0.0–0.1)
Basophils Relative: 1 %
Eosinophils Absolute: 0.1 10*3/uL (ref 0.0–0.5)
Eosinophils Relative: 1 %
HCT: 36.1 % (ref 36.0–46.0)
Hemoglobin: 12.2 g/dL (ref 12.0–15.0)
Immature Granulocytes: 0 %
Lymphocytes Relative: 21 %
Lymphs Abs: 3.6 10*3/uL (ref 0.7–4.0)
MCH: 33.9 pg (ref 26.0–34.0)
MCHC: 33.8 g/dL (ref 30.0–36.0)
MCV: 100.3 fL — ABNORMAL HIGH (ref 80.0–100.0)
Monocytes Absolute: 1.9 10*3/uL — ABNORMAL HIGH (ref 0.1–1.0)
Monocytes Relative: 11 %
Neutro Abs: 11.3 10*3/uL — ABNORMAL HIGH (ref 1.7–7.7)
Neutrophils Relative %: 66 %
Platelets: 182 10*3/uL (ref 150–400)
RBC: 3.6 MIL/uL — ABNORMAL LOW (ref 3.87–5.11)
RDW: 14.5 % (ref 11.5–15.5)
WBC: 17.1 10*3/uL — ABNORMAL HIGH (ref 4.0–10.5)
nRBC: 0 % (ref 0.0–0.2)

## 2023-09-01 LAB — HIV ANTIBODY (ROUTINE TESTING W REFLEX): HIV Screen 4th Generation wRfx: NONREACTIVE

## 2023-09-01 LAB — LIPASE, BLOOD: Lipase: 21 U/L (ref 11–51)

## 2023-09-01 LAB — MAGNESIUM
Magnesium: 1.5 mg/dL — ABNORMAL LOW (ref 1.7–2.4)
Magnesium: 2.1 mg/dL (ref 1.7–2.4)

## 2023-09-01 LAB — TSH: TSH: 1.128 u[IU]/mL (ref 0.350–4.500)

## 2023-09-01 LAB — MRSA NEXT GEN BY PCR, NASAL: MRSA by PCR Next Gen: NOT DETECTED

## 2023-09-01 LAB — CBG MONITORING, ED: Glucose-Capillary: 111 mg/dL — ABNORMAL HIGH (ref 70–99)

## 2023-09-01 LAB — PHENYTOIN LEVEL, TOTAL: Phenytoin Lvl: <2.5 ug/mL — ABNORMAL LOW (ref 10.0–20.0)

## 2023-09-01 MED ORDER — FERROUS SULFATE 325 (65 FE) MG PO TABS
325.0000 mg | ORAL_TABLET | Freq: Every day | ORAL | Status: DC
  Administered 2023-09-01 – 2023-09-04 (×4): 325 mg via ORAL
  Filled 2023-09-01 (×4): qty 1

## 2023-09-01 MED ORDER — SODIUM CHLORIDE 0.9 % IV SOLN: INTRAVENOUS | Status: AC

## 2023-09-01 MED ORDER — ADENOSINE 6 MG/2ML IV SOLN
INTRAVENOUS | Status: AC
  Filled 2023-09-01: qty 4

## 2023-09-01 MED ORDER — METRONIDAZOLE 500 MG/100ML IV SOLN
500.0000 mg | Freq: Once | INTRAVENOUS | Status: AC
  Administered 2023-09-01: 500 mg via INTRAVENOUS
  Filled 2023-09-01: qty 100

## 2023-09-01 MED ORDER — CHLORHEXIDINE GLUCONATE CLOTH 2 % EX PADS
6.0000 | MEDICATED_PAD | Freq: Every day | CUTANEOUS | Status: DC
  Administered 2023-09-01 – 2023-09-04 (×4): 6 via TOPICAL

## 2023-09-01 MED ORDER — POTASSIUM CHLORIDE CRYS ER 20 MEQ PO TBCR
40.0000 meq | EXTENDED_RELEASE_TABLET | ORAL | Status: AC
Start: 2023-09-01 — End: 2023-09-02
  Administered 2023-09-01 – 2023-09-02 (×3): 40 meq via ORAL
  Filled 2023-09-01 (×3): qty 2

## 2023-09-01 MED ORDER — HYDROXYZINE HCL 25 MG PO TABS
25.0000 mg | ORAL_TABLET | Freq: Two times a day (BID) | ORAL | Status: DC | PRN
Start: 2023-09-01 — End: 2023-09-04

## 2023-09-01 MED ORDER — ASPIRIN 300 MG RE SUPP
300.0000 mg | Freq: Once | RECTAL | Status: AC
  Administered 2023-09-01: 300 mg via RECTAL
  Filled 2023-09-01: qty 1

## 2023-09-01 MED ORDER — ONDANSETRON HCL 4 MG PO TABS: 4.0000 mg | ORAL_TABLET | Freq: Four times a day (QID) | ORAL | Status: DC | PRN

## 2023-09-01 MED ORDER — SODIUM CHLORIDE 0.9 % IV SOLN: INTRAVENOUS | Status: DC

## 2023-09-01 MED ORDER — SODIUM CHLORIDE 0.9 % IV BOLUS
1000.0000 mL | Freq: Once | INTRAVENOUS | Status: AC
  Administered 2023-09-01: 1000 mL via INTRAVENOUS

## 2023-09-01 MED ORDER — POTASSIUM CHLORIDE 10 MEQ/100ML IV SOLN
10.0000 meq | INTRAVENOUS | Status: AC
  Administered 2023-09-01 (×6): 10 meq via INTRAVENOUS
  Filled 2023-09-01 (×6): qty 100

## 2023-09-01 MED ORDER — ONDANSETRON HCL 4 MG/2ML IJ SOLN: 4.0000 mg | Freq: Four times a day (QID) | INTRAMUSCULAR | Status: DC | PRN

## 2023-09-01 MED ORDER — MAGNESIUM SULFATE 2 GM/50ML IV SOLN
2.0000 g | Freq: Once | INTRAVENOUS | Status: AC
  Administered 2023-09-01: 2 g via INTRAVENOUS
  Filled 2023-09-01: qty 50

## 2023-09-01 MED ORDER — PHENYTOIN SODIUM EXTENDED 100 MG PO CAPS
300.0000 mg | ORAL_CAPSULE | Freq: Every day | ORAL | Status: DC
  Administered 2023-09-01 – 2023-09-04 (×4): 300 mg via ORAL
  Filled 2023-09-01 (×4): qty 3

## 2023-09-01 MED ORDER — ADENOSINE 6 MG/2ML IV SOLN: 12.0000 mg | Freq: Once | INTRAVENOUS | Status: AC

## 2023-09-01 MED ORDER — ATORVASTATIN CALCIUM 20 MG PO TABS
20.0000 mg | ORAL_TABLET | Freq: Every day | ORAL | Status: DC
Start: 2023-09-01 — End: 2023-09-04
  Administered 2023-09-01 – 2023-09-04 (×4): 20 mg via ORAL
  Filled 2023-09-01 (×4): qty 1

## 2023-09-01 MED ORDER — TRAZODONE HCL 50 MG PO TABS
25.0000 mg | ORAL_TABLET | Freq: Every evening | ORAL | Status: DC | PRN
  Administered 2023-09-02: 50 mg via ORAL
  Filled 2023-09-01: qty 1

## 2023-09-01 MED ORDER — LORAZEPAM 2 MG/ML IJ SOLN
0.5000 mg | Freq: Once | INTRAMUSCULAR | Status: AC
  Administered 2023-09-01: 0.5 mg via INTRAVENOUS
  Filled 2023-09-01: qty 1

## 2023-09-01 MED ORDER — POTASSIUM CHLORIDE CRYS ER 20 MEQ PO TBCR
40.0000 meq | EXTENDED_RELEASE_TABLET | Freq: Once | ORAL | Status: DC
  Filled 2023-09-01: qty 2

## 2023-09-01 MED ORDER — ADENOSINE 6 MG/2ML IV SOLN
INTRAVENOUS | Status: AC
Start: 2023-09-01 — End: 2023-09-01
  Administered 2023-09-01: 12 mg via INTRAVENOUS
  Filled 2023-09-01: qty 2

## 2023-09-01 MED ORDER — METOPROLOL TARTRATE 5 MG/5ML IV SOLN
5.0000 mg | Freq: Once | INTRAVENOUS | Status: AC
  Administered 2023-09-01: 5 mg via INTRAVENOUS

## 2023-09-01 MED ORDER — POTASSIUM CHLORIDE IN NACL 40-0.9 MEQ/L-% IV SOLN
INTRAVENOUS | Status: AC
  Filled 2023-09-01 (×2): qty 1000

## 2023-09-01 MED ORDER — ADENOSINE 6 MG/2ML IV SOLN
6.0000 mg | Freq: Once | INTRAVENOUS | Status: AC
  Administered 2023-09-01: 6 mg via INTRAVENOUS

## 2023-09-01 MED ORDER — SODIUM CHLORIDE 0.9 % IV BOLUS
500.0000 mL | Freq: Once | INTRAVENOUS | Status: AC
  Administered 2023-09-01: 500 mL via INTRAVENOUS

## 2023-09-01 MED ORDER — METOPROLOL TARTRATE 5 MG/5ML IV SOLN
INTRAVENOUS | Status: AC
Start: 2023-09-01 — End: 2023-09-01
  Filled 2023-09-01: qty 5

## 2023-09-01 MED ORDER — ACETAMINOPHEN 650 MG RE SUPP: 650.0000 mg | Freq: Four times a day (QID) | RECTAL | Status: DC | PRN

## 2023-09-01 MED ORDER — ACETAMINOPHEN 325 MG PO TABS: 650.0000 mg | ORAL_TABLET | Freq: Four times a day (QID) | ORAL | Status: DC | PRN

## 2023-09-01 MED ORDER — ENOXAPARIN SODIUM 40 MG/0.4ML IJ SOSY
40.0000 mg | PREFILLED_SYRINGE | INTRAMUSCULAR | Status: DC
  Administered 2023-09-01 – 2023-09-03 (×3): 40 mg via SUBCUTANEOUS
  Filled 2023-09-01 (×3): qty 0.4

## 2023-09-01 MED ORDER — IOHEXOL 300 MG/ML  SOLN
100.0000 mL | Freq: Once | INTRAMUSCULAR | Status: AC | PRN
  Administered 2023-09-01: 100 mL via INTRAVENOUS

## 2023-09-01 MED ORDER — ACETAMINOPHEN 325 MG PO TABS
650.0000 mg | ORAL_TABLET | Freq: Four times a day (QID) | ORAL | Status: DC | PRN
  Administered 2023-09-03: 650 mg via ORAL
  Filled 2023-09-01: qty 2

## 2023-09-01 MED ORDER — PANTOPRAZOLE SODIUM 40 MG PO TBEC
40.0000 mg | DELAYED_RELEASE_TABLET | Freq: Every day | ORAL | Status: DC
  Administered 2023-09-01 – 2023-09-04 (×4): 40 mg via ORAL
  Filled 2023-09-01 (×4): qty 1

## 2023-09-01 MED ORDER — CIPROFLOXACIN IN D5W 400 MG/200ML IV SOLN
400.0000 mg | Freq: Once | INTRAVENOUS | Status: AC
  Administered 2023-09-01: 400 mg via INTRAVENOUS
  Filled 2023-09-01: qty 200

## 2023-09-01 NOTE — ED Notes (Signed)
 Patient transported to CT

## 2023-09-01 NOTE — ED Notes (Signed)
 PT pull-up wet Pt changed, brief and PurWick placed Pt attempted to bite staff, RN and NT while trying to clean and dress

## 2023-09-01 NOTE — ED Notes (Signed)
 Dr Joel Murphy aware of vs and bladder scan of . Orders to watch pt and recheck bladder scan in 2 hours. Will give in report.

## 2023-09-01 NOTE — ED Notes (Signed)
 Pt appears irritable after daughter came to bedside. Pt repeating "come on" and attempting to sit up. Pt appears very restless and repetitively yells out. Pt not easily consolable or reassured. Pt repositioned and educated on plan of care while she is in the hospital. Daughter states she is going to leave, contact info left with this RN.

## 2023-09-01 NOTE — ED Triage Notes (Signed)
 Pt arrives via EMS from home with near syncopal episode while using the bathroom. Pt denies CP/SHOB. EMS report HR 160s upon arrival. Hx stroke and L sided defecits.

## 2023-09-01 NOTE — ED Notes (Signed)
 Received report from [facility]. Pt resting. Pt responds to voice. Gen weakness noted. Color wnl. No resp distress or shob noted. Pt has not urinated since been in ed.Bladder scanned and showed . Will notify Dr Sunnie England. Pt poor hygiene.

## 2023-09-01 NOTE — ED Notes (Signed)
 Dr Sunnie England paged

## 2023-09-01 NOTE — H&P (Signed)
 History and Physical    Patient: Cynthia Mclean:[id number] DOB: 09-20-1965 DOA: 09/01/2023 DOS: the patient was seen and examined on 09/01/2023 PCP: Meldon Sport, MD   Patient coming from: Home  Chief Complaint:  Chief Complaint  Patient presents with   Near Syncope   HPI: Cynthia Mclean is a 58 y.o. female with medical history significant of hypertension, seizure disorder, prior history of stroke with left hemiparesis, hyperlipidemia and GERd; who presented to ED with concerns for general malaise and near syncope event.  Symptoms presented sudden and roughly 12 hours prior to admission.  There have been complaints of decreased oral intake without specific fevers, shortness of breath, chest pain, abdominal pain, dysuria/hematuria, diarrhea, melena/hematochezia associated nausea vomiting or any other complaints.  Workup in the ED demonstrating severe hypokalemia, leukocytosis, hypomagnesemia mildly elevated troponin and a CT scan of the abdomen with concerns for generalized enterocolitis.  Fluid resuscitation and empiric antibiotics started.  Patient was found to have some SVT treated with adenosine x 2; TRH consulted to place patient in the hospital for further evaluation and management   Review of Systems: As mentioned in the history of present illness. All other systems reviewed and are negative. Past Medical History:  Diagnosis Date   Alcohol dependency (HCC)    HELICOBACTER PYLORI [H. PYLORI] INFECTION 02/24/2010   Qualifier: Diagnosis of  By: Artelia Laroche.    Hypertension    IDA (iron  deficiency anemia)    required blood tranfusion   Nicotine addiction    Seizure disorder [facility])    Past Surgical History:  Procedure Laterality Date   btl     left knee surgery, fracture     Social History:  reports that she has never smoked. Her smokeless tobacco use includes snuff. She reports that she does not currently use alcohol. She reports that she does not use drugs.  No Known  Allergies  Family History  Problem Relation Age of Onset   Anxiety disorder Sister     Prior to Admission medications   Medication Sig Start Date End Date Taking? Authorizing Provider  acetaminophen  (TYLENOL ) 325 MG tablet Take 2 tablets (650 mg total) by mouth every 4 (four) hours as needed for mild pain (or temp >/= 99.5). 03/13/20  Yes Emokpae, Courage, MD  amLODipine  (NORVASC ) 5 MG tablet Take 1 tablet (5 mg total) by mouth daily. 05/09/23  Yes Meldon Sport, MD  lisinopril  (ZESTRIL ) 40 MG tablet Take 1 tablet (40 mg total) by mouth daily. 05/09/23  Yes Meldon Sport, MD  phenytoin  (DILANTIN ) 100 MG ER capsule Take 3 capsules (300 mg total) by mouth daily. Patient taking differently: Take 200 mg by mouth daily. 05/09/23  Yes Meldon Sport, MD  traZODone  (DESYREL ) 50 MG tablet Take 0.5-1 tablets (25-50 mg total) by mouth at bedtime as needed for sleep. 05/09/23  Yes Meldon Sport, MD  atorvastatin  (LIPITOR) 20 MG tablet Take 1 tablet (20 mg total) by mouth daily. Patient not taking: Reported on 09/01/2023 05/09/23   Meldon Sport, MD  hydrOXYzine  (ATARAX ) 25 MG tablet Take 1 tablet (25 mg total) by mouth 2 (two) times daily as needed for anxiety. Patient not taking: Reported on 09/01/2023 08/25/21   Meldon Sport, MD  Iron , Ferrous Sulfate , 325 (65 Fe) MG TABS Take 325 mg by mouth daily. Patient not taking: Reported on 09/01/2023 08/25/21   Meldon Sport, MD  UNABLE TO FIND Med Name: Adult diapers,Pads,Wipes. DX:R32 11/24/21  Meldon Sport, MD    Physical Exam: Vitals:   09/01/23 1620 09/01/23 1649 09/01/23 1700 09/01/23 1800  BP:  (!) 102/52 91/63 (!) 114/51  Pulse:      Resp:  13 14 14   Temp: (!) 97.4 F (36.3 C)     TempSrc: Axillary     SpO2:      Weight:      Height:       General exam: Alert, awake, able to follow commands intermittently; in no major distress.  Reports not to be hungry. Respiratory system: Good saturation on room air. Cardiovascular system:RRR. No rubs  or gallops; no JVD. Gastrointestinal system: Abdomen is nondistended, soft and nontender. No organomegaly or masses felt. Normal bowel sounds heard. Central nervous system: No new focal neurological deficits. Extremities: No cyanosis or clubbing. Skin: No petechiae. Psychiatry: Flat affect appreciated on exam.  Data Reviewed: Basic metabolic panel: Sodium 136, potassium less than 2,, bicarb 31, BUN 6, creatinine 0.88 and GFR >60 Patient's anion gap 16 CBC: WBC 17.1, hemoglobin 12.2, MCV 100.3 and platelet count 182K Phenytoin  level less than 2.5 Troponin: 49 >> 280 Magnesium : 1.5 Lipase: 21   Assessment and Plan: 1-severe hypokalemia and hypomagnesemia - Most likely associated with decreased oral intake and dehydration - Will provide fluid resuscitation, electrolyte repletion - Continue telemetry monitoring and follow clinical response.  2-elevated troponin - No chest pain, shortness of breath or acute ischemic changes on telemetry or EKG - Will replete electrolytes - At the moment holding on heparin drip and continue to follow troponin trend - 2D echo has been ordered.  3-hyperlipidemia - Continue Lipitor  4-GERD - Continue Protonix  5-history of seizure disorder - Continue phenytoin   6-prior history of stroke -No new focal deficit appreciated - Continue risk factor modification - Continue statin - Patient was not actively using aspirin or Plavix as an outpatient prior to admission.  7-enterocolitis - No nausea, no vomiting, no abdominal pain - Empiric: Antibiotics were provided in the ED - In the setting of no acute infection or even presence of diarrhea we will hold further antibiotics for now and provide supportive care and fluid resuscitation only. - Full liquid diet will be allowed-will follow response to further advance as tolerated.  8-history of insomnia - Continue as needed trazodone  at bedtime  9-social/ethics - Palliative care consultation requested  for goals of care and advance care planning - Patient with decreased quality after her stroke with residual hemiparesis    Advance Care Planning:   Code Status: Full Code   Consults: Cardiology service curbside; palliative care consultation requested  Family Communication: No family at bedside.  Severity of Illness: The appropriate patient status for this patient is INPATIENT. Inpatient status is judged to be reasonable and necessary in order to provide the required intensity of service to ensure the patient's safety. The patient's presenting symptoms, physical exam findings, and initial radiographic and laboratory data in the context of their chronic comorbidities is felt to place them at high risk for further clinical deterioration. Furthermore, it is not anticipated that the patient will be medically stable for discharge from the hospital within 2 midnights of admission.   * I certify that at the point of admission it is my clinical judgment that the patient will require inpatient hospital care spanning beyond 2 midnights from the point of admission due to high intensity of service, high risk for further deterioration and high frequency of surveillance required.*  Author: Justina Oman, MD 09/01/2023 7:20  PM  For on call review [email or web address].

## 2023-09-01 NOTE — ED Provider Notes (Signed)
 [place] EMERGENCY DEPARTMENT AT [facility] Provider Note   CSN: [id number] Arrival date & time: 09/01/23  0210     History  Chief Complaint  Patient presents with   Near Syncope    Cynthia Mclean is a 58 y.o. female.  The history is provided by a relative. The history is limited by the condition of the patient (Aphasia).  Near Syncope  She has history of hypertension, hyperlipidemia, seizure disorder, stroke with left hemiparesis and came by ambulance because she was uncomfortable at home.  Family member said that she just could not seem to find a position that she would stay in.  There is no known fever.  There was no vomiting or diarrhea.   Home Medications Prior to Admission medications   Medication Sig Start Date End Date Taking? Authorizing Provider  acetaminophen  (TYLENOL ) 325 MG tablet Take 2 tablets (650 mg total) by mouth every 4 (four) hours as needed for mild pain (or temp >/= 99.5). 03/13/20   Colin Dawley, MD  amLODipine  (NORVASC ) 5 MG tablet Take 1 tablet (5 mg total) by mouth daily. 05/09/23   Meldon Sport, MD  atorvastatin  (LIPITOR) 20 MG tablet Take 1 tablet (20 mg total) by mouth daily. 05/09/23   Patel, Rutwik K, MD  Famotidine (PEPCID PO) Take 1 tablet by mouth daily as needed (indigestion).    [provider]  hydrOXYzine  (ATARAX ) 25 MG tablet Take 1 tablet (25 mg total) by mouth 2 (two) times daily as needed for anxiety. 08/25/21   Meldon Sport, MD  Iron , Ferrous Sulfate , 325 (65 Fe) MG TABS Take 325 mg by mouth daily. 08/25/21   Meldon Sport, MD  lisinopril  (ZESTRIL ) 40 MG tablet Take 1 tablet (40 mg total) by mouth daily. 05/09/23   Meldon Sport, MD  Misc. Devices (ROLLER Baxter) MISC For walking assistance. 09/28/21   Meldon Sport, MD  Misc. Devices MISC Left hand resting splint 11/13/21   Meldon Sport, MD  Multiple Vitamin (MULTIVITAMIN WITH MINERALS) TABS tablet Take 1 tablet by mouth daily. 03/14/20   Colin Dawley, MD   phenytoin  (DILANTIN ) 100 MG ER capsule Take 3 capsules (300 mg total) by mouth daily. 05/09/23   Meldon Sport, MD  potassium chloride  SA (KLOR-CON  M) 20 MEQ tablet Take 1 tablet (20 mEq total) by mouth daily. 05/10/23   Meldon Sport, MD  traZODone  (DESYREL ) 50 MG tablet Take 0.5-1 tablets (25-50 mg total) by mouth at bedtime as needed for sleep. 05/09/23   Meldon Sport, MD  UNABLE TO FIND Med Name: Adult diapers,Pads,Wipes. DX:R32 11/24/21   Meldon Sport, MD      Allergies    Patient has no known allergies.    Review of Systems   Review of Systems  Unable to perform ROS: Patient nonverbal  Cardiovascular:  Positive for near-syncope.    Physical Exam Updated Vital Signs BP 113/83 (BP Location: Left Arm)   Pulse (!) 163   Temp 97.7 F (36.5 C) (Oral)   Resp 15   Ht 5\' 8"  (1.727 m)   Wt 57.6 kg   LMP 05/14/2013   SpO2 98%   BMI 19.31 kg/m  Physical Exam Vitals and nursing note reviewed.   58 year old female, appears mildly agitated, but is in no acute distress. Vital signs are significant for rapid heart rate. Oxygen saturation is 98%, which is normal. Head is normocephalic and atraumatic. PERRLA, EOMI. . Neck is nontender and  supple without adenopathy. Lungs are clear without rales, wheezes, or rhonchi. Chest is nontender. Heart is tachycardic without murmur. Abdomen is soft, flat, nontender. Extremities have no cyanosis or edema.  Flexion contracture present left arm. Skin is warm and dry without rash. Neurologic: Awake and able to answer questions yes or no but no additional speech.  She has a dense left hemiparesis.  ED Results / Procedures / Treatments   Labs (all labs ordered are listed, but only abnormal results are displayed) Labs Reviewed  CBG MONITORING, ED - Abnormal; Notable for the following components:      Result Value   Glucose-Capillary 111 (*)    All other components within normal limits    EKG EKG Interpretation Date/Time:  Thursday Sep 01 2023 02:18:16 EDT Ventricular Rate:  159 PR Interval:  69 QRS Duration:  81 QT Interval:  249 QTC Calculation: 405 R Axis:   57  Text Interpretation: Supraventricular tachycardia Repolarization abnormality, prob rate related When compared with ECG of 04/11/2021, Supraventricular tachycardia has replaced Sinus rhythm REPOLARIZATION ABNORMALITY is more prominent - probably rate-related Confirmed by Alissa April ([id number]) on 09/01/2023 2:24:40 AM   EKG Interpretation Date/Time:  Thursday Sep 01 2023 02:31:16 EDT Ventricular Rate:  92 PR Interval:  148 QRS Duration:  77 QT Interval:  459 QTC Calculation: 630 R Axis:   64  Text Interpretation: Sinus rhythm Ventricular premature complex Sinus pause with ventricular escape Repol abnrm, severe global ischemia (LM/MVD) Prolonged QT interval When compared with ECG of EARLIER SAME DATE Sinus rhythm has replaced Supraventricular tachycardia REPOLARIZATION ABNORMALITY is less pronounced Confirmed by Alissa April ([id number]) on 09/01/2023 3:17:11 AM        EKG Interpretation Date/Time:  Thursday Sep 01 2023 02:35:47 EDT Ventricular Rate:  164 PR Interval:  114 QRS Duration:  80 QT Interval:  259 QTC Calculation: 428 R Axis:   56  Text Interpretation: Supraventricular tachycardia Repolarization abnormality, prob rate related When compared with ECG of EARLIER SAME DATE Supraventricular tachycardia has replaced Sinus rhythm Confirmed by Alissa April ([id number]) on 09/01/2023 3:18:00 AM        EKG Interpretation Date/Time:  Thursday Sep 01 2023 02:40:04 EDT Ventricular Rate:  91 PR Interval:  114 QRS Duration:  84 QT Interval:  366 QTC Calculation: 451 R Axis:   52  Text Interpretation: Sinus rhythm Repol abnrm, severe global ischemia (LM/MVD) When compared with ECG of EARLIER SAME DATE Sinus rhythm has replaced Supraventricular tachycardia Confirmed by Alissa April ([id number]) on 09/01/2023 3:19:12 AM         Radiology No results  found.  Procedures Procedures  Cardiac monitor shows supraventricular tachycardia, per my interpretation.  Medications Ordered in ED Medications  adenosine (ADENOCARD) 6 MG/2ML injection 6 mg ( Intravenous Canceled Entry 09/01/23 0239)  adenosine (ADENOCARD) 6 MG/2ML injection 12 mg (12 mg Intravenous Given 09/01/23 0233)  metoprolol tartrate (LOPRESSOR) injection 5 mg ( Intravenous Canceled Entry 09/01/23 0241)    ED Course/ Medical Decision Making/ A&P                                 Medical Decision Making Amount and/or Complexity of Data Reviewed Labs: ordered. Radiology: ordered.  Risk OTC drugs. Prescription drug management. Decision regarding hospitalization.   Patient with vague complaints but supraventricular tachycardia noted on monitor.  I have reviewed her electrocardiogram, and my interpretation is supraventricular tachycardia which is new compared with 04/11/2021.  I  ordered adenosine 6 mg and following that, she briefly was in a sinus rhythm before resuming supraventricular tachycardia with heart rate in the 150s.  I ordered metoprolol 5 mg and following this, she converted to sinus rhythm and has been stable.  However, she still seems somewhat agitated without any localization of what is bothering her.  I have reviewed multiple electrocardiograms and my interpretation initial conversion to sinus rhythm with PVC and improvement in repolarization abnormality, then conversion back to SVT with worsening of repolarization abnormality.  Then conversion back to sinus rhythm with improvement in repolarization abnormality but repolarization abnormality persisting.  Her inability to communicate makes evaluation difficult.  I have ordered screening labs and will observe her in the emergency department.  I have reviewed her laboratory tests, and my interpretation is leukocytosis, severe hypokalemia, mildly elevated random glucose level, hypomagnesemia, elevated troponin which is felt most  likely to be from demand ischemia, elevated total bilirubin which is mainly indirect but with normal transaminases and alkaline phosphatase.  Because of leukocytosis and elevated bilirubin, I have ordered CT of abdomen and pelvis.  CT scan shows generalized enterocolitis, cirrhosis with portal hypertension including varices, incidental finding of right nephrolithiasis.  I have independently viewed the images, and agree with the radiologist's interpretation.  I have ordered ciprofloxacin and metronidazole.  I have ordered intravenous and oral potassium, intravenous magnesium .  Repeat troponin did rise, but I still believe this represents demand ischemia rather than ACS.  I have ordered rectal aspirin but I am holding off on anticoagulation.  At this point I feel risk of bleeding in the setting of enterocolitis is greater than the benefit that would be achieved.  She will need to be admitted.  I have discussed the case with Dr. Sunnie England of [facility], who agrees to admit the patient.  CRITICAL CARE Performed by: Alissa April Total critical care time: 160 minutes Critical care time was exclusive of separately billable procedures and treating other patients. Critical care was necessary to treat or prevent imminent or life-threatening deterioration. Critical care was time spent personally by me on the following activities: development of treatment plan with patient and/or surrogate as well as nursing, discussions with consultants, evaluation of patient's response to treatment, examination of patient, obtaining history from patient or surrogate, ordering and performing treatments and interventions, ordering and review of laboratory studies, ordering and review of radiographic studies, pulse oximetry and re-evaluation of patient's condition.  Final Clinical Impression(s) / ED Diagnoses Final diagnoses:  Enterocolitis  PSVT (paroxysmal supraventricular tachycardia) (HCC)  Hypokalemia  Hypomagnesemia   Elevated troponin  Serum total bilirubin elevated  Elevated random blood glucose level    Rx / DC Orders ED Discharge Orders     None         Alissa April, MD 09/01/23 [phone]

## 2023-09-01 NOTE — Consult Note (Signed)
 Consultation Note Date: 09/01/2023   Patient Name: Cynthia Mclean  DOB: February 06, 1966  MRN: [id number]  Age / Sex: 58 y.o., female  PCP: Cynthia Sport, MD Referring Physician: Justina Oman, MD  Reason for Consultation: Establishing goals of care  HPI/Patient Profile: 58 y.o. female  with past medical history of hypertension, hyperlipidemia, seizure disorder, stroke with left hemiparesis admitted on 09/01/2023 with CT cannot showing generalized enterocolitis, cirrhosis with portal hypertension including varices.   Clinical Assessment and Goals of Care: Face-to-face conference with bedside nursing staff and transition of care team related to patient condition, needs, goals of care.  I have reviewed medical records including EPIC notes, labs and imaging, received report from RN, assessed the patient.  Cynthia Mclean is sitting up in bed.  She appears chronically ill and somewhat frail.  She greets me, making and somewhat keeping eye contact.  She is alert and oriented x 3.  I believe that she can make her basic needs known if given enough time.  There is no family at bedside at this time.  I offer Cynthia Mclean something to eat and drink which she accepts.  She tells me that she usually feeds herself at home.    We talked about her acute illness.  She tells me that she would like to return home, not go to rehab if at all possible.  She tells me that her daughter, Cynthia Mclean, lives in her home.  I ask how long it has been since she had had her stroke and she states that it has been a "long time".     Call to daughter, Cynthia Mclean, to discuss diagnosis prognosis, GOC, EOL wishes, disposition and options. I introduced Palliative Medicine as specialized medical care for people living with serious illness. It focuses on providing relief from the symptoms and stress of a serious illness. The goal is to improve quality of life for both  the patient and the family.  We discussed a brief life review of the patient.  Cynthia Mclean states that her mother had a stroke June 2020 for the left her weak on her left side.  Cynthia Mclean states she lives in her mother's home and is her main caregiver.  Cynthia Mclean is incontinent of urine.    We then focused on their current illness.  We talked about colitis and the treatment plan.  We talked about time for outcomes.  We review selected labs.  I talked about the decline that [name] has seen since her stroke less than 1 year ago.   The natural disease trajectory and expectations at EOL were discussed.   Advanced directives, concepts specific to code status, artifical feeding and hydration, and rehospitalization were considered and discussed. Cynthia Mclean states that she and her mother have not discussed CODE STATUS.  We talk about the concept of "treat the treatable, but allow a natural passing".  I encourage patient family to think about what they do want, what they do not want.  Palliative Care services outpatient were explained and offered.  We talked about the benefits of outpatient palliative services for further support and goals of care discussions.  We talked about how to put the services in place.  Family considering.  Discussed the importance of continued conversation with family and the medical providers regarding overall plan of care and treatment options, ensuring decisions are within the context of the patient's values and GOCs.  Questions and concerns were addressed.  Hard Choices booklet left for review. The family was encouraged to call with questions or concerns.  PMT will continue to support holistically.   HCPOA  HCPOA - sister Cynthia Mclean, per daughter Cynthia Mclean.      SUMMARY OF RECOMMENDATIONS   At this point full scope/full code Considering CODE STATUS Home with home health if needed/qualified Considering outpatient palliative services   Code Status/Advance Care Planning: Full code  -Cynthia Mclean states that she and her mother have not discussed CODE STATUS.  We talk about the concept of "treat the treatable, but allow a natural passing".  I encourage patient family to think about what they do want, what they do not want.  Symptom Management:  Per hospitalist, no additional needs at this time.  Palliative Prophylaxis:  Frequent Pain Assessment, Oral Care, and Turn Reposition  Additional Recommendations (Limitations, Scope, Preferences): Full Scope Treatment  Psycho-social/Spiritual:  Desire for further Chaplaincy support:no Additional Recommendations: Caregiving  Support/Resources  Prognosis:  Unable to determine, based on outcomes.  Guarded at this point.  Discharge Planning: To Be Determined      Primary Diagnoses: Present on Admission:  Colitis   I have reviewed the medical record, interviewed the patient and family, and examined the patient. The following aspects are pertinent.  Past Medical History:  Diagnosis Date   Alcohol dependency (HCC)    HELICOBACTER PYLORI [H. PYLORI] INFECTION 02/24/2010   Qualifier: Diagnosis of  By: Artelia Laroche.    Hypertension    IDA (iron  deficiency anemia)    required blood tranfusion   Nicotine addiction    Seizure disorder (HCC)    Social History   Socioeconomic History   Marital status: Single    Spouse name: Not on file   Number of children: 5   Years of education: Not on file   Highest education level: Not on file  Occupational History   Occupation: works at shelter  Tobacco Use   Smoking status: Never   Smokeless tobacco: Current    Types: Snuff  Substance and Sexual Activity   Alcohol use: Not Currently    Comment: last drank last night 1 40 oz   Drug use: No   Sexual activity: Yes    Birth control/protection: Surgical  Other Topics Concern   Not on file  Social History Narrative   Not on file   Social Drivers of Health   Financial Resource Strain: Low Risk  (01/19/2023)   Overall  Financial Resource Strain (CARDIA)    Difficulty of Paying Living Expenses: Not hard at all  Food Insecurity: No Food Insecurity (01/19/2023)   Hunger Vital Sign    Worried About Running Out of Food in the Last Year: Never true    Ran Out of Food in the Last Year: Never true  Transportation Needs: No Transportation Needs (01/19/2023)   PRAPARE - Administrator, Civil Service (Medical): No    Lack of Transportation (Non-Medical): No  Physical Activity: Inactive (01/19/2023)   Exercise Vital Sign    Days of Exercise per Week: 0 days  Minutes of Exercise per Session: 0 min  Stress: No Stress Concern Present (01/19/2023)   [employer] of Occupational Health - Occupational Stress Questionnaire    Feeling of Stress : Not at all  Social Connections: Moderately Isolated (01/19/2023)   Social Connection and Isolation Panel [NHANES]    Frequency of Communication with Friends and Family: More than three times a week    Frequency of Social Gatherings with Friends and Family: More than three times a week    Attends Religious Services: More than 4 times per year    Active Member of [employer] or Organizations: No    Attends Engineer, structural: Never    Marital Status: Never married   Family History  Problem Relation Age of Onset   Anxiety disorder Sister    Scheduled Meds:  atorvastatin   20 mg Oral Daily   Chlorhexidine Gluconate Cloth  6 each Topical Q0600   enoxaparin  (LOVENOX ) injection  40 mg Subcutaneous Q24H   ferrous sulfate   325 mg Oral Daily   pantoprazole  40 mg Oral Daily   phenytoin   300 mg Oral Daily   potassium chloride  SA  40 mEq Oral Once   Continuous Infusions:  sodium chloride      0.9 % NaCl with KCl 40 mEq / L 75 mL/hr at 09/01/23 1610   potassium chloride  10 mEq (09/01/23 0949)   PRN Meds:.acetaminophen  **OR** acetaminophen , hydrOXYzine , ondansetron  **OR** ondansetron  (ZOFRAN ) IV, traZODone  Medications Prior to Admission:  Prior to Admission  medications   Medication Sig Start Date End Date Taking? Authorizing Provider  acetaminophen  (TYLENOL ) 325 MG tablet Take 2 tablets (650 mg total) by mouth every 4 (four) hours as needed for mild pain (or temp >/= 99.5). 03/13/20  Yes Emokpae, Courage, MD  amLODipine  (NORVASC ) 5 MG tablet Take 1 tablet (5 mg total) by mouth daily. 05/09/23  Yes Cynthia Sport, MD  lisinopril  (ZESTRIL ) 40 MG tablet Take 1 tablet (40 mg total) by mouth daily. 05/09/23  Yes Cynthia Sport, MD  phenytoin  (DILANTIN ) 100 MG ER capsule Take 3 capsules (300 mg total) by mouth daily. Patient taking differently: Take 200 mg by mouth daily. 05/09/23  Yes Cynthia Sport, MD  traZODone  (DESYREL ) 50 MG tablet Take 0.5-1 tablets (25-50 mg total) by mouth at bedtime as needed for sleep. 05/09/23  Yes Cynthia Sport, MD  atorvastatin  (LIPITOR) 20 MG tablet Take 1 tablet (20 mg total) by mouth daily. Patient not taking: Reported on 09/01/2023 05/09/23   Cynthia Sport, MD  hydrOXYzine  (ATARAX ) 25 MG tablet Take 1 tablet (25 mg total) by mouth 2 (two) times daily as needed for anxiety. Patient not taking: Reported on 09/01/2023 08/25/21   Cynthia Sport, MD  Iron , Ferrous Sulfate , 325 (65 Fe) MG TABS Take 325 mg by mouth daily. Patient not taking: Reported on 09/01/2023 08/25/21   Cynthia Sport, MD  UNABLE TO FIND Med Name: Adult diapers,Pads,Wipes. DX:R32 11/24/21   Cynthia Sport, MD   No Known Allergies Review of Systems  Unable to perform ROS: Acuity of condition    Physical Exam Vitals and nursing note reviewed.  Constitutional:      General: She is not in acute distress.    Appearance: She is ill-appearing.  Cardiovascular:     Rate and Rhythm: Normal rate.  Pulmonary:     Effort: Pulmonary effort is normal.  Neurological:     Mental Status: She is alert and oriented to person, place, and time.  Psychiatric:     Comments: Calm and cooperative      Vital Signs: BP 110/60   Pulse 60   Temp (!) 96.4 F (35.8 C)  (Axillary)   Resp 17   Ht 5\' 8"  (1.727 m)   Wt 58.8 kg   LMP 05/14/2013   SpO2 100%   BMI 19.71 kg/m  Pain Scale: Faces   Pain Score: 0-No pain   SpO2: SpO2: 100 % O2 Device:SpO2: 100 % O2 Flow Rate: .O2 Flow Rate (L/min): 1 L/min  IO: Intake/output summary:  Intake/Output Summary (Last 24 hours) at 09/01/2023 1025 Last data filed at 09/01/2023 4098 Gross per 24 hour  Intake 2266.29 ml  Output --  Net 2266.29 ml    LBM:   Baseline Weight: Weight: 57.6 kg Most recent weight: Weight: 58.8 kg     Palliative Assessment/Data:     Time In: 1100   Time Out: 1215 Time Total: 75 minutes  Greater than 50%  of this time was spent counseling and coordinating care related to the above assessment and plan.  Signed by: Annabelle Barrack, NP   Please contact Palliative Medicine Team phone at [phone] for questions and concerns.  For individual provider: See Tilford Foley

## 2023-09-02 ENCOUNTER — Other Ambulatory Visit (HOSPITAL_COMMUNITY)

## 2023-09-02 DIAGNOSIS — R7989 Other specified abnormal findings of blood chemistry: Secondary | ICD-10-CM | POA: Diagnosis not present

## 2023-09-02 DIAGNOSIS — K529 Noninfective gastroenteritis and colitis, unspecified: Secondary | ICD-10-CM | POA: Diagnosis not present

## 2023-09-02 DIAGNOSIS — E876 Hypokalemia: Secondary | ICD-10-CM | POA: Diagnosis not present

## 2023-09-02 LAB — BASIC METABOLIC PANEL WITH GFR
Anion gap: 8 (ref 5–15)
BUN: <5 mg/dL — ABNORMAL LOW (ref 6–20)
CO2: 29 mmol/L (ref 22–32)
Calcium: 7.8 mg/dL — ABNORMAL LOW (ref 8.9–10.3)
Chloride: 102 mmol/L (ref 98–111)
Creatinine, Ser: 0.64 mg/dL (ref 0.44–1.00)
GFR, Estimated: >60 mL/min (ref >60)
Glucose, Bld: 103 mg/dL — ABNORMAL HIGH (ref 70–99)
Potassium: 2.1 mmol/L — CL (ref 3.5–5.1)
Sodium: 139 mmol/L (ref 135–145)

## 2023-09-02 LAB — CBC
HCT: 27.9 % — ABNORMAL LOW (ref 36.0–46.0)
Hemoglobin: 9.8 g/dL — ABNORMAL LOW (ref 12.0–15.0)
MCH: 34.5 pg — ABNORMAL HIGH (ref 26.0–34.0)
MCHC: 35.1 g/dL (ref 30.0–36.0)
MCV: 98.2 fL (ref 80.0–100.0)
Platelets: 174 10*3/uL (ref 150–400)
RBC: 2.84 MIL/uL — ABNORMAL LOW (ref 3.87–5.11)
RDW: 14.6 % (ref 11.5–15.5)
WBC: 14.6 10*3/uL — ABNORMAL HIGH (ref 4.0–10.5)
nRBC: 0 % (ref 0.0–0.2)

## 2023-09-02 LAB — FOLATE: Folate: 2.8 ng/mL — ABNORMAL LOW (ref >5.9)

## 2023-09-02 LAB — VITAMIN B12: Vitamin B-12: 610 pg/mL (ref 180–914)

## 2023-09-02 LAB — MAGNESIUM: Magnesium: 1.8 mg/dL (ref 1.7–2.4)

## 2023-09-02 MED ORDER — POTASSIUM CHLORIDE CRYS ER 20 MEQ PO TBCR
40.0000 meq | EXTENDED_RELEASE_TABLET | ORAL | Status: AC
  Administered 2023-09-02 (×3): 40 meq via ORAL
  Filled 2023-09-02 (×3): qty 2

## 2023-09-02 MED ORDER — POTASSIUM CHLORIDE 10 MEQ/100ML IV SOLN
10.0000 meq | INTRAVENOUS | Status: AC
  Administered 2023-09-02 (×3): 10 meq via INTRAVENOUS
  Filled 2023-09-02 (×3): qty 100

## 2023-09-02 MED ORDER — FOLIC ACID 1 MG PO TABS
1.0000 mg | ORAL_TABLET | Freq: Every day | ORAL | Status: DC
  Administered 2023-09-02 – 2023-09-04 (×3): 1 mg via ORAL
  Filled 2023-09-02 (×3): qty 1

## 2023-09-02 NOTE — Evaluation (Signed)
 Physical Therapy Evaluation Patient Details Name: Cynthia Mclean MRN: [id number] DOB: 02-Apr-1966 Today's Date: 09/02/2023  History of Present Illness  Cynthia Mclean is a 58 y.o. female with medical history significant of hypertension, seizure disorder, prior history of stroke with left hemiparesis, hyperlipidemia and GERd; who presented to ED with concerns for general malaise and near syncope event.  Symptoms presented sudden and roughly 12 hours prior to admission.  There have been complaints of decreased oral intake without specific fevers, shortness of breath, chest pain, abdominal pain, dysuria/hematuria, diarrhea, melena/hematochezia associated nausea vomiting or any other complaints.   Clinical Impression  Patient functioning near baseline for functional mobility and gait which is none ambulatory and assisted for transfers at home, "per patient." Patient has difficulty maintaining standing balance due to baseline left side weakness and requires Max assist for sit to stands and transferring to chair. Patient tolerated sitting up in chair after therapy. Patient will benefit from continued skilled physical therapy in hospital and recommended venue below to increase strength, balance, endurance for safe ADLs and gait.          If plan is discharge home, recommend the following: A lot of help with bathing/dressing/bathroom;A lot of help with walking and/or transfers;Help with stairs or ramp for entrance;Assistance with cooking/housework   Can travel by private vehicle        Equipment Recommendations None recommended by PT  Recommendations for Other Services       Functional Status Assessment Patient has had a recent decline in their functional status and/or demonstrates limited ability to make significant improvements in function in a reasonable and predictable amount of time     Precautions / Restrictions        Mobility  Bed Mobility Overal bed mobility: Needs Assistance Bed  Mobility: Supine to Sit     Supine to sit: Mod assist, HOB elevated, Used rails     General bed mobility comments: as per OT notes    Transfers Overall transfer level: Needs assistance Equipment used: 1 person hand held assist, None Transfers: Sit to/from Stand, Bed to chair/wheelchair/BSC Sit to Stand: Max assist Stand pivot transfers: Max assist         General transfer comment: unsteady labored movement with poor tolerance for supporting self with LLE due to weakness    Ambulation/Gait                  Stairs            Wheelchair Mobility     Tilt Bed    Modified Rankin (Stroke Patients Only)       Balance Overall balance assessment: Needs assistance Sitting-balance support: Feet supported, No upper extremity supported Sitting balance-Leahy Scale: Fair Sitting balance - Comments: seated at EOB   Standing balance support: During functional activity, Single extremity supported Standing balance-Leahy Scale: Poor Standing balance comment: using RUE on armrest of chair with hand held assist                             Pertinent Vitals/Pain Pain Assessment Pain Assessment: No/denies pain    Home Living Family/patient expects to be discharged to:: Private residence Living Arrangements: Children Available Help at Discharge: Family;Available 24 hours/day Type of Home: House Home Access: Level entry       Home Layout: One level Home Equipment: Wheelchair - manual;BSC/3in1;Grab bars - tub/shower      Prior Function Prior Level of Function :  Needs assist       Physical Assist : ADLs (physical);Mobility (physical) Mobility (physical): Bed mobility;Transfers;Gait;Stairs ADLs (physical): IADLs;Toileting;Dressing;Bathing;Grooming Mobility Comments: assisted transfers, uses wheelchair for mobility, non-ambulatory ADLs Comments: Assist for all other than feeding. Pt reports she can feed herself, but likely requires set up assist.      Extremity/Trunk Assessment   Upper Extremity Assessment Upper Extremity Assessment: Defer to OT evaluation RUE Deficits / Details: 3-/5 shoulder flexion; generally weak otherwise. Limited P/ROM fo shoulder to near 75% available range as well. LUE Deficits / Details: Very limited at baseline from prior stroke. No functional use. Significant tone in wrist and hand. near full P/ROM of elbow; limited to ~50% avaialble range for shoulder flexion.    Lower Extremity Assessment Lower Extremity Assessment: Generalized weakness;RLE deficits/detail;LLE deficits/detail RLE Deficits / Details: grossly -4/5 RLE Sensation: WNL RLE Coordination: WNL LLE Deficits / Details: grossly -3/5 LLE Sensation: decreased light touch;decreased proprioception LLE Coordination: decreased fine motor;decreased gross motor    Cervical / Trunk Assessment Cervical / Trunk Assessment: Kyphotic  Communication   Communication Communication: No apparent difficulties    Cognition Arousal: Alert Behavior During Therapy: WFL for tasks assessed/performed                             Following commands: Intact       Cueing Cueing Techniques: Verbal cues, Tactile cues     General Comments      Exercises     Assessment/Plan    PT Assessment Patient needs continued PT services  PT Problem List Decreased strength;Decreased activity tolerance;Decreased balance;Decreased mobility       PT Treatment Interventions DME instruction;Functional mobility training;Therapeutic activities;Therapeutic exercise;Balance training;Wheelchair mobility training;Patient/family education    PT Goals (Current goals can be found in the Care Plan section)  Acute Rehab PT Goals Patient Stated Goal: return home with family to assist PT Goal Formulation: With patient Time For Goal Achievement: 09/09/23 Potential to Achieve Goals: Good    Frequency Min 2X/week     Co-evaluation PT/OT/SLP Co-Evaluation/Treatment:  Yes Reason for Co-Treatment: To address functional/ADL transfers PT goals addressed during session: Mobility/safety with mobility;Balance OT goals addressed during session: ADL's and self-care       AM-PAC PT "6 Clicks" Mobility  Outcome Measure Help needed turning from your back to your side while in a flat bed without using bedrails?: A Lot Help needed moving from lying on your back to sitting on the side of a flat bed without using bedrails?: A Lot Help needed moving to and from a bed to a chair (including a wheelchair)?: A Lot Help needed standing up from a chair using your arms (e.g., wheelchair or bedside chair)?: A Lot Help needed to walk in hospital room?: Total Help needed climbing 3-5 steps with a railing? : Total 6 Click Score: 10    End of Session Equipment Utilized During Treatment: Gait belt Activity Tolerance: Patient tolerated treatment well;Patient limited by fatigue Patient left: in chair;with call bell/phone within reach Nurse Communication: Mobility status PT Visit Diagnosis: Unsteadiness on feet (R26.81);Other abnormalities of gait and mobility (R26.89);Muscle weakness (generalized) (M62.81)    Time: 1610-9604 PT Time Calculation (min) (ACUTE ONLY): 10 min   Charges:   PT Evaluation $PT Eval Low Complexity: 1 Low PT Treatments $Therapeutic Activity: 8-22 mins PT General Charges $$ ACUTE PT VISIT: 1 Visit         3:31 PM, 09/02/23 Walton Guppy, MPT Physical Therapist with  [facility] 336 [phone] office [phone] mobile phone

## 2023-09-02 NOTE — Plan of Care (Signed)
  Problem: Acute Rehab OT Goals (only OT should resolve) Goal: Pt. Will Perform Grooming Flowsheets (Taken 09/02/2023 1422) Pt Will Perform Grooming: with min assist Goal: Pt. Will Perform Upper Body Dressing Flowsheets (Taken 09/02/2023 1422) Pt Will Perform Upper Body Dressing: with min assist Goal: Pt/Caregiver Will Perform Home Exercise Program Flowsheets (Taken 09/02/2023 1422) Pt/caregiver will Perform Home Exercise Program:  Increased ROM  Increased strength  Right Upper extremity  Left upper extremity  With minimal assist  Krishav Mamone OT, MOT

## 2023-09-02 NOTE — TOC Initial Note (Signed)
 Transition of Care [facility]) - Initial/Assessment Note    Patient Details  Name: Cynthia Mclean MRN: [id number] Date of Birth: [birth year]  Transition of [facility] CM/SW Contact:    Grandville Lax, LCSWA Phone Number: 09/02/2023, 2:12 PM  Clinical Narrative:                 CSW updated that PT is recommending HH PT for pt at D/C. CSW spoke with pts daughter who states pt lives with her and she assists with ADLs. Pt has had HH with [facility] in the past, pts daughter states they are agreeable to using them again. Pt has a walker to use in the home. TOC to follow.   Expected Discharge Plan: Home w Home Health Services Barriers to Discharge: Continued Medical Work up   Patient Goals and CMS Choice Patient states their goals for this hospitalization and ongoing recovery are:: return home CMS Medicare.gov Compare Post Acute Care list provided to:: Patient Represenative (must comment) Choice offered to / list presented to : Patient, Adult Children      Expected Discharge Plan and Services In-house Referral: Clinical Social Work Discharge Planning Services: CM Consult Post Acute Care Choice: Home Health Living arrangements for the past 2 months: Single Family Home                                      Prior Living Arrangements/Services Living arrangements for the past 2 months: Single Family Home Lives with:: Adult Children Patient language and need for interpreter reviewed:: Yes Do you feel safe going back to the place where you live?: Yes      Need for Family Participation in Patient Care: Yes (Comment) Care giver support system in place?: Yes (comment) Current home services: DME Criminal Activity/Legal Involvement Pertinent to Current Situation/Hospitalization: No - Comment as needed  Activities of Daily Living      Permission Sought/Granted                  Emotional Assessment Appearance:: Appears stated age Attitude/Demeanor/Rapport: Engaged Affect  (typically observed): Accepting Orientation: : Oriented to Self Alcohol / Substance Use: Not Applicable Psych Involvement: No (comment)  Admission diagnosis:  Enterocolitis [K52.9] Hypokalemia [E87.6] Hypomagnesemia [E83.42] Colitis [K52.9] PSVT (paroxysmal supraventricular tachycardia) (HCC) [I47.10] Serum total bilirubin elevated [R17] Elevated troponin [R79.89] Elevated random blood glucose level [R73.9] Patient Active Problem List   Diagnosis Date Noted   Colitis 09/01/2023   Hip swelling, left 05/12/2023   Thrombocytopenia (HCC) 11/24/2022   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) 08/17/2022   Primary insomnia 06/21/2022   Screening for colon cancer 06/21/2022   Refused influenza vaccine 01/28/2022   Involuntary muscle contractions 11/13/2021   Herpes zoster 11/04/2021   Encounter for general adult medical examination with abnormal findings 09/28/2021   Physical deconditioning 09/28/2021   Mixed hyperlipidemia 06/26/2021   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  [address]elf[address] Fellers [address] [place] [address] Phone: [phone] Fax: [phone]  [facility] [place], [place] - [address] [place] [address] [place] [address] Phone: [phone] Fax: [phone]     Social Drivers of Health (SDOH) Social History: SDOH Screenings   Food Insecurity: No Food Insecurity (01/19/2023)   Housing: Low Risk  (01/19/2023)  Transportation Needs: No Transportation Needs (01/19/2023)  Utilities: Not At Risk (01/19/2023)  Alcohol Screen: Low Risk  (01/19/2023)  Depression (PHQ2-9): Low Risk  (05/09/2023)  Financial Resource Strain: Low Risk  (01/19/2023)  Physical Activity: Inactive (01/19/2023)  Social Connections: Moderately Isolated (01/19/2023)  Stress: No Stress Concern Present (01/19/2023)  Tobacco Use: High Risk (09/01/2023)  Health Literacy: Adequate Health Literacy (01/19/2023)   SDOH Interventions:     Readmission Risk Interventions    09/02/2023    2:11 PM  Readmission Risk Prevention Plan  Medication Screening Complete  Transportation Screening Complete

## 2023-09-02 NOTE — Plan of Care (Signed)
  Problem: Acute Rehab PT Goals(only PT should resolve) Goal: Pt will Roll Supine to Side Outcome: Progressing Flowsheets (Taken 09/02/2023 1532) Pt will Roll Supine to Side: with min assist Goal: Pt Will Go Supine/Side To Sit Outcome: Progressing Flowsheets (Taken 09/02/2023 1532) Pt will go Supine/Side to Sit: with minimal assist Goal: Pt Will Go Sit To Supine/Side Outcome: Progressing Flowsheets (Taken 09/02/2023 1532) Pt will go Sit to Supine/Side:  with minimal assist  with moderate assist Goal: Patient Will Transfer Sit To/From Stand Outcome: Progressing Flowsheets (Taken 09/02/2023 1532) Patient will transfer sit to/from stand:  with minimal assist  with moderate assist   3:32 PM, 09/02/23 Walton Guppy, MPT Physical Therapist with [facility] 336 [phone] office [phone] mobile phone

## 2023-09-02 NOTE — Evaluation (Signed)
 Occupational Therapy Evaluation Patient Details Name: Cynthia Mclean MRN: [id number] DOB: [birth year] Today's Date: 09/02/2023   History of Present Illness   SHAWNIE Mclean is a 58 y.o. female with medical history significant of hypertension, seizure disorder, prior history of stroke with left hemiparesis, hyperlipidemia and GERd; who presented to ED with concerns for general malaise and near syncope event.  Symptoms presented sudden and roughly 12 hours prior to admission.  There have been complaints of decreased oral intake without specific fevers, shortness of breath, chest pain, abdominal pain, dysuria/hematuria, diarrhea, melena/hematochezia associated nausea vomiting or any other complaints. (per MD)     Clinical Impressions Pt agreeable to OT and PT co-evaluation. Pt is assisted for most ADL's at baseline by daughter, per pt report. Pt reports daughter assist pt with transfers to w/c at baseline. Pt required mod A for bed mobility with HOB elevated and max A for stand pivot to chair without AD. Significantly limited L UE at baseline from a previous stroke. Pt left in the chair with call bell within reach. Pt will benefit from continued OT in the hospital and recommended venue below to increase strength, balance, and endurance for safe ADL's.        If plan is discharge home, recommend the following:   A lot of help with walking and/or transfers;A lot of help with bathing/dressing/bathroom;Assistance with cooking/housework;Assist for transportation;Help with stairs or ramp for entrance;Assistance with feeding     Functional Status Assessment   Patient has not had a recent decline in their functional status     Equipment Recommendations   None recommended by OT             Precautions/Restrictions   Precautions Precautions: Fall Recall of Precautions/Restrictions: Intact Restrictions Weight Bearing Restrictions Per Provider Order: No     Mobility Bed Mobility Overal  bed mobility: Needs Assistance Bed Mobility: Supine to Sit     Supine to sit: Mod assist, HOB elevated, Used rails     General bed mobility comments: labored movement; assist to pull to sit and scoot to EOB.    Transfers Overall transfer level: Needs assistance   Transfers: Sit to/from Stand, Bed to chair/wheelchair/BSC Sit to Stand: Max assist Stand pivot transfers: Max assist         General transfer comment: no AD; much assist      Balance Overall balance assessment: Needs assistance Sitting-balance support: Feet supported, Single extremity supported Sitting balance-Leahy Scale: Fair Sitting balance - Comments: seated at EOB   Standing balance support: Single extremity supported Standing balance-Leahy Scale: Poor Standing balance comment: grasping onto this therapist during sit to stand                           ADL either performed or assessed with clinical judgement   ADL Overall ADL's : Needs assistance/impaired Eating/Feeding: Set up;Sitting   Grooming: Moderate assistance;Maximal assistance;Sitting   Upper Body Bathing: Moderate assistance;Maximal assistance;Sitting   Lower Body Bathing: Maximal assistance;Total assistance;Bed level   Upper Body Dressing : Moderate assistance;Maximal assistance;Sitting   Lower Body Dressing: Maximal assistance;Total assistance;Bed level   Toilet Transfer: Maximal assistance;Stand-pivot Toilet Transfer Details (indicate cue type and reason): Simulated via EOB to chair Toileting- Clothing Manipulation and Hygiene: Total assistance;Maximal assistance;Bed level               Vision Baseline Vision/History: 0 No visual deficits Ability to See in Adequate Light: 0 Adequate Patient Visual Report: No  change from baseline Vision Assessment?: No apparent visual deficits     Perception Perception: Not tested       Praxis Praxis: Not tested       Pertinent Vitals/Pain Pain Assessment Pain Assessment:  No/denies pain     Extremity/Trunk Assessment Upper Extremity Assessment Upper Extremity Assessment: LUE deficits/detail;RUE deficits/detail RUE Deficits / Details: 3-/5 shoulder flexion; generally weak otherwise. Limited P/ROM fo shoulder to near 75% available range as well. LUE Deficits / Details: Very limited at baseline from prior stroke. No functional use. Significant tone in wrist and hand. near full P/ROM of elbow; limited to ~50% avaialble range for shoulder flexion.   Lower Extremity Assessment Lower Extremity Assessment: Defer to PT evaluation   Cervical / Trunk Assessment Cervical / Trunk Assessment: Kyphotic   Communication Communication Communication: No apparent difficulties   Cognition Arousal: Alert Behavior During Therapy: WFL for tasks assessed/performed Cognition: No family/caregiver present to determine baseline             OT - Cognition Comments: Pt able to follow commands, but seeming somewhat confused at times.                 Following commands: Intact       Cueing  General Comments   Cueing Techniques: Verbal cues;Tactile cues                 Home Living Family/patient expects to be discharged to:: Private residence Living Arrangements: Children Available Help at Discharge: Family;Available 24 hours/day Type of Home: House Home Access: Level entry     Home Layout: One level     Bathroom Shower/Tub: Tub/shower unit;Walk-in shower   Bathroom Toilet: Standard Bathroom Accessibility: Yes How Accessible: Accessible via wheelchair;Accessible via walker Home Equipment: Wheelchair - manual;BSC/3in1;Grab bars - tub/shower          Prior Functioning/Environment Prior Level of Function : Needs assist       Physical Assist : ADLs (physical);Mobility (physical) Mobility (physical): Bed mobility;Transfers;Gait;Stairs ADLs (physical): IADLs;Toileting;Dressing;Bathing;Grooming Mobility Comments: SPT with assist from daughter  and no AD. ADLs Comments: Assist for all other than feeding. Pt reports she can feed herself, but likely requires set up assist.    OT Problem List: Decreased strength;Decreased range of motion;Decreased activity tolerance;Impaired balance (sitting and/or standing)   OT Treatment/Interventions: Self-care/ADL training;Therapeutic exercise;Therapeutic activities;Patient/family education;Balance training      OT Goals(Current goals can be found in the care plan section)   Acute Rehab OT Goals Patient Stated Goal: return home OT Goal Formulation: With patient Time For Goal Achievement: 09/16/23 Potential to Achieve Goals: Good   OT Frequency:  Min 1X/week    Co-evaluation PT/OT/SLP Co-Evaluation/Treatment: Yes Reason for Co-Treatment: To address functional/ADL transfers   OT goals addressed during session: ADL's and self-care                       End of Session Equipment Utilized During Treatment: Gait belt Nurse Communication: Other (comment) (notified the pt was in the chair)  Activity Tolerance: Patient tolerated treatment well Patient left: in chair;with call bell/phone within reach  OT Visit Diagnosis: Muscle weakness (generalized) (M62.81)                Time: 1610-9604 OT Time Calculation (min): 34 min Charges:  OT General Charges $OT Visit: 1 Visit OT Evaluation $OT Eval Low Complexity: 1 Low  Devone Tousley OT, MOT  Thurnell Floss 09/02/2023, 2:13 PM

## 2023-09-02 NOTE — Progress Notes (Signed)
 Progress Note   Patient: Cynthia Mclean ZOX:[id number] DOB: [birth year] DOA: 09/01/2023     1 DOS: the patient was seen and examined on 09/02/2023   Brief hospital admission narrative: KRISTIONA SAVELL is a 58 y.o. female with medical history significant of hypertension, seizure disorder, prior history of stroke with left hemiparesis, hyperlipidemia and GERd; who presented to ED with concerns for general malaise and near syncope event.  Symptoms presented sudden and roughly 12 hours prior to admission.  There have been complaints of decreased oral intake without specific fevers, shortness of breath, chest pain, abdominal pain, dysuria/hematuria, diarrhea, melena/hematochezia associated nausea vomiting or any other complaints.   Workup in the ED demonstrating severe hypokalemia, leukocytosis, hypomagnesemia mildly elevated troponin and a CT scan of the abdomen with concerns for generalized enterocolitis.  Fluid resuscitation and empiric antibiotics started.  Patient was found to have some SVT treated with adenosine  x 2; TRH consulted to place patient in the hospital for further evaluation and management  Assessment and plan 1-severe hypokalemia and hypomagnesemia - Patient reports no nausea, no vomiting, no diarrhea. - Potassium 2.1 - Magnesium  around 1.5 - Will continue electrolyte repletion and follow trend - Continue advancing diet as tolerated and maintain adequate hydration - Continue telemetry monitoring.  2-elevated troponin: -EKG and telemetry not demonstrating acute ischemic changes - Most likely in the setting of demand ischemia -2-D echo reassuring, preserved EF and not wall motion abnormalities appreciated. - Case discussed with cardiology and there was not need for ACS workup. - Continue electrolyte repletion.  3-enterocolitis - Incidental finding on CT abdomen - Patient reports no nausea, no vomiting, no abdominal pain - Diet has been further advanced and will follow tolerance -  Continue electrolytes repletion and supportive care.  4-GERD - Continue PPI.  5-hyperlipidemia - Continue statin.  6-history of seizure disorder - Continue treatment with phenytoin  - Seizure precaution in place.  7-history of insomnia - Continue as needed trazodone  at bedtime  8-prior history of CVA with residual left hemiparesis - Continue Refacto modification and the use of a statin - Prior to admission no using aspirin  or Plavix  9-social/ethics - Appreciate palliative care consultation - Planning for outpatient follow-up with palliative care - Patient continued to be full code.  10-folic acid  deficiency - Folate 2.8 - Daily supplementation started.  Subjective:  No fever, no nausea, no vomiting, no abdominal pain.  So far has tolerated diet and is overall feeling better.  Complaining of spasm and decreased range of motion of left lower extremity (more than baseline from her residual hemiparesis from previous stroke).  Physical Exam: Vitals:   09/02/23 1300 09/02/23 1400 09/02/23 1500 09/02/23 1607  BP: (!) 106/59 127/67 124/67   Pulse:      Resp: 16 20 14    Temp:    97.6 F (36.4 C)  TempSrc:    Oral  SpO2:      Weight:      Height:       General exam: Alert, awake, following commands appropriately and in no acute distress. Respiratory system: Clear to auscultation. Respiratory effort normal.  Good saturation on room air. Cardiovascular system: Rate controlled, no rubs, no gallops, no JVD. Gastrointestinal system: Abdomen is nondistended, soft and nontender. No organomegaly or masses felt. Normal bowel sounds heard. Central nervous system: No new focal deficits; positive left hemiparesis appreciated. Extremities: No cyanosis or clubbing. Skin: No petechiae. Psychiatry: Mood & affect appropriate.    Data Reviewed: Basic metabolic panel: Sodium 139, potassium  2.1, chloride 102, bicarb 29, BUN 5, creatinine 0.64 and GFR >60 CBC: White blood cells 14.6,  hemoglobin 9.8 and platelet count 1 74K Magnesium :1.8 B12: 16 Folate: 2.8  Family Communication: No family at bedside.  Disposition: Status is: Inpatient Remains inpatient appropriate because: Continue IV therapy.  Anticipating discharge home once medically stable and electrolytes repleted.  Time spent: 50 minutes  Author: Justina Oman, MD 09/02/2023 6:26 PM  For on call review [email or web address].

## 2023-09-02 NOTE — Care Management Important Message (Signed)
 Important Message  Patient Details  Name: Cynthia Mclean MRN: [id number] Date of Birth: Dec 19, 1965   Important Message Given:  Yes - Medicare IM     Neysa Arts L Joshuwa Vecchio 09/02/2023, 4:48 PM

## 2023-09-03 DIAGNOSIS — R7989 Other specified abnormal findings of blood chemistry: Secondary | ICD-10-CM | POA: Diagnosis not present

## 2023-09-03 DIAGNOSIS — K529 Noninfective gastroenteritis and colitis, unspecified: Secondary | ICD-10-CM | POA: Diagnosis not present

## 2023-09-03 DIAGNOSIS — E876 Hypokalemia: Secondary | ICD-10-CM | POA: Diagnosis not present

## 2023-09-03 LAB — BASIC METABOLIC PANEL WITH GFR
Anion gap: 8 (ref 5–15)
BUN: <5 mg/dL — ABNORMAL LOW (ref 6–20)
CO2: 25 mmol/L (ref 22–32)
Calcium: 7.7 mg/dL — ABNORMAL LOW (ref 8.9–10.3)
Chloride: 103 mmol/L (ref 98–111)
Creatinine, Ser: 0.79 mg/dL (ref 0.44–1.00)
GFR, Estimated: >60 mL/min (ref >60)
Glucose, Bld: 134 mg/dL — ABNORMAL HIGH (ref 70–99)
Potassium: 2.5 mmol/L — CL (ref 3.5–5.1)
Sodium: 136 mmol/L (ref 135–145)

## 2023-09-03 MED ORDER — POTASSIUM CHLORIDE CRYS ER 20 MEQ PO TBCR
40.0000 meq | EXTENDED_RELEASE_TABLET | ORAL | Status: AC
  Administered 2023-09-03 (×3): 40 meq via ORAL
  Filled 2023-09-03 (×3): qty 2

## 2023-09-03 NOTE — Progress Notes (Signed)
 Progress Note   Patient: Cynthia Mclean RUE:[id number] DOB: 08/18/1965 DOA: 09/01/2023     2 DOS: the patient was seen and examined on 09/03/2023   Brief hospital admission narrative: LYRIA LEYDEN is a 58 y.o. female with medical history significant of hypertension, seizure disorder, prior history of stroke with left hemiparesis, hyperlipidemia and GERd; who presented to ED with concerns for general malaise and near syncope event.  Symptoms presented sudden and roughly 12 hours prior to admission.  There have been complaints of decreased oral intake without specific fevers, shortness of breath, chest pain, abdominal pain, dysuria/hematuria, diarrhea, melena/hematochezia associated nausea vomiting or any other complaints.   Workup in the ED demonstrating severe hypokalemia, leukocytosis, hypomagnesemia mildly elevated troponin and a CT scan of the abdomen with concerns for generalized enterocolitis.  Fluid resuscitation and empiric antibiotics started.  Patient was found to have some SVT treated with adenosine  x 2; TRH consulted to place patient in the hospital for further evaluation and management  Assessment and plan 1-severe hypokalemia and hypomagnesemia - Patient reports no nausea, no vomiting, no diarrhea. - Potassium 2.5 currently; will continue oral repletion. - Magnesium  around 1.5 and repleted; will continue to follow trend. - Will continue electrolyte repletion and follow trend - Continue to maintain adequate oral hydration and follow response.  2-elevated troponin: -EKG and telemetry not demonstrating acute ischemic changes - Most likely in the setting of demand ischemia -2-D echo reassuring, preserved EF and not wall motion abnormalities appreciated. - Case discussed with cardiology and there was not need for ACS workup. - Continue electrolyte repletion.  3-enterocolitis - Incidental finding on CT abdomen - Patient reports no nausea, no vomiting, no abdominal pain - Diet has been  further advanced and will follow tolerance - Continue electrolytes repletion and supportive care.  4-GERD - Continue PPI.  5-hyperlipidemia - Continue statin.  6-history of seizure disorder - Continue treatment with phenytoin  - Seizure precaution in place.  7-history of insomnia - Continue as needed trazodone  at bedtime  8-prior history of CVA with residual left hemiparesis - Continue Refacto modification and the use of a statin - Prior to admission no using aspirin  or Plavix  9-social/ethics - Appreciate palliative care consultation - Planning for outpatient follow-up with palliative care - Patient continued to be full code.  10-folic acid  deficiency - Folate 2.8 - Daily supplementation started.  Subjective:  No fever, no chest pain, no nausea, no vomiting.  Overall feeling better and so far tolerating diet.  Still complaining of left lower extremity feeling weak care slightly more spastic.  Potassium 2.5.  Physical Exam: Vitals:   09/03/23 0400 09/03/23 0500 09/03/23 0600 09/03/23 0700  BP: (!) 134/51 (!) 106/37 (!) 149/51 (!) 98/40  Pulse:      Resp: 17 12 11 12   Temp:  (!) 97.4 F (36.3 C)    TempSrc:  Oral    SpO2:      Weight:      Height:       General exam: Alert, awake, oriented x 3; following commands appropriately and in no acute distress.  Still reporting feeling weaker on her left leg but otherwise improved symptoms. Respiratory system: Clear to auscultation. Respiratory effort normal.  Good saturation on room air. Cardiovascular system:RRR. No murmurs, rubs, gallops.  No JVD. Gastrointestinal system: Abdomen is nondistended, soft and nontender. No organomegaly or masses felt. Normal bowel sounds heard. Central nervous system: No new focal neurological deficits. Extremities: No cyanosis or clubbing; no edema. Skin: No  petechiae. Psychiatry:  Mood & affect appropriate.   Data Reviewed: Basic metabolic panel: Sodium 136, potassium 2.5, chloride 103,  bicarb 25, BUN 5, creatinine 0.79 and GFR >60 CBC: White blood cells 14.6, hemoglobin 9.8 and platelet count 1 74K Magnesium :1.8 B12: 16 Folate: 2.8  Family Communication: No family at bedside.  Disposition: Status is: Inpatient Remains inpatient appropriate because: Continue IV therapy.  Anticipating discharge home once medically stable and electrolytes repleted.  Time spent: 50 minutes  Author: Justina Oman, MD 09/03/2023 10:28 AM  For on call review [email or web address].

## 2023-09-03 NOTE — Plan of Care (Signed)
   Problem: Activity: Goal: Risk for activity intolerance will decrease Outcome: Progressing   Problem: Coping: Goal: Level of anxiety will decrease Outcome: Progressing

## 2023-09-04 DIAGNOSIS — K529 Noninfective gastroenteritis and colitis, unspecified: Secondary | ICD-10-CM | POA: Diagnosis not present

## 2023-09-04 DIAGNOSIS — E876 Hypokalemia: Secondary | ICD-10-CM | POA: Diagnosis not present

## 2023-09-04 DIAGNOSIS — I471 Supraventricular tachycardia, unspecified: Secondary | ICD-10-CM

## 2023-09-04 DIAGNOSIS — I10 Essential (primary) hypertension: Secondary | ICD-10-CM

## 2023-09-04 DIAGNOSIS — Z8673 Personal history of transient ischemic attack (TIA), and cerebral infarction without residual deficits: Secondary | ICD-10-CM

## 2023-09-04 DIAGNOSIS — R7989 Other specified abnormal findings of blood chemistry: Secondary | ICD-10-CM | POA: Diagnosis not present

## 2023-09-04 LAB — BASIC METABOLIC PANEL WITH GFR
Anion gap: 4 — ABNORMAL LOW (ref 5–15)
BUN: <5 mg/dL — ABNORMAL LOW (ref 6–20)
CO2: 26 mmol/L (ref 22–32)
Calcium: 7.6 mg/dL — ABNORMAL LOW (ref 8.9–10.3)
Chloride: 104 mmol/L (ref 98–111)
Creatinine, Ser: 0.83 mg/dL (ref 0.44–1.00)
GFR, Estimated: >60 mL/min (ref >60)
Glucose, Bld: 93 mg/dL (ref 70–99)
Potassium: 2.8 mmol/L — ABNORMAL LOW (ref 3.5–5.1)
Sodium: 134 mmol/L — ABNORMAL LOW (ref 135–145)

## 2023-09-04 LAB — MAGNESIUM: Magnesium: 1.5 mg/dL — ABNORMAL LOW (ref 1.7–2.4)

## 2023-09-04 MED ORDER — PANTOPRAZOLE SODIUM 40 MG PO TBEC: 40.0000 mg | DELAYED_RELEASE_TABLET | Freq: Every day | ORAL | 1 refills | Status: DC

## 2023-09-04 MED ORDER — ASPIRIN 81 MG PO TBEC: 81.0000 mg | DELAYED_RELEASE_TABLET | Freq: Every day | ORAL | 3 refills | Status: AC

## 2023-09-04 MED ORDER — MAGNESIUM SULFATE 2 GM/50ML IV SOLN
2.0000 g | Freq: Once | INTRAVENOUS | Status: AC
  Administered 2023-09-04: 2 g via INTRAVENOUS
  Filled 2023-09-04: qty 50

## 2023-09-04 MED ORDER — FOLIC ACID 1 MG PO TABS: 1.0000 mg | ORAL_TABLET | Freq: Every day | ORAL | 1 refills | Status: AC

## 2023-09-04 MED ORDER — POTASSIUM CHLORIDE CRYS ER 20 MEQ PO TBCR
40.0000 meq | EXTENDED_RELEASE_TABLET | ORAL | Status: AC
  Administered 2023-09-04 (×2): 40 meq via ORAL
  Filled 2023-09-04 (×2): qty 2

## 2023-09-04 MED ORDER — POTASSIUM CHLORIDE 20 MEQ/15ML (10%) PO SOLN: 40.0000 meq | Freq: Every day | ORAL | 0 refills | Status: DC

## 2023-09-04 MED ORDER — MAGNESIUM OXIDE -MG SUPPLEMENT 400 (240 MG) MG PO TABS: 400.0000 mg | ORAL_TABLET | Freq: Every day | ORAL | 1 refills | Status: AC

## 2023-09-04 MED ORDER — LISINOPRIL 20 MG PO TABS: 20.0000 mg | ORAL_TABLET | Freq: Every day | ORAL | Status: DC

## 2023-09-04 NOTE — Progress Notes (Signed)
 Patient discharged home. All questions and concerns answered. Patient has all personal property, including discharge paperwork. Patient left floor via wheelchair to POV.

## 2023-09-04 NOTE — Progress Notes (Signed)
   09/04/23 1452  TOC Discharge Assessment  Final next level of care Home w Home Health Services  Once discharged, how will the patient get to their discharge location? Family/Friend - Photographer  Barriers to Discharge No Barriers Identified  CMS Medicare.gov Compare Post Acute Care list provided to: Patient  HH Arranged PT  [facility] Agency [facility]  Date [facility] Agency Contacted 09/04/23  Time [facility] Agency Contacted [phone]  Representative spoke with at [facility]   Patient agreed to [facility] PT.

## 2023-09-04 NOTE — TOC Transition Note (Signed)
 Transition of Care [facility]) - Discharge Note Patient Details  Name: Cynthia Mclean MRN: [id number] Date of Birth: 15-Mar-1966  Transition of Care [facility]) CM/SW Contact:  Lynda Sands, RN Phone Number: 09/04/2023, 2:55 PM   Clinical Narrative:   Patient discharge home with [facility] PT. [facility] accepted care.    Final next level of care: Home w Home Health Services Barriers to Discharge: No Barriers Identified   Patient Goals and CMS Choice Patient states their goals for this hospitalization and ongoing recovery are:: return home CMS Medicare.gov Compare Post Acute Care list provided to:: Patient Choice offered to / list presented to : Patient, Adult Children    Discharge Placement     Home with [facility] PT  Discharge Plan and Services Additional resources added to the After Visit Summary for   In-house Referral: Clinical Social Work Discharge Planning Services: CM Consult Post Acute Care Choice: Home Health                 HH Arranged: PT [facility] Agency: [facility] Date [facility] Agency Contacted: 09/04/23 Time HH Agency Contacted: [phone] Representative spoke with at [facility] Agency: Randel Buss  Social Drivers of Health (SDOH) Interventions SDOH Screenings   Food Insecurity: Patient Unable To Answer (09/03/2023)  Housing: Patient Unable To Answer (09/03/2023)  Transportation Needs: Patient Unable To Answer (09/03/2023)  Utilities: Patient Unable To Answer (09/03/2023)  Alcohol Screen: Low Risk  (01/19/2023)  Depression (PHQ2-9): Low Risk  (05/09/2023)  Financial Resource Strain: Low Risk  (01/19/2023)  Physical Activity: Inactive (01/19/2023)  Social Connections: Moderately Isolated (01/19/2023)  Stress: No Stress Concern Present (01/19/2023)  Tobacco Use: High Risk (09/01/2023)  Health Literacy: Adequate Health Literacy (01/19/2023)     Readmission Risk Interventions    09/02/2023    2:11 PM  Readmission Risk Prevention Plan  Medication Screening Complete  Transportation  Screening Complete

## 2023-09-04 NOTE — Discharge Summary (Signed)
 Physician Discharge Summary   Patient: Cynthia Mclean MRN: [id number] DOB: 1965/07/01  Admit date:     09/01/2023  Discharge date: 09/04/23  Discharge Physician: Justina Oman   PCP: Meldon Sport, MD   Recommendations at discharge:  Repeat basic metabolic panel and magnesium  level to follow electrolytes trend and renal function if stability Repeat CBC to follow hemoglobin and stability Reassess blood pressure and adjust antihypertensive treatment as needed. Repeat folic acid  level in 8 weeks. Goals of care and advanced care planning discussion recommended.  Discharge Diagnoses: Principal Problem:   Enterocolitis Active Problems:   Hypomagnesemia   Elevated troponin   Hypokalemia   PSVT (paroxysmal supraventricular tachycardia) (HCC)   History of cerebrovascular accident (CVA) in adulthood  Brief hospital admission narrative: Cynthia Mclean is a 58 y.o. female with medical history significant of hypertension, seizure disorder, prior history of stroke with left hemiparesis, hyperlipidemia and GERd; who presented to ED with concerns for general malaise and near syncope event.  Symptoms presented sudden and roughly 12 hours prior to admission.  There have been complaints of decreased oral intake without specific fevers, shortness of breath, chest pain, abdominal pain, dysuria/hematuria, diarrhea, melena/hematochezia associated nausea vomiting or any other complaints.   Workup in the ED demonstrating severe hypokalemia, leukocytosis, hypomagnesemia mildly elevated troponin and a CT scan of the abdomen with concerns for generalized enterocolitis.  Fluid resuscitation and empiric antibiotics started.  Patient was found to have some SVT treated with adenosine  x 2; TRH consulted to place patient in the hospital for further evaluation and management  Assessment and Plan: 1-severe hypokalemia and hypomagnesemia - Patient reports no nausea, no vomiting, no diarrhea. - Potassium and magnesium  were  repleted/stabilized and patient discharged home on daily maintenance supplementation. - Continue to follow electrolytes trend with repeat blood work at follow-up visit. - Patient advised to maintain adequate hydration.   2-elevated troponin: -EKG and telemetry not demonstrating acute ischemic changes - Most likely in the setting of demand ischemia -2-D echo reassuring, preserved EF and not wall motion abnormalities appreciated. - Case discussed with cardiology and there was not need for ACS workup. - Continue to maintain a stable electrolytes level - Baby aspirin  81 mg recommended on daily basis.   3-enterocolitis - Incidental finding on CT abdomen - Patient reports no nausea, no vomiting, no abdominal pain - Diet has been further advanced and will follow tolerance - Continue electrolytes repletion and supportive care.   4-GERD - Continue PPI.   5-hyperlipidemia - Continue statin.   6-history of seizure disorder - Continue treatment with phenytoin  - Seizure precaution in place.   7-history of insomnia - Continue as needed trazodone  at bedtime   8-prior history of CVA with residual left hemiparesis - Continue Refacto modification and the use of a statin - Baby aspirin  on daily basis recommended at discharge - Continue adjusted dose of antihypertensive agents.  9-social/ethics - Appreciate palliative care consultation - Planning for outpatient follow-up with palliative care - Patient continued to be full code.   10-folic acid  deficiency - Folate 2.8 - Daily supplementation started.  11-hypertension - Continue adjusted dose of antihypertensive agent - Heart healthy/low-sodium diet discussed with patient.  Consultants: None Procedures performed: See below for A-fib reports Disposition: Home Diet recommendation: Heart healthy diet.  DISCHARGE MEDICATION: Allergies as of 09/04/2023   No Known Allergies      Medication List     STOP taking these medications     amLODipine  5 MG tablet Commonly known  as: NORVASC        TAKE these medications    acetaminophen  325 MG tablet Commonly known as: TYLENOL  Take 2 tablets (650 mg total) by mouth every 4 (four) hours as needed for mild pain (or temp >/= 99.5).   aspirin  EC 81 MG tablet Take 1 tablet (81 mg total) by mouth daily. Swallow whole.   atorvastatin  20 MG tablet Commonly known as: LIPITOR Take 1 tablet (20 mg total) by mouth daily.   folic acid  1 MG tablet Commonly known as: FOLVITE  Take 1 tablet (1 mg total) by mouth daily. Start taking on: Sep 05, 2023   hydrOXYzine  25 MG tablet Commonly known as: ATARAX  Take 1 tablet (25 mg total) by mouth 2 (two) times daily as needed for anxiety.   Iron  (Ferrous Sulfate ) 325 (65 Fe) MG Tabs Take 325 mg by mouth daily.   lisinopril  20 MG tablet Commonly known as: ZESTRIL  Take 1 tablet (20 mg total) by mouth daily. What changed:  medication strength how much to take   magnesium  oxide 400 (240 Mg) MG tablet Commonly known as: MagOx 400 Take 1 tablet (400 mg total) by mouth daily.   pantoprazole  40 MG tablet Commonly known as: PROTONIX  Take 1 tablet (40 mg total) by mouth daily. Start taking on: Sep 05, 2023   phenytoin  100 MG ER capsule Commonly known as: DILANTIN  Take 3 capsules (300 mg total) by mouth daily. What changed: how much to take   potassium chloride  20 MEQ/15ML (10%) Soln Take 30 mLs (40 mEq total) by mouth daily for 20 days.   traZODone  50 MG tablet Commonly known as: DESYREL  Take 0.5-1 tablets (25-50 mg total) by mouth at bedtime as needed for sleep.   UNABLE TO FIND Med Name: Adult diapers,Pads,Wipes. DX:R32        Follow-up Information     Meldon Sport, MD. Schedule an appointment as soon as possible for a visit in 10 day(s).   Specialty: Internal Medicine Contact information: [address] [place] [address] [phone]                Discharge Exam: Cynthia Mclean Weights   09/01/23  0221 09/01/23 0835  Weight: 57.6 kg 58.8 kg   General exam: Alert, awake, following commands appropriately and in no acute distress.  Patient expressed feeling ready to go home Respiratory system: Clear to auscultation. Respiratory effort normal.  Good saturation on room air. Cardiovascular system: Rate controlled, no rubs, no gallops, no JVD. Gastrointestinal system: Abdomen is nondistended, soft and nontender. No organomegaly or masses felt. Normal bowel sounds heard. Central nervous system: No new focal deficits; positive left hemiparesis appreciated. Extremities: No cyanosis or clubbing. Skin: No petechiae. Psychiatry: Mood & affect appropriate.   Condition at discharge: Stable and improved.  The results of significant diagnostics from this hospitalization (including imaging, microbiology, ancillary and laboratory) are listed below for reference.   Imaging Studies: ECHOCARDIOGRAM COMPLETE Result Date: 09/01/2023    ECHOCARDIOGRAM REPORT   Patient Name:   Cynthia Mclean Date of Exam: 09/01/2023 Medical Rec #:  [id number]   Height:       68.0 in Accession #:    [id number]  Weight:       129.6 lb Date of Birth:  1965/10/16  BSA:          1.700 m Patient Age:    57 years    BP:           127/95 mmHg Patient Gender: F  HR:           61 bpm. Exam Location:  [facility] Procedure: 2D Echo, Cardiac Doppler and Color Doppler (Both Spectral and Color            Flow Doppler were utilized during procedure). Indications:    Elevated Troponin  History:        Patient has no prior history of Echocardiogram examinations.                 Risk Factors:Hypertension.  Sonographer:    Astrid Blamer Referring Phys: [phone] Davanna He IMPRESSIONS  1. Poor Echo images.  2. Left ventricular ejection fraction, by estimation, is 60 to 65%. The left ventricle has normal function. Left ventricular endocardial border not optimally defined to evaluate regional wall motion. Left ventricular diastolic parameters were normal.   3. Right ventricular systolic function was not well visualized. The right ventricular size is not well visualized.  4. The mitral valve is normal in structure. Trivial mitral valve regurgitation. No evidence of mitral stenosis.  5. The aortic valve was not well visualized. Aortic valve regurgitation is not visualized. No aortic stenosis is present. Comparison(s): No prior Echocardiogram. FINDINGS  Left Ventricle: Left ventricular ejection fraction, by estimation, is 60 to 65%. The left ventricle has normal function. Left ventricular endocardial border not optimally defined to evaluate regional wall motion. Strain was performed and the global longitudinal strain is indeterminate. The left ventricular internal cavity size was normal in size. There is no left ventricular hypertrophy. Left ventricular diastolic parameters were normal. Right Ventricle: The right ventricular size is not well visualized. No increase in right ventricular wall thickness. Right ventricular systolic function was not well visualized. Left Atrium: Left atrial size was normal in size. Right Atrium: Right atrial size was normal in size. Pericardium: There is no evidence of pericardial effusion. Mitral Valve: The mitral valve is normal in structure. Trivial mitral valve regurgitation. No evidence of mitral valve stenosis. Tricuspid Valve: The tricuspid valve is not well visualized. Tricuspid valve regurgitation is trivial. No evidence of tricuspid stenosis. Aortic Valve: The aortic valve was not well visualized. Aortic valve regurgitation is not visualized. No aortic stenosis is present. Aortic valve mean gradient measures 2.0 mmHg. Aortic valve peak gradient measures 3.3 mmHg. Aortic valve area, by VTI measures 1.68 cm. Pulmonic Valve: The pulmonic valve was not well visualized. Pulmonic valve regurgitation is not visualized. No evidence of pulmonic stenosis. Aorta: The aortic root is normal in size and structure. Venous: The inferior vena cava  was not well visualized. IAS/Shunts: The interatrial septum was not well visualized. Additional Comments: 3D was performed not requiring image post processing on an independent workstation and was indeterminate.  LEFT VENTRICLE PLAX 2D LVIDd:         5.10 cm   Diastology LVIDs:         2.80 cm   LV e' medial:    7.07 cm/s LV PW:         0.80 cm   LV E/e' medial:  11.4 LV IVS:        0.80 cm   LV e' lateral:   6.74 cm/s LVOT diam:     1.70 cm   LV E/e' lateral: 12.0 LV SV:         41 LV SV Index:   24 LVOT Area:     2.27 cm  LEFT ATRIUM             Index  RIGHT ATRIUM          Index LA Vol (A2C):   38.8 ml 22.83 ml/m  RA Area:     9.08 cm LA Vol (A4C):   29.4 ml 17.30 ml/m  RA Volume:   16.10 ml 9.47 ml/m LA Biplane Vol: 34.1 ml 20.06 ml/m  AORTIC VALVE AV Area (Vmax):    1.74 cm AV Area (Vmean):   1.58 cm AV Area (VTI):     1.68 cm AV Vmax:           90.40 cm/s AV Vmean:          67.900 cm/s AV VTI:            0.243 m AV Peak Grad:      3.3 mmHg AV Mean Grad:      2.0 mmHg LVOT Vmax:         69.20 cm/s LVOT Vmean:        47.400 cm/s LVOT VTI:          0.180 m LVOT/AV VTI ratio: 0.74  AORTA Ao Root diam: 2.50 cm MITRAL VALVE               TRICUSPID VALVE MV Area (PHT): 3.50 cm    TR Peak grad:   22.7 mmHg MV Decel Time: 217 msec    TR Vmax:        238.00 cm/s MV E velocity: 80.60 cm/s MV A velocity: 51.00 cm/s  SHUNTS MV E/A ratio:  1.58        Systemic VTI:  0.18 m                            Systemic Diam: 1.70 cm Vishnu Priya Mallipeddi Electronically signed by Lucetta Russel Mallipeddi Signature Date/Time: 09/01/2023/3:27:23 PM    Final    CT ABDOMEN PELVIS W CONTRAST Result Date: 09/01/2023 CLINICAL DATA:  Acute, nonlocalized abdominal pain EXAM: CT ABDOMEN AND PELVIS WITH CONTRAST TECHNIQUE: Multidetector CT imaging of the abdomen and pelvis was performed using the standard protocol following bolus administration of intravenous contrast. RADIATION DOSE REDUCTION: This exam was performed according  to the departmental dose-optimization program which includes automated exposure control, adjustment of the mA and/or kV according to patient size and/or use of iterative reconstruction technique. CONTRAST:  OMNIPAQUE  IOHEXOL  300 MG/ML  SOLN COMPARISON:  05/23/2010 FINDINGS: Lower chest:  No acute finding.  Lower periesophageal varices. Hepatobiliary: Lobulated liver surface with large fissures and caudate lobe, consistent with cirrhosis. Patent main portal vein. No gross mass lesion.No evidence of biliary obstruction or stone. Pancreas: Unremarkable. Spleen: Unremarkable. Adrenals/Urinary Tract: Negative adrenals. Small right renal hilar cysts. Patchy right renal cortical scarring. Clustered calculi in the interpolar right kidney measuring up to 6 mm. Unremarkable bladder. Stomach/Bowel: Generalized submucosal low-density expansion affecting small enlarged bowel. The extent and pattern is greater than seen with portal hypertension, although this may contribute. Mild haziness of associated mesentery. Vascular/Lymphatic: Varices in the left upper quadrant, spleno renal. No mass or adenopathy. Reproductive:No pathologic findings. Other: Trace peritoneal fluid. Musculoskeletal: No acute abnormalities. Chronic L2 and L3 superior endplate fractures. Generalized osteopenia. IMPRESSION: Generalized enterocolitis. Cirrhosis with portal hypertension including varices. Right nephrolithiasis. Electronically Signed   By: Ronnette Coke M.D.   On: 09/01/2023 05:40    Microbiology: Results for orders placed or performed during the hospital encounter of 09/01/23  MRSA Next Gen by PCR, Nasal     Status: None  Collection Time: 09/01/23  8:35 AM   Specimen: Nasal Mucosa; Nasal Swab  Result Value Ref Range Status   MRSA by PCR Next Gen NOT DETECTED NOT DETECTED Final    Comment: (NOTE) The GeneXpert MRSA Assay (FDA approved for NASAL specimens only), is one component of a comprehensive MRSA colonization  surveillance program. It is not intended to diagnose MRSA infection nor to guide or monitor treatment for MRSA infections. Test performance is not FDA approved in patients less than 59 years old. Performed at [facility], [address] [place], [place] [address]     Labs: CBC: Recent Labs  Lab 09/01/23 0219 09/02/23 0612  WBC 17.1* 14.6*  NEUTROABS 11.3*  --   HGB 12.2 9.8*  HCT 36.1 27.9*  MCV 100.3* 98.2  PLT 182 174   Basic Metabolic Panel: Recent Labs  Lab 09/01/23 0219 09/01/23 1406 09/02/23 0612 09/03/23 0947 09/04/23 0357  NA 136 134* 139 136 134*  K <2.0* <2.0* 2.1* 2.5* 2.8*  CL 89* 96* 102 103 104  CO2 31 30 29 25 26   GLUCOSE 110* 130* 103* 134* 93  BUN 6 5* <5* <5* <5*  CREATININE 0.88 0.67 0.64 0.79 0.83  CALCIUM  8.1* 7.9* 7.8* 7.7* 7.6*  MG 1.5* 2.1 1.8  --  1.5*   Liver Function Tests: Recent Labs  Lab 09/01/23 0219  AST 39  ALT 12  ALKPHOS 41  BILITOT 3.4*  PROT 7.0  ALBUMIN 2.3*   CBG: Recent Labs  Lab 09/01/23 0218  GLUCAP 111*    Discharge time spent: greater than 30 minutes.  Signed: Justina Oman, MD [facility] 09/04/2023

## 2023-09-05 ENCOUNTER — Telehealth: Payer: Self-pay

## 2023-09-05 LAB — TROPONIN I (HIGH SENSITIVITY): Troponin I (High Sensitivity): 419 ng/L (ref <18)

## 2023-09-05 NOTE — Transitions of Care (Post Inpatient/ED Visit) (Signed)
 09/05/2023  Name: Cynthia Mclean MRN: [id number] DOB: [birth year]  Today's TOC FU Call Status: Today's TOC FU Call Status:: Successful TOC FU Call Completed TOC FU Call Complete Date: 09/05/23 Patient's Name and Date of Birth confirmed.  Transition Care Management Follow-up Telephone Call Date of Discharge: 09/04/23 [facility] Type of Discharge: Inpatient Admission Primary Inpatient Discharge Diagnosis:: Enterocolitis How have you been since you were released from the hospital?: Better Any questions or concerns?: No  Items Reviewed: Did you receive and understand the discharge instructions provided?: Yes Medications obtained,verified, and reconciled?: Yes (Medications Reviewed) Any new allergies since your discharge?: No Dietary orders reviewed?: NA Do you have support at home?: Yes People in Home [RPT]: child(ren), adult  Medications Reviewed Today: Medications Reviewed Today     Reviewed by Cathye Coca, LPN (Licensed Practical Nurse) on 09/05/23 at 1250  Med List Status: <None>   Medication Order Taking? Sig Documenting Provider Last Dose Status Informant  acetaminophen  (TYLENOL ) 325 MG tablet [id number] Yes Take 2 tablets (650 mg total) by mouth every 4 (four) hours as needed for mild pain (or temp >/= 99.5). Colin Dawley, MD Taking Active Child, Pharmacy Records  aspirin  EC 81 MG tablet [id number] Yes Take 1 tablet (81 mg total) by mouth daily. Swallow whole. Justina Oman, MD Taking Active   atorvastatin  (LIPITOR) 20 MG tablet [id number] No Take 1 tablet (20 mg total) by mouth daily.  Patient not taking: Reported on 09/01/2023   Meldon Sport, MD Not Taking Active Child, Pharmacy Records  folic acid  (FOLVITE ) 1 MG tablet [id number] Yes Take 1 tablet (1 mg total) by mouth daily. Justina Oman, MD Taking Active   hydrOXYzine  (ATARAX ) 25 MG tablet [id number] No Take 1 tablet (25 mg total) by mouth 2 (two) times daily as needed for anxiety.   Patient not taking: Reported on 09/01/2023   Meldon Sport, MD Not Taking Active Child, Pharmacy Records  Iron , Ferrous Sulfate , 325 (65 Fe) MG TABS [id number] No Take 325 mg by mouth daily.  Patient not taking: Reported on 09/01/2023   Meldon Sport, MD Not Taking Active Child, Pharmacy Records  lisinopril  (ZESTRIL ) 20 MG tablet [id number] Yes Take 1 tablet (20 mg total) by mouth daily. Justina Oman, MD Taking Active   magnesium  oxide (MAGOX 400) 400 (240 Mg) MG tablet [id number] Yes Take 1 tablet (400 mg total) by mouth daily. Justina Oman, MD Taking Active   pantoprazole  (PROTONIX ) 40 MG tablet [id number] Yes Take 1 tablet (40 mg total) by mouth daily. Justina Oman, MD Taking Active   phenytoin  (DILANTIN ) 100 MG ER capsule [id number] Yes Take 3 capsules (300 mg total) by mouth daily.  Patient taking differently: Take 200 mg by mouth daily.   Meldon Sport, MD Taking Active Child, Pharmacy Records  potassium chloride  20 MEQ/15ML (10%) SOLN [id number] Yes Take 30 mLs (40 mEq total) by mouth daily for 20 days. Justina Oman, MD Taking Active   traZODone  (DESYREL ) 50 MG tablet [id number] Yes Take 0.5-1 tablets (25-50 mg total) by mouth at bedtime as needed for sleep. Meldon Sport, MD Taking Active Child, Pharmacy Records  UNABLE TO FIND [id number] Yes Med Name: Adult diapers,Pads,Wipes. DX:R32 Meldon Sport, MD Taking Active Child, Pharmacy Records            Home Care and Equipment/Supplies: Were Home Health Services Ordered?: Yes Name of Home Health Agency:: [facility] Has Agency set up a time to come to your home?: No Any  new equipment or medical supplies ordered?: NA  Functional Questionnaire: Do you need assistance with bathing/showering or dressing?: No Do you need assistance with meal preparation?: No Do you need assistance with eating?: No Do you have difficulty maintaining continence: No Do you need assistance with getting out of bed/getting out of a chair/moving?: Yes Do  you have difficulty managing or taking your medications?: No  Follow up appointments reviewed: PCP Follow-up appointment confirmed?: NA (Patient declined) Specialist Hospital Follow-up appointment confirmed?: NA Do you need transportation to your follow-up appointment?: No Do you understand care options if your condition(s) worsen?: Yes-patient verbalized understanding    SIGNATURE [place]ron [place], LPN [facility] Health Advisor [facility] You Are. We Are. [facility] Direct Dial [phone]

## 2023-09-09 ENCOUNTER — Other Ambulatory Visit: Payer: Self-pay | Admitting: Internal Medicine

## 2023-09-09 ENCOUNTER — Telehealth: Payer: Self-pay

## 2023-09-09 DIAGNOSIS — G8194 Hemiplegia, unspecified affecting left nondominant side: Secondary | ICD-10-CM

## 2023-09-09 NOTE — Telephone Encounter (Signed)
 Spoke to [place] to approve orders , states pt needs a wheelchair order.

## 2023-09-09 NOTE — Telephone Encounter (Signed)
 Copied from CRM [id number]. Topic: Clinical - Home Health Verbal Orders >> Sep 09, 2023  2:06 PM Elle L wrote: Caller/Agency: Wyn Heater Physical Therapist with [facility] Callback Number: [phone] Service Requested: Physical Therapy Frequency: 1 week 1 2 week 2 1 week 3 Any new concerns about the patient? Yes, the patient had a recent hospitalization and will need a hospital follow-up as they changed some of her medications. He also states she needs a wheel chair that fits her as she is borrowing one from a friend that is too large for her and it is affecting her mobility.

## 2023-09-12 NOTE — Telephone Encounter (Signed)
Pt scheduled on 05/30

## 2023-09-14 LAB — GLUCOSE, CAPILLARY: Glucose-Capillary: 106 mg/dL — ABNORMAL HIGH (ref 70–99)

## 2023-09-23 ENCOUNTER — Ambulatory Visit: Admitting: Internal Medicine

## 2023-09-28 ENCOUNTER — Ambulatory Visit: Payer: Self-pay

## 2023-09-28 NOTE — Telephone Encounter (Signed)
 FYI Only or Action Required?: FYI only for provider  Patient was last seen in primary care on 05/09/2023 by Meldon Sport, MD. Called Nurse Triage reporting Altered Mental Status. Symptoms began today. Interventions attempted: Nothing. Symptoms are: unchanged.  Triage Disposition: See HCP Within 4 Hours (Or PCP Triage)advised to go to ED NOW  Patient/caregiver understands and will follow disposition?: Yes           Copied from CRM [id number]. Topic: Clinical - Red Word Triage >> Sep 28, 2023 12:21 PM Fredrica W wrote: Red Word that prompted transfer to Nurse Triage: Talking crazy, not normal , confused, mental state Reason for Disposition  [1] Acting confused (e.g., disoriented, slurred speech) AND [2] brief (now gone)  Answer Assessment - Initial Assessment Questions 1. LEVEL OF CONSCIOUSNESS: "How is he (she, the patient) acting right now?" (e.g., alert-oriented, confused, lethargic, stuporous, comatose)     Confused, oriented to self and time, but confused about reality, halucinating 2. ONSET: "When did the confusion start?"  (minutes, hours, days)     One hour ago 3. PATTERN "Does this come and go, or has it been constant since it started?"  "Is it present now?"     Present now, constant since it began one hour ago 4. ALCOHOL or DRUGS: "Has he been drinking alcohol or taking any drugs?"      no 5. NARCOTIC MEDICINES: "Has he been receiving any narcotic medications?" (e.g., morphine, Vicodin)     no 6. CAUSE: "What do you think is causing the confusion?"      no 7. OTHER SYMPTOMS: "Are there any other symptoms?" (e.g., difficulty breathing, headache, fever, weakness)     No   New Onset Confusion/hallucinations.  Protocols used: Confusion - Delirium-A-AH

## 2023-09-29 ENCOUNTER — Emergency Department (HOSPITAL_COMMUNITY)

## 2023-09-29 ENCOUNTER — Inpatient Hospital Stay (HOSPITAL_COMMUNITY)
Admission: EM | Admit: 2023-09-29 | Discharge: 2023-10-04 | DRG: 689 | Disposition: A | Discharge status: Home Health | Attending: Internal Medicine | Admitting: Internal Medicine

## 2023-09-29 ENCOUNTER — Other Ambulatory Visit: Payer: Self-pay

## 2023-09-29 ENCOUNTER — Encounter (HOSPITAL_COMMUNITY): Payer: Self-pay | Admitting: Emergency Medicine

## 2023-09-29 DIAGNOSIS — N3000 Acute cystitis without hematuria: Secondary | ICD-10-CM

## 2023-09-29 DIAGNOSIS — N390 Urinary tract infection, site not specified: Secondary | ICD-10-CM | POA: Diagnosis not present

## 2023-09-29 DIAGNOSIS — G9341 Metabolic encephalopathy: Secondary | ICD-10-CM | POA: Diagnosis present

## 2023-09-29 DIAGNOSIS — Z8673 Personal history of transient ischemic attack (TIA), and cerebral infarction without residual deficits: Secondary | ICD-10-CM | POA: Diagnosis not present

## 2023-09-29 DIAGNOSIS — K219 Gastro-esophageal reflux disease without esophagitis: Secondary | ICD-10-CM | POA: Diagnosis present

## 2023-09-29 DIAGNOSIS — D539 Nutritional anemia, unspecified: Secondary | ICD-10-CM | POA: Diagnosis present

## 2023-09-29 DIAGNOSIS — G40909 Epilepsy, unspecified, not intractable, without status epilepticus: Secondary | ICD-10-CM | POA: Diagnosis present

## 2023-09-29 DIAGNOSIS — Z7982 Long term (current) use of aspirin: Secondary | ICD-10-CM

## 2023-09-29 DIAGNOSIS — L89152 Pressure ulcer of sacral region, stage 2: Secondary | ICD-10-CM | POA: Diagnosis present

## 2023-09-29 DIAGNOSIS — R4182 Altered mental status, unspecified: Principal | ICD-10-CM | POA: Diagnosis present

## 2023-09-29 DIAGNOSIS — E860 Dehydration: Secondary | ICD-10-CM | POA: Diagnosis present

## 2023-09-29 DIAGNOSIS — E782 Mixed hyperlipidemia: Secondary | ICD-10-CM | POA: Diagnosis present

## 2023-09-29 DIAGNOSIS — I10 Essential (primary) hypertension: Secondary | ICD-10-CM | POA: Diagnosis present

## 2023-09-29 DIAGNOSIS — I69354 Hemiplegia and hemiparesis following cerebral infarction affecting left non-dominant side: Secondary | ICD-10-CM

## 2023-09-29 DIAGNOSIS — Z79899 Other long term (current) drug therapy: Secondary | ICD-10-CM

## 2023-09-29 DIAGNOSIS — Y900 Blood alcohol level of less than 20 mg/100 ml: Secondary | ICD-10-CM | POA: Diagnosis present

## 2023-09-29 DIAGNOSIS — F17290 Nicotine dependence, other tobacco product, uncomplicated: Secondary | ICD-10-CM | POA: Diagnosis present

## 2023-09-29 DIAGNOSIS — E7220 Disorder of urea cycle metabolism, unspecified: Secondary | ICD-10-CM | POA: Diagnosis present

## 2023-09-29 DIAGNOSIS — F1090 Alcohol use, unspecified, uncomplicated: Secondary | ICD-10-CM | POA: Diagnosis present

## 2023-09-29 DIAGNOSIS — E871 Hypo-osmolality and hyponatremia: Secondary | ICD-10-CM | POA: Diagnosis present

## 2023-09-29 DIAGNOSIS — R569 Unspecified convulsions: Secondary | ICD-10-CM

## 2023-09-29 DIAGNOSIS — E876 Hypokalemia: Secondary | ICD-10-CM | POA: Diagnosis present

## 2023-09-29 LAB — CBG MONITORING, ED: Glucose-Capillary: 106 mg/dL — ABNORMAL HIGH (ref 70–99)

## 2023-09-29 LAB — URINALYSIS, ROUTINE W REFLEX MICROSCOPIC
Bilirubin Urine: NEGATIVE
Glucose, UA: NEGATIVE mg/dL
Ketones, ur: NEGATIVE mg/dL
Leukocytes,Ua: NEGATIVE
Nitrite: POSITIVE — AB
Protein, ur: NEGATIVE mg/dL
Specific Gravity, Urine: 1.012 (ref 1.005–1.030)
pH: 5 (ref 5.0–8.0)

## 2023-09-29 LAB — RAPID URINE DRUG SCREEN, HOSP PERFORMED
Amphetamines: NOT DETECTED
Barbiturates: NOT DETECTED
Benzodiazepines: NOT DETECTED
Cocaine: NOT DETECTED
Opiates: NOT DETECTED
Tetrahydrocannabinol: NOT DETECTED

## 2023-09-29 LAB — CBC
HCT: 28.2 % — ABNORMAL LOW (ref 36.0–46.0)
Hemoglobin: 9.4 g/dL — ABNORMAL LOW (ref 12.0–15.0)
MCH: 33.3 pg (ref 26.0–34.0)
MCHC: 33.3 g/dL (ref 30.0–36.0)
MCV: 100 fL (ref 80.0–100.0)
Platelets: 149 10*3/uL — ABNORMAL LOW (ref 150–400)
RBC: 2.82 MIL/uL — ABNORMAL LOW (ref 3.87–5.11)
RDW: 15.3 % (ref 11.5–15.5)
WBC: 7.8 10*3/uL (ref 4.0–10.5)
nRBC: 0 % (ref 0.0–0.2)

## 2023-09-29 LAB — COMPREHENSIVE METABOLIC PANEL WITH GFR
ALT: 17 U/L (ref 0–44)
AST: 40 U/L (ref 15–41)
Albumin: 2.3 g/dL — ABNORMAL LOW (ref 3.5–5.0)
Alkaline Phosphatase: 46 U/L (ref 38–126)
Anion gap: 7 (ref 5–15)
BUN: 6 mg/dL (ref 6–20)
CO2: 23 mmol/L (ref 22–32)
Calcium: 8 mg/dL — ABNORMAL LOW (ref 8.9–10.3)
Chloride: 105 mmol/L (ref 98–111)
Creatinine, Ser: 0.76 mg/dL (ref 0.44–1.00)
GFR, Estimated: >60 mL/min (ref >60)
Glucose, Bld: 114 mg/dL — ABNORMAL HIGH (ref 70–99)
Potassium: 3.2 mmol/L — ABNORMAL LOW (ref 3.5–5.1)
Sodium: 135 mmol/L (ref 135–145)
Total Bilirubin: 1.2 mg/dL (ref 0.0–1.2)
Total Protein: 7 g/dL (ref 6.5–8.1)

## 2023-09-29 LAB — VITAMIN B12: Vitamin B-12: 574 pg/mL (ref 180–914)

## 2023-09-29 LAB — CK: Total CK: 53 U/L (ref 38–234)

## 2023-09-29 LAB — ETHANOL: Alcohol, Ethyl (B): <15 mg/dL (ref <15)

## 2023-09-29 LAB — AMMONIA: Ammonia: 80 umol/L — ABNORMAL HIGH (ref 9–35)

## 2023-09-29 LAB — TSH: TSH: 2.826 u[IU]/mL (ref 0.350–4.500)

## 2023-09-29 MED ORDER — ATORVASTATIN CALCIUM 20 MG PO TABS
20.0000 mg | ORAL_TABLET | Freq: Every day | ORAL | Status: DC
  Administered 2023-09-29 – 2023-10-04 (×6): 20 mg via ORAL
  Filled 2023-09-29 (×7): qty 1

## 2023-09-29 MED ORDER — LISINOPRIL 10 MG PO TABS
20.0000 mg | ORAL_TABLET | Freq: Every day | ORAL | Status: DC
  Administered 2023-09-29 – 2023-10-04 (×6): 20 mg via ORAL
  Filled 2023-09-29 (×6): qty 2

## 2023-09-29 MED ORDER — ONDANSETRON HCL 4 MG/2ML IJ SOLN: 4.0000 mg | Freq: Four times a day (QID) | INTRAMUSCULAR | Status: DC | PRN

## 2023-09-29 MED ORDER — PANTOPRAZOLE SODIUM 40 MG PO TBEC
40.0000 mg | DELAYED_RELEASE_TABLET | Freq: Every day | ORAL | Status: DC
  Administered 2023-09-29 – 2023-10-04 (×6): 40 mg via ORAL
  Filled 2023-09-29 (×6): qty 1

## 2023-09-29 MED ORDER — ASPIRIN 81 MG PO TBEC
81.0000 mg | DELAYED_RELEASE_TABLET | Freq: Every day | ORAL | Status: DC
  Administered 2023-09-29 – 2023-10-04 (×6): 81 mg via ORAL
  Filled 2023-09-29 (×6): qty 1

## 2023-09-29 MED ORDER — POTASSIUM CHLORIDE 20 MEQ PO PACK
40.0000 meq | PACK | Freq: Every day | ORAL | Status: DC
  Administered 2023-09-29 – 2023-10-01 (×3): 40 meq via ORAL
  Filled 2023-09-29 (×3): qty 2

## 2023-09-29 MED ORDER — ONDANSETRON HCL 4 MG PO TABS: 4.0000 mg | ORAL_TABLET | Freq: Four times a day (QID) | ORAL | Status: DC | PRN

## 2023-09-29 MED ORDER — MAGNESIUM OXIDE -MG SUPPLEMENT 400 (240 MG) MG PO TABS
400.0000 mg | ORAL_TABLET | Freq: Every day | ORAL | Status: DC
  Administered 2023-09-30 – 2023-10-04 (×5): 400 mg via ORAL
  Filled 2023-09-29 (×5): qty 1

## 2023-09-29 MED ORDER — FOLIC ACID 1 MG PO TABS
1.0000 mg | ORAL_TABLET | Freq: Every day | ORAL | Status: DC
  Administered 2023-09-30 – 2023-10-04 (×5): 1 mg via ORAL
  Filled 2023-09-29 (×5): qty 1

## 2023-09-29 MED ORDER — PHENYTOIN SODIUM EXTENDED 100 MG PO CAPS
200.0000 mg | ORAL_CAPSULE | Freq: Every day | ORAL | Status: DC
  Administered 2023-09-29 – 2023-09-30 (×2): 200 mg via ORAL
  Filled 2023-09-29 (×3): qty 2

## 2023-09-29 MED ORDER — SODIUM CHLORIDE 0.9 % IV SOLN
1.0000 g | Freq: Once | INTRAVENOUS | Status: AC
  Administered 2023-09-29: 1 g via INTRAVENOUS
  Filled 2023-09-29: qty 10

## 2023-09-29 MED ORDER — SODIUM CHLORIDE 0.9 % IV SOLN: INTRAVENOUS | Status: AC

## 2023-09-29 MED ORDER — ACETAMINOPHEN 650 MG RE SUPP: 650.0000 mg | Freq: Four times a day (QID) | RECTAL | Status: DC | PRN

## 2023-09-29 MED ORDER — ACETAMINOPHEN 325 MG PO TABS
650.0000 mg | ORAL_TABLET | Freq: Four times a day (QID) | ORAL | Status: DC | PRN
  Administered 2023-09-30 – 2023-10-03 (×5): 650 mg via ORAL
  Filled 2023-09-29 (×5): qty 2

## 2023-09-29 NOTE — Progress Notes (Addendum)
 Spoke with Daughter Laretta Pleasure. Daughter confirmed that patient has not taken her day meds today. Updated on patient status.

## 2023-09-29 NOTE — ED Provider Notes (Signed)
 [place] EMERGENCY DEPARTMENT AT [facility] Provider Note   CSN: [id number] Arrival date & time: 09/29/23  4098     History  Chief Complaint  Patient presents with   Altered Mental Status    Cynthia Mclean is a 58 y.o. female with PMH as listed below who presents via [facility] for AMS. HX CVA and seizures. Lives with daughter and was found by family crawling on the floor this AM. CBG 126 vis EMS. Patient denies any pain but is apparently TTP to left hip. Follows commands and responds to questions but is only oriented to place and self. Level 5 caveat.   Past Medical History:  Diagnosis Date   Alcohol dependency (HCC)    HELICOBACTER PYLORI [H. PYLORI] INFECTION 02/24/2010   Qualifier: Diagnosis of  By: Artelia Laroche.    Hypertension    IDA (iron  deficiency anemia)    required blood tranfusion   Nicotine addiction    Seizure disorder (HCC)        Home Medications Prior to Admission medications   Medication Sig Start Date End Date Taking? Authorizing Provider  acetaminophen  (TYLENOL ) 325 MG tablet Take 2 tablets (650 mg total) by mouth every 4 (four) hours as needed for mild pain (or temp >/= 99.5). 03/13/20   Colin Dawley, MD  aspirin  EC 81 MG tablet Take 1 tablet (81 mg total) by mouth daily. Swallow whole. 09/04/23   Justina Oman, MD  atorvastatin  (LIPITOR) 20 MG tablet Take 1 tablet (20 mg total) by mouth daily. Patient not taking: Reported on 09/01/2023 05/09/23   Meldon Sport, MD  folic acid  (FOLVITE ) 1 MG tablet Take 1 tablet (1 mg total) by mouth daily. 09/05/23   Justina Oman, MD  hydrOXYzine  (ATARAX ) 25 MG tablet Take 1 tablet (25 mg total) by mouth 2 (two) times daily as needed for anxiety. Patient not taking: Reported on 09/01/2023 08/25/21   Meldon Sport, MD  Iron , Ferrous Sulfate , 325 (65 Fe) MG TABS Take 325 mg by mouth daily. Patient not taking: Reported on 09/01/2023 08/25/21   Meldon Sport, MD  lisinopril  (ZESTRIL ) 20 MG tablet Take 1 tablet  (20 mg total) by mouth daily. 09/04/23   Justina Oman, MD  magnesium  oxide (MAGOX 400) 400 (240 Mg) MG tablet Take 1 tablet (400 mg total) by mouth daily. 09/04/23   Justina Oman, MD  pantoprazole  (PROTONIX ) 40 MG tablet Take 1 tablet (40 mg total) by mouth daily. 09/05/23   Justina Oman, MD  phenytoin  (DILANTIN ) 100 MG ER capsule Take 3 capsules (300 mg total) by mouth daily. Patient taking differently: Take 200 mg by mouth daily. 05/09/23   Meldon Sport, MD  potassium chloride  20 MEQ/15ML (10%) SOLN Take 30 mLs (40 mEq total) by mouth daily for 20 days. 09/04/23 09/24/23  Justina Oman, MD  traZODone  (DESYREL ) 50 MG tablet Take 0.5-1 tablets (25-50 mg total) by mouth at bedtime as needed for sleep. 05/09/23   Meldon Sport, MD  UNABLE TO FIND Med Name: Adult diapers,Pads,Wipes. DX:R32 11/24/21   Meldon Sport, MD      Allergies    Patient has no known allergies.    Review of Systems   Review of Systems A 10 point review of systems was performed and is negative unless otherwise reported in HPI.  Physical Exam Updated Vital Signs BP (!) 140/63   Pulse 75   Temp 98 F (36.7 C) (Oral)   Resp 12   LMP  05/14/2013   SpO2 99%  Physical Exam General: Normal appearing elderly female, lying in bed.  HEENT: NCAT, PERRLA, EOMI, Sclera anicteric, MMM, trachea midline.  Cardiology: RRR, no murmurs/rubs/gallops. No chest wall TTP. Resp: Normal respiratory rate and effort. CTAB, no wheezes, rhonchi, crackles.  Abd: Soft, non-tender, non-distended. No rebound tenderness or guarding.  Pelvis: pelvis stable, apparently tender on left hip/left greater trochanter. No gross abnormalities or e/o trauma.  MSK: No peripheral edema or signs of trauma. Extremities without deformity. TTP noted to left femur/left hip. No cyanosis or clubbing. Skin: warm, dry. Back: No CVA tenderness. No midline C, T or L spine TTP/deformiteis or stepoffs.  Neuro: A&Ox2, CNs II-XII grossly intact. 5/5 strength all  extremities. Sensation grossly intact.   ED Results / Procedures / Treatments   Labs (all labs ordered are listed, but only abnormal results are displayed) Labs Reviewed  CBG MONITORING, ED - Abnormal; Notable for the following components:      Result Value   Glucose-Capillary 106 (*)    All other components within normal limits  URINE CULTURE  COMPREHENSIVE METABOLIC PANEL WITH GFR  CBC  URINALYSIS, ROUTINE W REFLEX MICROSCOPIC  CK    EKG EKG Interpretation Date/Time:  Thursday September 29 2023 07:36:48 EDT Ventricular Rate:  76 PR Interval:  134 QRS Duration:  89 QT Interval:  366 QTC Calculation: 412 R Axis:   50  Text Interpretation: Sinus rhythm Nonspecific T abnormalities, diffuse leads Confirmed by Annita Kindle [phone]) on 09/29/2023 7:45:32 AM  Radiology CTH: 1. No acute intracranial abnormalities. 2. 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  CXR: No active disease.   PXR: Negative   DG L femur: Negative   Procedures Procedures    Medications Ordered in ED Medications  cefTRIAXone  (ROCEPHIN ) 1 g in sodium chloride  0.9 % 100 mL IVPB (0 g Intravenous Stopped 09/29/23 1248)    ED Course/ Medical Decision Making/ A&P                          Medical Decision Making Amount and/or Complexity of Data Reviewed Labs: ordered. Decision-making details documented in ED Course. Radiology: ordered. Decision-making details documented in ED Course.  Risk Decision regarding hospitalization.    This patient presents to the ED for concern of AMS, crawling on floor; this involves an extensive number of treatment options, and is a complaint that carries with it a high risk of complications and morbidity.  I considered the following differential and admission for this acute, potentially life threatening condition.   MDM:    Ddx of acute altered mental status or encephalopathy considered but not limited to: -Intracranial abnormalities such as ICH, hydrocephalus, head  trauma - no e/o head trauma on exam, reassuringly CTH neg for injuries -Infection such as UTI, PNA - UA by in and out cath with +nitrites and +c/o infection and CXR clear of PNA -Pt seems to have no focal neuro deficits at this time but considered acute CVA. Unknown LKN, likely last night, no indication for stroke code -Does have h/o seizures, EMS had stated that family had said possible blood in mouth but none noted now, consider possible post-ictal state  -Toxic ingestion such as polypharmacy. Neg drug screen.  -No significant electrolyte abnormalities or hyper/hypoglycemia -No e/o ACS or arrhythmia   Clinical Course as of 10/04/23 1926  Thu Sep 29, 2023  0825 Glucose-Capillary(!): 106 [HN]  0842 Hemoglobin(!): 9.4 Down from 9.8 three weeks ago and 12.2 four  weeks ago [HN]  0842 WBC: 7.8 No leukocytosis  [HN]  0903 CK Total: 53 neg [HN]  0904 Potassium(!): 3.2 Improving hypokalemia, otherwise unremarkable CMP [HN]  0904 Urinalysis, Routine w reflex microscopic -Urine, Catheterized(!) +nitrites but no WBCs, must consider UTI in s/o AMS [HN]  0904 CT Head Wo Contrast 1. No acute intracranial abnormalities. 2. 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   [HN]  0931 Rapid urine drug screen (hospital performed) neg [HN]  1040 DG Femur Min 2 Views Left Negative [HN]  1040 DG Pelvis 1-2 Views Negative [HN]  1040 DG Chest Portable 1 View No active disease. [HN]  1045 Alcohol, Ethyl (B): <15 neg [HN]  1053 Ammonia(!): 80 Elevated ammonia; takes phenytoin  which could account for this Per Dr. Michaelene Admire [HN]    Clinical Course User Index [HN] Merdis Stalling, MD    Will treat for UTI w/ AMS. Will give ceftriaxone  and admit to medicine.   Labs: I Ordered, and personally interpreted labs.  The pertinent results include:  those listed above  Imaging Studies ordered: I ordered imaging studies including CTH, CXR, PXR, LLE XRs I independently visualized and interpreted imaging. I  agree with the radiologist interpretation  Additional history obtained from EMS, chart review.  Cardiac Monitoring: The patient was maintained on a cardiac monitor.  I personally viewed and interpreted the cardiac monitored which showed an underlying rhythm of: NSR  Reevaluation: After the interventions noted above, I reevaluated the patient and found that they have :stayed the same  Social Determinants of Health: Lives with daughter  Disposition:  admit to medicine  Co morbidities that complicate the patient evaluation  Past Medical History:  Diagnosis Date   Alcohol dependency (HCC)    HELICOBACTER PYLORI [H. PYLORI] INFECTION 02/24/2010   Qualifier: Diagnosis of  By: Artelia Laroche.    Hypertension    IDA (iron  deficiency anemia)    required blood tranfusion   Nicotine addiction    Seizure disorder (HCC)      Medicines No orders of the defined types were placed in this encounter.   I have reviewed the patients home medicines and have made adjustments as needed  Problem List / ED Course: Problem List Items Addressed This Visit       Other   * (Principal) AMS (altered mental status) - Primary   Relevant Orders   Ambulatory referral to Neurology                This note was created using dictation software, which may contain spelling or grammatical errors.    Merdis Stalling, MD 10/04/23 [phone]

## 2023-09-29 NOTE — Progress Notes (Signed)
   09/29/23 2040  TOC Brief Assessment  Insurance and Status Reviewed  Patient has primary care physician Yes  Home environment has been reviewed From home c/family  Prior level of function: Assisted, non-ambulatory  Prior/Current Home Services Current home services ([facility])  Social Drivers of Health Review SDOH reviewed interventions complete (Smoking Tobacco Information, Adult added to AVS)  Readmission risk has been reviewed Yes  Transition of care needs no transition of care needs at this time   Transition of Care Department [facility]) has reviewed patient and no TOC needs have been identified at this time. We will continue to monitor patient advancement through interdisciplinary progression rounds. If new patient transition needs arise, please place a TOC consult.

## 2023-09-29 NOTE — ED Notes (Signed)
 Pt just returned from ct. Pt cleaned, was soaked with urine.

## 2023-09-29 NOTE — ED Triage Notes (Signed)
 Patient brought into ed via [facility] for AMS and UTI complaints. HX CVA ans seizures. Lives with daughter and was found by family on the floor this AM. Alert oriented to place and self. CBG 126 vis EMS.

## 2023-09-29 NOTE — Care Management Obs Status (Signed)
 MEDICARE OBSERVATION STATUS NOTIFICATION   Patient Details  Name: Cynthia Mclean MRN: [id number] Date of Birth: 01/14/1966   Medicare Observation Status Notification Given:  Yes    Geraldina Klinefelter, RN 09/29/2023, 7:39 PM

## 2023-09-29 NOTE — ED Notes (Signed)
 Pt had small amount of stool, pt cleaned and new chuck placed.

## 2023-09-29 NOTE — H&P (Addendum)
 History and Physical    Patient: Cynthia Mclean ZOX:[id number] DOB: [birth year] DOA: 09/29/2023 DOS: the patient was seen and examined on 09/29/2023 PCP: Meldon Sport, MD   Patient coming from: Home  Chief Complaint:  Chief Complaint  Patient presents with   Altered Mental Status   HPI: SHRESHTA MEDLEY is a 58 y.o. female with medical history significant of history of seizure disorders, prior CVA, hypertension, hyperlipidemia, GERD and hypokalemia; who presented to the hospital after she was found on the floor by her daughter and noticing altered mentation. Patient reports no dysuria but has present frequency; there was some information reported to EDP regarding seeing some blood in her mouth at home; none seen here.  No fever, no chest pain, no shortness of breath, no nausea, no vomiting, no sick contacts or any other complaints has been reported.  CT scan of the head demonstrated no acute intracranial normalities; for the most part stable blood work except for mild hypokalemia (3.2).Aaron Aas  Urinalysis with positive nitrite and positive bacteria.  Patient's ammonia level around 80.   Cultures taken, Rocephin  started and TRH contacted to place patient in the hospital for further evaluation and management.   Review of Systems: As mentioned in the history of present illness. All other systems reviewed and are negative. Past Medical History:  Diagnosis Date   Alcohol dependency (HCC)    HELICOBACTER PYLORI [H. PYLORI] INFECTION 02/24/2010   Qualifier: Diagnosis of  By: Artelia Laroche.    Hypertension    IDA (iron  deficiency anemia)    required blood tranfusion   Nicotine addiction    Seizure disorder [facility])    Past Surgical History:  Procedure Laterality Date   btl     left knee surgery, fracture     Social History:  reports that she has never smoked. Her smokeless tobacco use includes snuff. She reports that she does not currently use alcohol. She reports that she does not use  drugs.  No Known Allergies  Family History  Problem Relation Age of Onset   Anxiety disorder Sister     Prior to Admission medications   Medication Sig Start Date End Date Taking? Authorizing Provider  aspirin  EC 81 MG tablet Take 1 tablet (81 mg total) by mouth daily. Swallow whole. 09/04/23  Yes Justina Oman, MD  folic acid  (FOLVITE ) 1 MG tablet Take 1 tablet (1 mg total) by mouth daily. 09/05/23  Yes Justina Oman, MD  lisinopril  (ZESTRIL ) 20 MG tablet Take 1 tablet (20 mg total) by mouth daily. 09/04/23  Yes Justina Oman, MD  magnesium  oxide (MAGOX 400) 400 (240 Mg) MG tablet Take 1 tablet (400 mg total) by mouth daily. 09/04/23  Yes Justina Oman, MD  pantoprazole  (PROTONIX ) 40 MG tablet Take 1 tablet (40 mg total) by mouth daily. 09/05/23  Yes Justina Oman, MD  phenytoin  (DILANTIN ) 100 MG ER capsule Take 3 capsules (300 mg total) by mouth daily. Patient taking differently: Take 200 mg by mouth daily. 05/09/23  Yes Meldon Sport, MD  potassium chloride  20 MEQ/15ML (10%) SOLN Take 30 mLs (40 mEq total) by mouth daily for 20 days. 09/04/23 09/29/23 Yes Justina Oman, MD  atorvastatin  (LIPITOR) 20 MG tablet Take 1 tablet (20 mg total) by mouth daily. Patient not taking: Reported on 09/01/2023 05/09/23   Meldon Sport, MD  UNABLE TO FIND Med Name: Adult diapers,Pads,Wipes. DX:R32 11/24/21   Meldon Sport, MD    Physical Exam: Vitals:   09/29/23 1145  09/29/23 1200 09/29/23 1215 09/29/23 1330  BP: (!) 125/57 (!) 146/73  131/63  Pulse: 67 75  71  Resp: 11 12  19   Temp:   97.9 F (36.6 C) 97.9 F (36.6 C)  TempSrc:   Oral Oral  SpO2: 99% 98%  100%  Weight:    54.9 kg  Height:    5\' 6"  (1.676 m)   General exam: Alert, awake, oriented x 2 at time of evaluation; no major distress appreciated. Respiratory system: Clear to auscultation. Respiratory effort normal.  Good saturation on room air. Cardiovascular system:RRR. No rubs or gallops; no JVD. Gastrointestinal system: Abdomen is  nondistended, soft and nontender. No organomegaly or masses felt. Normal bowel sounds heard. Central nervous system: Moving 4 limbs spontaneously.  No focal neurological deficits. Extremities: No cyanosis or clubbing. Skin: No petechiae. Psychiatry:  Mood & affect appropriate.   Data Reviewed: Comprehensive metabolic panel: Sodium 135, potassium 3.2, chloride 105, bicarb 23, BUN 6, creatinine 0.76, normal LFTs and GFR >60 CBC: WBC 7.8, hemoglobin 9.4 and platelet count 149K CK: 53 Ammonia: 80 UDS: Negative  Assessment and Plan: 1-altered mental status - Appears to be acute metabolic encephalopathy in the setting of UTI - Follow culture results and continue empiric antibiotics - Maintain adequate hydration and continue supportive care. -CT scan negative for acute intracranial normalities - If patient mentation fail to continue improvement will require MRI evaluation. - TSH and B12 will be ordered to be thorough. - Telemetry has been ordered to rule out any arrhythmia component.  2-hypertension - Resume home antihypertensive agents - Heart healthy diet has been ordered.  3-GERD - Continue PPI  4-history of hypokalemia - Continue daily supplementation - Follow electrolytes trend.  5-hyperlipidemia - Continue statin  6-prior history of stroke - Continue risk factor modification - Continue aspirin  and statin for secondary prevention.  7-hyperammonemia - In the setting of Dilantin  usage most likely - Acute phase reactant from acute infection also possible - No prior history of liver disease - Normal LFTs - Continue supportive management and current plan of care.  8-seizure disorder - Continue home antiepileptic drugs regimen.   Advance Care Planning:   Code Status: Full Code   Consults: None  Family Communication: No family at bedside.  Severity of Illness: The appropriate patient status for this patient is OBSERVATION. Observation status is judged to be reasonable  and necessary in order to provide the required intensity of service to ensure the patient's safety. The patient's presenting symptoms, physical exam findings, and initial radiographic and laboratory data in the context of their medical condition is felt to place them at decreased risk for further clinical deterioration. Furthermore, it is anticipated that the patient will be medically stable for discharge from the hospital within 2 midnights of admission.   Author: Justina Oman, MD 09/29/2023 5:04 PM  For on call review [email or web address].

## 2023-09-30 DIAGNOSIS — G9341 Metabolic encephalopathy: Secondary | ICD-10-CM | POA: Diagnosis present

## 2023-09-30 DIAGNOSIS — I69354 Hemiplegia and hemiparesis following cerebral infarction affecting left non-dominant side: Secondary | ICD-10-CM | POA: Diagnosis not present

## 2023-09-30 DIAGNOSIS — R4182 Altered mental status, unspecified: Secondary | ICD-10-CM | POA: Diagnosis present

## 2023-09-30 DIAGNOSIS — Z7982 Long term (current) use of aspirin: Secondary | ICD-10-CM | POA: Diagnosis not present

## 2023-09-30 DIAGNOSIS — E876 Hypokalemia: Secondary | ICD-10-CM | POA: Diagnosis present

## 2023-09-30 DIAGNOSIS — R569 Unspecified convulsions: Secondary | ICD-10-CM | POA: Diagnosis not present

## 2023-09-30 DIAGNOSIS — Y900 Blood alcohol level of less than 20 mg/100 ml: Secondary | ICD-10-CM | POA: Diagnosis present

## 2023-09-30 DIAGNOSIS — G40909 Epilepsy, unspecified, not intractable, without status epilepticus: Secondary | ICD-10-CM | POA: Diagnosis present

## 2023-09-30 DIAGNOSIS — E782 Mixed hyperlipidemia: Secondary | ICD-10-CM | POA: Diagnosis present

## 2023-09-30 DIAGNOSIS — F17290 Nicotine dependence, other tobacco product, uncomplicated: Secondary | ICD-10-CM | POA: Diagnosis present

## 2023-09-30 DIAGNOSIS — R7889 Finding of other specified substances, not normally found in blood: Secondary | ICD-10-CM | POA: Diagnosis not present

## 2023-09-30 DIAGNOSIS — N390 Urinary tract infection, site not specified: Secondary | ICD-10-CM | POA: Diagnosis present

## 2023-09-30 DIAGNOSIS — I10 Essential (primary) hypertension: Secondary | ICD-10-CM | POA: Diagnosis present

## 2023-09-30 DIAGNOSIS — L89152 Pressure ulcer of sacral region, stage 2: Secondary | ICD-10-CM | POA: Diagnosis present

## 2023-09-30 DIAGNOSIS — R779 Abnormality of plasma protein, unspecified: Secondary | ICD-10-CM | POA: Diagnosis not present

## 2023-09-30 DIAGNOSIS — E871 Hypo-osmolality and hyponatremia: Secondary | ICD-10-CM | POA: Diagnosis present

## 2023-09-30 DIAGNOSIS — Z79899 Other long term (current) drug therapy: Secondary | ICD-10-CM | POA: Diagnosis not present

## 2023-09-30 DIAGNOSIS — D539 Nutritional anemia, unspecified: Secondary | ICD-10-CM | POA: Diagnosis present

## 2023-09-30 DIAGNOSIS — F1090 Alcohol use, unspecified, uncomplicated: Secondary | ICD-10-CM | POA: Diagnosis present

## 2023-09-30 DIAGNOSIS — E860 Dehydration: Secondary | ICD-10-CM | POA: Diagnosis present

## 2023-09-30 DIAGNOSIS — K219 Gastro-esophageal reflux disease without esophagitis: Secondary | ICD-10-CM | POA: Diagnosis present

## 2023-09-30 DIAGNOSIS — E7220 Disorder of urea cycle metabolism, unspecified: Secondary | ICD-10-CM | POA: Diagnosis present

## 2023-09-30 LAB — URINE CULTURE: Culture: NO GROWTH

## 2023-09-30 LAB — BASIC METABOLIC PANEL WITH GFR
Anion gap: 12 (ref 5–15)
BUN: 7 mg/dL (ref 6–20)
CO2: 21 mmol/L — ABNORMAL LOW (ref 22–32)
Calcium: 8 mg/dL — ABNORMAL LOW (ref 8.9–10.3)
Chloride: 107 mmol/L (ref 98–111)
Creatinine, Ser: 0.67 mg/dL (ref 0.44–1.00)
GFR, Estimated: >60 mL/min (ref >60)
Glucose, Bld: 95 mg/dL (ref 70–99)
Potassium: 4.1 mmol/L (ref 3.5–5.1)
Sodium: 140 mmol/L (ref 135–145)

## 2023-09-30 LAB — CBC
HCT: 29.3 % — ABNORMAL LOW (ref 36.0–46.0)
Hemoglobin: 9.8 g/dL — ABNORMAL LOW (ref 12.0–15.0)
MCH: 34.4 pg — ABNORMAL HIGH (ref 26.0–34.0)
MCHC: 33.4 g/dL (ref 30.0–36.0)
MCV: 102.8 fL — ABNORMAL HIGH (ref 80.0–100.0)
Platelets: 157 10*3/uL (ref 150–400)
RBC: 2.85 MIL/uL — ABNORMAL LOW (ref 3.87–5.11)
RDW: 15.7 % — ABNORMAL HIGH (ref 11.5–15.5)
WBC: 7.6 10*3/uL (ref 4.0–10.5)
nRBC: 0 % (ref 0.0–0.2)

## 2023-09-30 MED ORDER — HALOPERIDOL 0.5 MG PO TABS
1.0000 mg | ORAL_TABLET | ORAL | Status: DC | PRN
  Filled 2023-09-30: qty 2

## 2023-09-30 MED ORDER — HALOPERIDOL LACTATE 5 MG/ML IJ SOLN
1.0000 mg | INTRAMUSCULAR | Status: DC | PRN
  Administered 2023-10-02: 1 mg via INTRAMUSCULAR
  Filled 2023-09-30: qty 1

## 2023-09-30 MED ORDER — SODIUM CHLORIDE 0.9 % IV SOLN
1.0000 g | INTRAVENOUS | Status: DC
  Administered 2023-09-30: 1 g via INTRAVENOUS
  Filled 2023-09-30: qty 10

## 2023-09-30 MED ORDER — CEPHALEXIN 500 MG PO CAPS
500.0000 mg | ORAL_CAPSULE | Freq: Two times a day (BID) | ORAL | Status: AC
  Administered 2023-10-01 – 2023-10-03 (×6): 500 mg via ORAL
  Filled 2023-09-30 (×6): qty 1

## 2023-09-30 NOTE — Progress Notes (Addendum)
 Progress Note   Patient: Cynthia Mclean DOB: [birth year] DOA: 09/29/2023     0 DOS: the patient was seen and examined on 09/30/2023   Brief hospital course: Per H&P, HPI   Cynthia Mclean is a 58 y.o. female with medical history significant of history of seizure disorders, prior CVA, hypertension, hyperlipidemia, GERD and hypokalemia; who presented to the hospital after she was found on the floor by her daughter and noticing altered mentation. Patient reports no dysuria but has present frequency; there was some information reported to EDP regarding seeing some blood in her mouth at home; none seen here.   No fever, no chest pain, no shortness of breath, no nausea, no vomiting, no sick contacts or any other complaints has been reported.   CT scan of the head demonstrated no acute intracranial normalities; for the most part stable blood work except for mild hypokalemia (3.2).Aaron Aas  Urinalysis with positive nitrite and positive bacteria.  Patient's ammonia level around 80.    Cultures taken, Rocephin  started and TRH contacted to place patient in the hospital for further evaluation and management.    Assessment and Plan: AMS Improving Appears to be improving.  Possibly related to UTI,  Ucx NGTD Maybe a component of alcohol intake (unclear exactly how much patient drinks but daughter reports a beer every few days) Possible component of dehydration as well, daughter reports patient not eating as much Patient is also on dilantin  this could be supratherapeutic  TSH/vit b12 WNL CT head without evidence of acute intracranial abnormalities\ S/p IV fluids   F/u phenytoin  level  Continue to treat for UTI, will narrow keflex  for 3 additional days  Ensure hydration  Hyperammonemia Possibly in setting of phenytoin  v UTI v dehydration  Ensure appropiate hydration -Recheck ammonia in the AM   Macrocytic anemia     Latest Ref Rng & Units 09/30/2023    7:58 AM 09/29/2023    7:56 AM 09/02/2023     6:12 AM  CBC  WBC 4.0 - 10.5 K/uL 7.6  7.8  14.6   Hemoglobin 12.0 - 15.0 g/dL 9.8  9.4  9.8   Hematocrit 36.0 - 46.0 % 29.3  28.2  27.9   Platelets 150 - 400 K/uL 157  149  174    Stable.  Folate low on recent labs. Also noted to drink alcohol. Continue with PO supplementation.  Vit B12 WNL.  Check iron  panel    No prior h/o liver disease  H/o CVA  Residual LLE/LUE deficits Continue ASA   H/o seizure disorder Continue home dilantin   HTN Continue home antihypertensives  H/o Hypokalemia Continue daily supplementaiton  GERD Noted. Continue home PPI      Subjective: Patient with garbled speech, but this may be baseline. Occasionally with inappropriate responses to questions. Thought answered orientation questions correctly.   Physical Exam: Vitals:   09/29/23 1844 09/29/23 2100 09/30/23 0139 09/30/23 1437  BP: (!) 146/71 134/68 (!) 112/59 (!) 104/53  Pulse: 71 72 67 70  Resp: 18 18 17 16   Temp: 97.7 F (36.5 C) 97.8 F (36.6 C) 97.6 F (36.4 C) 98.1 F (36.7 C)  TempSrc: Oral Oral Oral Oral  SpO2: 100% 99% 100% 100%  Weight:      Height:       Physical Exam  Constitutional: In no distress.  Cardiovascular: Normal rate, regular rhythm. No lower extremity edema  Pulmonary: Non labored breathing on room air, no wheezing or rales.   Abdominal: Soft. Normal bowel  sounds. Non distended and non tender    MSK: L hand contracture Neurological: Alert and oriented to person, place, and time. LUE 3/5, LLE 2/5 strength  Skin: Skin is warm and dry.   Data Reviewed:     Latest Ref Rng & Units 09/30/2023    7:58 AM 09/29/2023    7:56 AM 09/02/2023    6:12 AM  CBC  WBC 4.0 - 10.5 K/uL 7.6  7.8  14.6   Hemoglobin 12.0 - 15.0 g/dL 9.8  9.4  9.8   Hematocrit 36.0 - 46.0 % 29.3  28.2  27.9   Platelets 150 - 400 K/uL 157  149  174       Latest Ref Rng & Units 09/30/2023    7:58 AM 09/29/2023    7:56 AM 09/04/2023    3:57 AM  BMP  Glucose 70 - 99 mg/dL 95  960  93   BUN 6 -  20 mg/dL 7  6  <5   Creatinine 4.54 - 1.00 mg/dL 0.98  1.19  1.47   Sodium 135 - 145 mmol/L 140  135  134   Potassium 3.5 - 5.1 mmol/L 4.1  3.2  2.8   Chloride 98 - 111 mmol/L 107  105  104   CO2 22 - 32 mmol/L 21  23  26    Calcium  8.9 - 10.3 mg/dL 8.0  8.0  7.6      Family Communication: Updated daughter via phone   Disposition: Status is: Inpatient Remains inpatient appropriate because: Remains confused   Planned Discharge Destination: Home    Time spent: 35 minutes  Author: Joette Mustard, MD 09/30/2023 5:07 PM  For on call review [email or web address].

## 2023-09-30 NOTE — Plan of Care (Signed)

## 2023-09-30 NOTE — Hospital Course (Signed)
 Pain in lle. Constant for patinet.   CVA with l sided deficits.   L leg   L hand contracture.   Just before confusion. Maybe had a beer.   Drinks a beer every couple of days, usually a single 12 oz

## 2023-10-01 DIAGNOSIS — R7889 Finding of other specified substances, not normally found in blood: Secondary | ICD-10-CM

## 2023-10-01 DIAGNOSIS — R4182 Altered mental status, unspecified: Secondary | ICD-10-CM | POA: Diagnosis not present

## 2023-10-01 DIAGNOSIS — R569 Unspecified convulsions: Secondary | ICD-10-CM | POA: Diagnosis not present

## 2023-10-01 DIAGNOSIS — R779 Abnormality of plasma protein, unspecified: Secondary | ICD-10-CM

## 2023-10-01 LAB — BASIC METABOLIC PANEL WITH GFR
Anion gap: 3 — ABNORMAL LOW (ref 5–15)
BUN: 8 mg/dL (ref 6–20)
CO2: 25 mmol/L (ref 22–32)
Calcium: 8.5 mg/dL — ABNORMAL LOW (ref 8.9–10.3)
Chloride: 106 mmol/L (ref 98–111)
Creatinine, Ser: 0.77 mg/dL (ref 0.44–1.00)
GFR, Estimated: >60 mL/min (ref >60)
Glucose, Bld: 98 mg/dL (ref 70–99)
Potassium: 4.7 mmol/L (ref 3.5–5.1)
Sodium: 134 mmol/L — ABNORMAL LOW (ref 135–145)

## 2023-10-01 LAB — MAGNESIUM: Magnesium: 1.7 mg/dL (ref 1.7–2.4)

## 2023-10-01 LAB — CBC
HCT: 28.4 % — ABNORMAL LOW (ref 36.0–46.0)
Hemoglobin: 9.6 g/dL — ABNORMAL LOW (ref 12.0–15.0)
MCH: 34.7 pg — ABNORMAL HIGH (ref 26.0–34.0)
MCHC: 33.8 g/dL (ref 30.0–36.0)
MCV: 102.5 fL — ABNORMAL HIGH (ref 80.0–100.0)
Platelets: 179 10*3/uL (ref 150–400)
RBC: 2.77 MIL/uL — ABNORMAL LOW (ref 3.87–5.11)
RDW: 15.8 % — ABNORMAL HIGH (ref 11.5–15.5)
WBC: 8.8 10*3/uL (ref 4.0–10.5)
nRBC: 0 % (ref 0.0–0.2)

## 2023-10-01 LAB — PHOSPHORUS: Phosphorus: 2 mg/dL — ABNORMAL LOW (ref 2.5–4.6)

## 2023-10-01 LAB — IRON AND TIBC
Iron: 55 ug/dL (ref 28–170)
Saturation Ratios: 55 % — ABNORMAL HIGH (ref 10.4–31.8)
TIBC: 100 ug/dL — ABNORMAL LOW (ref 250–450)
UIBC: 45 ug/dL

## 2023-10-01 LAB — PHENYTOIN LEVEL, TOTAL
Phenytoin Lvl: 37.2 ug/mL (ref 10.0–20.0)
Phenytoin Lvl: 34.4 ug/mL (ref 10.0–20.0)

## 2023-10-01 LAB — FERRITIN: Ferritin: 135 ng/mL (ref 11–307)

## 2023-10-01 LAB — AMMONIA: Ammonia: 53 umol/L — ABNORMAL HIGH (ref 9–35)

## 2023-10-01 MED ORDER — MAGNESIUM SULFATE 4 GM/100ML IV SOLN
4.0000 g | Freq: Once | INTRAVENOUS | Status: AC
  Administered 2023-10-01: 4 g via INTRAVENOUS
  Filled 2023-10-01: qty 100

## 2023-10-01 MED ORDER — SODIUM PHOSPHATES 45 MMOLE/15ML IV SOLN
30.0000 mmol | Freq: Once | INTRAVENOUS | Status: AC
  Administered 2023-10-01: 30 mmol via INTRAVENOUS
  Filled 2023-10-01: qty 10

## 2023-10-01 MED ORDER — CHLORHEXIDINE GLUCONATE CLOTH 2 % EX PADS
6.0000 | MEDICATED_PAD | Freq: Every day | CUTANEOUS | Status: DC
  Administered 2023-10-01 – 2023-10-04 (×4): 6 via TOPICAL

## 2023-10-01 MED ORDER — LACTATED RINGERS IV SOLN: INTRAVENOUS | Status: AC

## 2023-10-01 MED ORDER — THIAMINE HCL 100 MG/ML IJ SOLN
100.0000 mg | INTRAMUSCULAR | Status: DC
  Administered 2023-10-01 – 2023-10-03 (×3): 100 mg via INTRAVENOUS
  Filled 2023-10-01 (×3): qty 2

## 2023-10-01 NOTE — Progress Notes (Signed)
 Progress Note   Patient: Cynthia Mclean JXB:[id number] DOB: [birth year] DOA: 09/29/2023     1 DOS: the patient was seen and examined on 10/01/2023   Brief hospital course: Per H&P, HPI   Cynthia Mclean is a 58 y.o. female with medical history significant of history of seizure disorders, prior CVA, hypertension, hyperlipidemia, GERD and hypokalemia; who presented to the hospital after she was found on the floor by her daughter and noticing altered mentation. Patient reports no dysuria but has present frequency; there was some information reported to EDP regarding seeing some blood in her mouth at home; none seen here.   No fever, no chest pain, no shortness of breath, no nausea, no vomiting, no sick contacts or any other complaints has been reported.   CT scan of the head demonstrated no acute intracranial normalities; for the most part stable blood work except for mild hypokalemia (3.2).Cynthia Mclean  Urinalysis with positive nitrite and positive bacteria.  Patient's ammonia level around 80.    Cultures taken, Rocephin  started and TRH contacted to place patient in the hospital for further evaluation and management.    Assessment and Plan: AMS Improved  Possibly related to UTI,  Ucx NGTD however. Patient's phenytoin  level also supratherapeutic  Maybe a component of alcohol intake (unclear exactly how much patient drinks but daughter reports a beer every few days) Possible component of dehydration as well, daughter reports patient not eating as much TSH/vit b12 WNL CT head without evidence of acute intracranial abnormalities\ S/p IV fluids   Plan: IV fluids Discussed supra therapeutic levels with neuro and poison control, will continue to trend levels and start IV fluids, check EKG. Unclear why levels are supra therapeutic, patient's daughter states that she ensures her mother takes all of her medications as prescribed.  Continue to treat for UTI, will narrow keflex  for 3 additional days  Ensure  hydration  Hyperammonemia Possibly in setting of phenytoin  v UTI v dehydration  Levels improving this AM  -Ensure appropiate hydration -Recheck ammonia in the AM   Macrocytic anemia     Latest Ref Rng & Units 10/01/2023    3:48 AM 09/30/2023    7:58 AM 09/29/2023    7:56 AM  CBC  WBC 4.0 - 10.5 K/uL 8.8  7.6  7.8   Hemoglobin 12.0 - 15.0 g/dL 9.6  9.8  9.4   Hematocrit 36.0 - 46.0 % 28.4  29.3  28.2   Platelets 150 - 400 K/uL 179  157  149   No iron  deficiency.  Folate low on recent labs. Also noted to drink alcohol. Continue with PO supplementation.  Vit B12 WNL.    No prior h/o liver disease  H/o CVA  Residual LLE/LUE deficits Continue ASA   H/o seizure disorder Dilantin  is supr  HTN Continue home antihypertensives  H/o Hypokalemia Continue daily supplementaiton  GERD Noted. Continue home PPI      Subjective: Patient's daughter able to see patient this afternoon. She feels patient is having some confusion, she said the patient told her  "police officers in the hallway who were waiting to take her to jail". Patient was drowsy when  on this exam. She was awaking from a nap. She continues to have discomfort in her LLE and LUE which are chronic issues but no other complaints.    Physical Exam: Vitals:   09/30/23 1437 09/30/23 1900 10/01/23 0350 10/01/23 1403  BP: (!) 104/53 (!) 112/52 (!) 117/51 (!) 96/47  Pulse: 70 69 69 70  Resp: 16 17 18 16   Temp: 98.1 F (36.7 C) 98.5 F (36.9 C) 97.7 F (36.5 C) 98.6 F (37 C)  TempSrc: Oral Oral Oral   SpO2: 100% 100% 100% 100%  Weight:      Height:       Physical Exam  Constitutional: In no distress. Drowsy  Cardiovascular: Normal rate, regular rhythm. No lower extremity edema  Pulmonary: Non labored breathing on room air, no wheezing or rales.   Abdominal: Soft. Normal bowel sounds. Non distended and non tender Musculoskeletal: L hand contracture  Neurological: Alert and oriented to person, place, but not time. LUE  3/5, LLE 2/5 strength  Skin: Skin is warm and dry.   Data Reviewed:     Latest Ref Rng & Units 10/01/2023    3:48 AM 09/30/2023    7:58 AM 09/29/2023    7:56 AM  CBC  WBC 4.0 - 10.5 K/uL 8.8  7.6  7.8   Hemoglobin 12.0 - 15.0 g/dL 9.6  9.8  9.4   Hematocrit 36.0 - 46.0 % 28.4  29.3  28.2   Platelets 150 - 400 K/uL 179  157  149       Latest Ref Rng & Units 10/01/2023    3:48 AM 09/30/2023    7:58 AM 09/29/2023    7:56 AM  BMP  Glucose 70 - 99 mg/dL 98  95  161   BUN 6 - 20 mg/dL 8  7  6    Creatinine 0.44 - 1.00 mg/dL 0.96  0.45  4.09   Sodium 135 - 145 mmol/L 134  140  135   Potassium 3.5 - 5.1 mmol/L 4.7  4.1  3.2   Chloride 98 - 111 mmol/L 106  107  105   CO2 22 - 32 mmol/L 25  21  23    Calcium  8.9 - 10.3 mg/dL 8.5  8.0  8.0      Family Communication: Updated daughter via phone   Disposition: Status is: Inpatient Remains inpatient appropriate because: Remains confused   Planned Discharge Destination: Home    Time spent: 35 minutes  Author: Joette Mustard, MD 10/01/2023 4:49 PM  For on call review [email or web address].

## 2023-10-01 NOTE — Progress Notes (Signed)
 I am asked to comment on management of phenytoin  for this patient close level has resulted elevated (in the setting of low albumin likely even more elevated than detected here), who presented with altered mental status that has been improving.  On review of the chart it appears that she also has a history complicated by alcohol use disorder and frequent medication nonadherence.  In fact the last time that she had altered mental status with elevated phenytoin  level was in April 2022  I also saw her in November 2021 at which time her blood alcohol level was greater than 300 and her phenytoin  level was undetectable  Suspect intermittent nonadherence and potential interactions with alcohol is making her levels challenging to stabilize  Additionally phenytoin  levels do interact with alcohol use.  Acute intoxication can increase phenytoin  levels whereas chronic use can induce liver enzymes that increase clearance and drop phenytoin  levels  Recommend change to Keppra 500 mg twice daily, in search of the chart she does not appear to have tried this medication before, and it should be a safer option   Ultimately she needs outpatient neurology follow-up to clarify seizure history and need for ongoing antiseizure medications     Latest Reference Range & Units 08/05/20 14:21 09/28/21 15:41 05/09/23 13:57 09/01/23 02:19 10/01/23 03:48 10/01/23 13:39  Phenytoin  Lvl 10.0 - 20.0 ug/mL 34.6 (HH)   <2.5 (L) 37.2 (HH) 34.4 (HH)  Phenytoin , Serum 10.0 - 20.0 ug/mL  7.1 (L) 4.0 (L)     Phenytoin , Free 1.0 - 2.0 ug/mL  None Detected None Detected     (HH): Data is critically high (L): Data is abnormally low  Component Ref Range & Units (hover) 3 yr ago (03/11/20) 9 yr ago (03/04/14) 10 yr ago (09/04/13) 10 yr ago (08/21/13) 10 yr ago (08/17/13) 10 yr ago (08/14/13) 10 yr ago (01/19/13)  Phenytoin  Lvl <2.5 Low  7.5 Low  14.7 11.9 31.7 High Panic  41.7 High  CM 13.0    16 min spent in care, majority in  discussion with Dr.  Hammans, who will notify patient/family of interprofessional consultation

## 2023-10-01 NOTE — Progress Notes (Signed)
   10/01/23 0520  Provider Notification  Provider Name/Title Dr. Sunnie England, MD  Date Provider Notified 10/01/23  Time Provider Notified [phone]  Method of Notification Page  Notification Reason Critical Result  Test performed and critical result dilantin  37.2  Date Critical Result Received 10/01/23  Time Critical Result Received 0517  Provider response No new orders  Date of Provider Response 10/01/23  Time of Provider Response [phone]

## 2023-10-02 DIAGNOSIS — R4182 Altered mental status, unspecified: Secondary | ICD-10-CM | POA: Diagnosis not present

## 2023-10-02 DIAGNOSIS — R7889 Finding of other specified substances, not normally found in blood: Secondary | ICD-10-CM | POA: Diagnosis not present

## 2023-10-02 LAB — BASIC METABOLIC PANEL WITH GFR
Anion gap: 6 (ref 5–15)
Anion gap: 5 (ref 5–15)
BUN: 7 mg/dL (ref 6–20)
BUN: 7 mg/dL (ref 6–20)
CO2: 22 mmol/L (ref 22–32)
CO2: 21 mmol/L — ABNORMAL LOW (ref 22–32)
Calcium: 8.2 mg/dL — ABNORMAL LOW (ref 8.9–10.3)
Calcium: 7.9 mg/dL — ABNORMAL LOW (ref 8.9–10.3)
Chloride: 107 mmol/L (ref 98–111)
Chloride: 106 mmol/L (ref 98–111)
Creatinine, Ser: 0.81 mg/dL (ref 0.44–1.00)
Creatinine, Ser: 0.82 mg/dL (ref 0.44–1.00)
GFR, Estimated: >60 mL/min (ref >60)
GFR, Estimated: >60 mL/min (ref >60)
Glucose, Bld: 78 mg/dL (ref 70–99)
Glucose, Bld: 111 mg/dL — ABNORMAL HIGH (ref 70–99)
Potassium: 5.3 mmol/L — ABNORMAL HIGH (ref 3.5–5.1)
Potassium: 4.9 mmol/L (ref 3.5–5.1)
Sodium: 135 mmol/L (ref 135–145)
Sodium: 132 mmol/L — ABNORMAL LOW (ref 135–145)

## 2023-10-02 LAB — CBC
HCT: 28.2 % — ABNORMAL LOW (ref 36.0–46.0)
Hemoglobin: 9.5 g/dL — ABNORMAL LOW (ref 12.0–15.0)
MCH: 34.4 pg — ABNORMAL HIGH (ref 26.0–34.0)
MCHC: 33.7 g/dL (ref 30.0–36.0)
MCV: 102.2 fL — ABNORMAL HIGH (ref 80.0–100.0)
Platelets: 189 10*3/uL (ref 150–400)
RBC: 2.76 MIL/uL — ABNORMAL LOW (ref 3.87–5.11)
RDW: 15.8 % — ABNORMAL HIGH (ref 11.5–15.5)
WBC: 10.3 10*3/uL (ref 4.0–10.5)
nRBC: 0 % (ref 0.0–0.2)

## 2023-10-02 LAB — AMMONIA: Ammonia: 40 umol/L — ABNORMAL HIGH (ref 9–35)

## 2023-10-02 LAB — PHENYTOIN LEVEL, TOTAL
Phenytoin Lvl: 34.1 ug/mL (ref 10.0–20.0)
Phenytoin Lvl: 32.4 ug/mL (ref 10.0–20.0)

## 2023-10-02 LAB — PHOSPHORUS: Phosphorus: 3.1 mg/dL (ref 2.5–4.6)

## 2023-10-02 NOTE — Plan of Care (Signed)

## 2023-10-02 NOTE — Progress Notes (Signed)
 Date and time results received: 10/02/2023 0510  Test: Other  Critical Value: Phenytoin  34.1  Name of Provider Notified: Arrien  Orders Received? Or Actions Taken?: No new orders at this time.

## 2023-10-02 NOTE — Progress Notes (Signed)
 Spoke with Danielle from [employer], updated her on the status of the patient. MD Arrien made aware of their concerns.

## 2023-10-02 NOTE — Progress Notes (Signed)
 Progress Note   Patient: Cynthia Mclean:[id number] DOB: 05-21-1965 DOA: 09/29/2023     2 DOS: the patient was seen and examined on 10/02/2023   Brief hospital course: Per H&P, HPI   Cynthia Mclean is a 58 y.o. female with medical history significant of history of seizure disorders, prior CVA, hypertension, hyperlipidemia, GERD and hypokalemia; who presented to the hospital after she was found on the floor by her daughter and noticing altered mentation. Patient reports no dysuria but has present frequency; there was some information reported to EDP regarding seeing some blood in her mouth at home; none seen here.   No fever, no chest pain, no shortness of breath, no nausea, no vomiting, no sick contacts or any other complaints has been reported.   CT scan of the head demonstrated no acute intracranial normalities; for the most part stable blood work except for mild hypokalemia (3.2).Aaron Aas  Urinalysis with positive nitrite and positive bacteria.  Patient's ammonia level around 80.    Cultures taken, Rocephin  started and TRH contacted to place patient in the hospital for further evaluation and management.    Assessment and Plan: AMS Improved  Possibly related to UTI,  Ucx NGTD however. Patient's phenytoin  level also supratherapeutic  Maybe a component of alcohol intake (unclear exactly how much patient drinks but daughter reports a beer every few days) Possible component of dehydration as well, daughter reports patient not eating as much at home.  TSH/vit b12 WNL CT head without evidence of acute intracranial abnormalities S/p IV fluids  Phenytoin  level decreased this PM   Plan: IV fluids Encourage PO intake  Continue to ensure dilantin  levels decreasing    Hyperammonemia Possibly in setting of phenytoin  v UTI v dehydration  Levels improved  -Ensure appropiate hydration  Hyponatremia      Latest Ref Rng & Units 10/02/2023    9:50 AM 10/02/2023    2:43 AM 10/01/2023    3:48 AM  BMP   Glucose 70 - 99 mg/dL 956  78  98   BUN 6 - 20 mg/dL 7  7  8    Creatinine 0.44 - 1.00 mg/dL 2.13  0.86  5.78   Sodium 135 - 145 mmol/L 132  135  134   Potassium 3.5 - 5.1 mmol/L 4.9  5.3  4.7   Chloride 98 - 111 mmol/L 106  107  106   CO2 22 - 32 mmol/L 21  22  25    Calcium  8.9 - 10.3 mg/dL 7.9  8.2  8.5    Mild.   Continue to monitor    Macrocytic anemia     Latest Ref Rng & Units 10/02/2023    2:43 AM 10/01/2023    3:48 AM 09/30/2023    7:58 AM  CBC  WBC 4.0 - 10.5 K/uL 10.3  8.8  7.6   Hemoglobin 12.0 - 15.0 g/dL 9.5  9.6  9.8   Hematocrit 36.0 - 46.0 % 28.2  28.4  29.3   Platelets 150 - 400 K/uL 189  179  157   Stable. No iron  deficiency.  Folate low on recent labs. Also noted to drink alcohol. Continue with PO supplementation.  Vit B12 WNL.  Continue to monitor   No prior h/o liver disease  H/o CVA  Residual LLE/LUE deficits Continue ASA   H/o seizure disorder Dilantin  is supratherapeutic and on hold.  Will need to transition to keppra prior to discharge   HTN Continue home antihypertensives  H/o Hypokalemia Hold  GERD Noted. Continue home PPI      Subjective: No acute complaints this AM.   Physical Exam: Vitals:   10/01/23 1403 10/01/23 2007 10/01/23 2324 10/02/23 0342  BP: (!) 96/47 (!) 93/47 (!) 112/58 (!) 107/54  Pulse: 70 73 74 82  Resp: 16 17    Temp: 98.6 F (37 C) 97.7 F (36.5 C) 98.2 F (36.8 C) 98 F (36.7 C)  TempSrc:  Oral Oral Oral  SpO2: 100% 100% 99% 96%  Weight:      Height:         Physical Exam  Constitutional: In no distress.  Cardiovascular: Normal rate, regular rhythm. No lower extremity edema  Pulmonary: Non labored breathing on room air, no wheezing or rales.   Abdominal: Soft. Normal bowel sounds. Non distended and non tender MSK: L hand contracture      Neurological: Alert and oriented to person, place, and time. Strength unchanged from day prior  Skin: Skin is warm and dry.    Data Reviewed:     Latest  Ref Rng & Units 10/02/2023    2:43 AM 10/01/2023    3:48 AM 09/30/2023    7:58 AM  CBC  WBC 4.0 - 10.5 K/uL 10.3  8.8  7.6   Hemoglobin 12.0 - 15.0 g/dL 9.5  9.6  9.8   Hematocrit 36.0 - 46.0 % 28.2  28.4  29.3   Platelets 150 - 400 K/uL 189  179  157       Latest Ref Rng & Units 10/02/2023    9:50 AM 10/02/2023    2:43 AM 10/01/2023    3:48 AM  BMP  Glucose 70 - 99 mg/dL 161  78  98   BUN 6 - 20 mg/dL 7  7  8    Creatinine 0.44 - 1.00 mg/dL 0.96  0.45  4.09   Sodium 135 - 145 mmol/L 132  135  134   Potassium 3.5 - 5.1 mmol/L 4.9  5.3  4.7   Chloride 98 - 111 mmol/L 106  107  106   CO2 22 - 32 mmol/L 21  22  25    Calcium  8.9 - 10.3 mg/dL 7.9  8.2  8.5      Family Communication: Discussed with patient   Disposition: Status is: Inpatient Remains inpatient appropriate because: Remains confused   Planned Discharge Destination: Home    Time spent: 35 minutes  Author: Joette Mustard, MD 10/02/2023 1:10 PM  For on call review [email or web address].

## 2023-10-02 NOTE — Progress Notes (Signed)
 Patient alert to self and place. Patient slept on and off throughout the shift. Patient has been seeing things. Patient stated, "there was a man in the hallway carrying a rifle."

## 2023-10-03 DIAGNOSIS — R4182 Altered mental status, unspecified: Secondary | ICD-10-CM | POA: Diagnosis not present

## 2023-10-03 LAB — CBC
HCT: 29.3 % — ABNORMAL LOW (ref 36.0–46.0)
Hemoglobin: 9.8 g/dL — ABNORMAL LOW (ref 12.0–15.0)
MCH: 33.9 pg (ref 26.0–34.0)
MCHC: 33.4 g/dL (ref 30.0–36.0)
MCV: 101.4 fL — ABNORMAL HIGH (ref 80.0–100.0)
Platelets: 188 10*3/uL (ref 150–400)
RBC: 2.89 MIL/uL — ABNORMAL LOW (ref 3.87–5.11)
RDW: 15.6 % — ABNORMAL HIGH (ref 11.5–15.5)
WBC: 9.7 10*3/uL (ref 4.0–10.5)
nRBC: 0 % (ref 0.0–0.2)

## 2023-10-03 LAB — BASIC METABOLIC PANEL WITH GFR
Anion gap: 6 (ref 5–15)
BUN: 7 mg/dL (ref 6–20)
CO2: 25 mmol/L (ref 22–32)
Calcium: 8.2 mg/dL — ABNORMAL LOW (ref 8.9–10.3)
Chloride: 105 mmol/L (ref 98–111)
Creatinine, Ser: 0.71 mg/dL (ref 0.44–1.00)
GFR, Estimated: >60 mL/min (ref >60)
Glucose, Bld: 91 mg/dL (ref 70–99)
Potassium: 4.5 mmol/L (ref 3.5–5.1)
Sodium: 136 mmol/L (ref 135–145)

## 2023-10-03 LAB — PHENYTOIN LEVEL, TOTAL: Phenytoin Lvl: 28.6 ug/mL — ABNORMAL HIGH (ref 10.0–20.0)

## 2023-10-03 MED ORDER — LEVETIRACETAM 500 MG PO TABS
500.0000 mg | ORAL_TABLET | Freq: Two times a day (BID) | ORAL | Status: DC
  Administered 2023-10-03 – 2023-10-04 (×3): 500 mg via ORAL
  Filled 2023-10-03 (×3): qty 1

## 2023-10-03 NOTE — Progress Notes (Signed)
 PROGRESS NOTE    Cynthia Mclean  ZOX:[id number] DOB: 15-Mar-1966 DOA: 09/29/2023 PCP: Meldon Sport, MD   Brief Narrative:    Cynthia Mclean is a 58 y.o. female with medical history significant of history of seizure disorders, prior CVA, hypertension, hyperlipidemia, GERD and hypokalemia; who presented to the hospital after she was found on the floor by her daughter and noticing altered mentation. Patient reports no dysuria but has present frequency; there was some information reported to EDP regarding seeing some blood in her mouth at home; none seen here.  Patient was admitted with acute metabolic encephalopathy likely multifactorial in the setting of UTI as well as supratherapeutic phenytoin  level which has now been improving.  She was also noted to have some dehydration.  PT/OT evaluation ordered and currently pending.  Assessment & Plan:   Principal Problem:   AMS (altered mental status) Active Problems:   Essential hypertension   Seizures (HCC)   Mixed hyperlipidemia   History of cerebrovascular accident (CVA) in adulthood   Hyperammonemia (HCC)   UTI (urinary tract infection)  Assessment and Plan:  Acute metabolic encephalopathy-multifactorial Possibly related to UTI,  Ucx NGTD however. Patient's phenytoin  level also supratherapeutic  Maybe a component of alcohol intake (unclear exactly how much patient drinks but daughter reports a beer every few days) Possible component of dehydration as well, daughter reports patient not eating as much at home.  TSH/vit b12 WNL CT head without evidence of acute intracranial abnormalities S/p IV fluids  Phenytoin  level decreased this PM    Plan: IV fluids as needed Encourage PO intake  Continue to ensure dilantin  levels decreasing  PT/OT evaluation given fall     Hyperammonemia-resolved Possibly in setting of phenytoin  v UTI v dehydration  Levels improved  -Ensure appropiate hydration    Macrocytic anemia  Stable. No iron   deficiency.  Folate low on recent labs. Also noted to drink alcohol. Continue with PO supplementation.  Vit B12 WNL.  Continue to monitor    No prior h/o liver disease  H/o CVA  Residual LLE/LUE deficits Continue ASA    H/o seizure disorder Dilantin  is supratherapeutic and on hold.  Started on Keppra 6/9   HTN Continue home antihypertensives \ GERD Noted. Continue home PPI    DVT prophylaxis: SCDs Code Status: Full Family Communication: None at bedside Disposition Plan:  Status is: Inpatient Remains inpatient appropriate because: Need for continued IV medications.  Skin Assessment:  I have examined the patient's skin and I agree with the wound assessment as performed by the wound care RN as outlined below:  Pressure Injury 09/29/23 Sacrum Medial Stage 2 -  Partial thickness loss of dermis presenting as a shallow open injury with a red, pink wound bed without slough. stage 2 healing area to sacrum 1.5x1 (Active)  09/29/23 1330  Location: Sacrum  Location Orientation: Medial  Staging: Stage 2 -  Partial thickness loss of dermis presenting as a shallow open injury with a red, pink wound bed without slough.  Wound Description (Comments): stage 2 healing area to sacrum 1.5x1  Present on Admission: Yes  Dressing Type Foam - Lift dressing to assess site every shift 10/03/23 0745    Consultants:  None  Procedures:  None  Antimicrobials:  Anti-infectives (From admission, onward)    Start     Dose/Rate Route Frequency Ordered Stop   10/01/23 1000  cephALEXin  (KEFLEX ) capsule 500 mg        500 mg Oral Every 12 hours 09/30/23 1720  10/04/23 0959   09/30/23 1500  cefTRIAXone  (ROCEPHIN ) 1 g in sodium chloride  0.9 % 100 mL IVPB  Status:  Discontinued        1 g 200 mL/hr over 30 Minutes Intravenous Every 24 hours 09/30/23 1420 09/30/23 1720   09/29/23 1200  cefTRIAXone  (ROCEPHIN ) 1 g in sodium chloride  0.9 % 100 mL IVPB        1 g 200 mL/hr over 30 Minutes Intravenous  Once  09/29/23 1146 09/29/23 1248       Subjective: Patient seen and evaluated today with no new acute complaints or concerns. No acute concerns or events noted overnight.  She appears less confused, but is overall quite weak and requires further PT/OT evaluation.  Objective: Vitals:   10/02/23 1500 10/02/23 1932 10/02/23 2144 10/03/23 0344  BP: (!) 110/55 (!) 117/50  132/67  Pulse: 70 68  75  Resp: 15 18 15 14   Temp: 98.5 F (36.9 C) 98.3 F (36.8 C)  97.8 F (36.6 C)  TempSrc: Oral   Oral  SpO2: 97% 100%  100%  Weight:      Height:        Intake/Output Summary (Last 24 hours) at 10/03/2023 1247 Last data filed at 10/03/2023 0600 Gross per 24 hour  Intake 1933.55 ml  Output 2300 ml  Net -366.45 ml   Filed Weights   09/29/23 1330  Weight: 54.9 kg    Examination:  General exam: Appears calm and comfortable  Respiratory system: Clear to auscultation. Respiratory effort normal. Cardiovascular system: S1 & S2 heard, RRR.  Gastrointestinal system: Abdomen is soft Central nervous system: Alert and awake Extremities: No edema Skin: No significant lesions noted Psychiatry: Flat affect.    Data Reviewed: I have personally reviewed following labs and imaging studies  CBC: Recent Labs  Lab 09/29/23 0756 09/30/23 0758 10/01/23 0348 10/02/23 0243 10/03/23 0525  WBC 7.8 7.6 8.8 10.3 9.7  HGB 9.4* 9.8* 9.6* 9.5* 9.8*  HCT 28.2* 29.3* 28.4* 28.2* 29.3*  MCV 100.0 102.8* 102.5* 102.2* 101.4*  PLT 149* 157 179 189 188   Basic Metabolic Panel: Recent Labs  Lab 09/30/23 0758 10/01/23 0348 10/02/23 0243 10/02/23 0950 10/03/23 0525  NA 140 134* 135 132* 136  K 4.1 4.7 5.3* 4.9 4.5  CL 107 106 107 106 105  CO2 21* 25 22 21* 25  GLUCOSE 95 98 78 111* 91  BUN 7 8 7 7 7   CREATININE 0.67 0.77 0.81 0.82 0.71  CALCIUM  8.0* 8.5* 8.2* 7.9* 8.2*  MG  --  1.7  --   --   --   PHOS  --  2.0* 3.1  --   --    GFR: Estimated Creatinine Clearance: 67.2 mL/min (by C-G formula based  on SCr of 0.71 mg/dL). Liver Function Tests: Recent Labs  Lab 09/29/23 0756  AST 40  ALT 17  ALKPHOS 46  BILITOT 1.2  PROT 7.0  ALBUMIN 2.3*   No results for input(s): "LIPASE", "AMYLASE" in the last 168 hours. Recent Labs  Lab 09/29/23 0959 10/01/23 0348 10/02/23 0243  AMMONIA 80* 53* 40*   Coagulation Profile: No results for input(s): "INR", "PROTIME" in the last 168 hours. Cardiac Enzymes: Recent Labs  Lab 09/29/23 0756  CKTOTAL 53   BNP (last 3 results) No results for input(s): "PROBNP" in the last 8760 hours. HbA1C: No results for input(s): "HGBA1C" in the last 72 hours. CBG: Recent Labs  Lab 09/29/23 0750  GLUCAP 106*   Lipid Profile:  No results for input(s): "CHOL", "HDL", "LDLCALC", "TRIG", "CHOLHDL", "LDLDIRECT" in the last 72 hours. Thyroid Function Tests: No results for input(s): "TSH", "T4TOTAL", "FREET4", "T3FREE", "THYROIDAB" in the last 72 hours. Anemia Panel: Recent Labs    10/01/23 0348  FERRITIN 135  TIBC 100*  IRON  55   Sepsis Labs: No results for input(s): "PROCALCITON", "LATICACIDVEN" in the last 168 hours.  Recent Results (from the past 240 hours)  Urine Culture     Status: None   Collection Time: 09/29/23  8:32 AM   Specimen: Urine, Clean Catch  Result Value Ref Range Status   Specimen Description   Final    URINE, CLEAN CATCH Performed at [facility], [address], [place], [place] [address]    Special Requests   Final    NONE Performed at [facility], [address] [place], [place] [address]    Culture   Final    NO GROWTH Performed at [facility], [address], [place], [place] [address]    Report Status 09/30/2023 FINAL  Final      Radiology Studies: No results found.    Scheduled Meds:  aspirin  EC  81 mg Oral Daily   atorvastatin   20 mg Oral Daily   cephALEXin   500 mg Oral Q12H   Chlorhexidine  Gluconate Cloth  6 each Topical Daily   folic acid   1 mg Oral Daily   lisinopril   20 mg Oral Daily    magnesium  oxide  400 mg Oral Daily   pantoprazole   40 mg Oral Daily   thiamine  (VITAMIN B1) injection  100 mg Intravenous Q24H     LOS: 3 days    Time spent: 55 minutes    Arryana Tolleson Loran Rock, DO [facility]  If 7PM-7AM, please contact night-coverage [email or web address] 10/03/2023, 12:47 PM

## 2023-10-03 NOTE — Progress Notes (Signed)
 Mobility Specialist Progress Note:    10/03/23 1107  Mobility  Activity Stood at bedside;Ambulated with assistance in room  Level of Assistance Maximum assist, patient does 25-49%  Assistive Device None  Distance Ambulated (ft) 1 ft  Range of Motion/Exercises Active;All extremities  Activity Response Tolerated well  Mobility visit 1 Mobility  Mobility Specialist Start Time (ACUTE ONLY) 1040  Mobility Specialist Stop Time (ACUTE ONLY) 1107  Mobility Specialist Time Calculation (min) (ACUTE ONLY) 27 min   Pt received in bed, RN requested assistance. Pt required MaxA to stand and keep balance with no AD. Tolerated well, pt has LOB while attempting to ambulate. Returned supine, all needs met.   Glinda Lapping Mobility Specialist Please contact via Special educational needs teacher or  Rehab office at [phone]

## 2023-10-03 NOTE — Plan of Care (Signed)
  Problem: Clinical Measurements: Goal: Will remain free from infection Outcome: Progressing Goal: Respiratory complications will improve Outcome: Progressing Goal: Cardiovascular complication will be avoided Outcome: Progressing   Problem: Education: Goal: Knowledge of General Education information will improve Description: Including pain rating scale, medication(s)/side effects and non-pharmacologic comfort measures Outcome: Not Progressing   Problem: Activity: Goal: Risk for activity intolerance will decrease Outcome: Not Progressing

## 2023-10-04 DIAGNOSIS — R4182 Altered mental status, unspecified: Secondary | ICD-10-CM | POA: Diagnosis not present

## 2023-10-04 LAB — BASIC METABOLIC PANEL WITH GFR
Anion gap: 5 (ref 5–15)
BUN: 7 mg/dL (ref 6–20)
CO2: 23 mmol/L (ref 22–32)
Calcium: 8 mg/dL — ABNORMAL LOW (ref 8.9–10.3)
Chloride: 108 mmol/L (ref 98–111)
Creatinine, Ser: 0.72 mg/dL (ref 0.44–1.00)
GFR, Estimated: >60 mL/min (ref >60)
Glucose, Bld: 86 mg/dL (ref 70–99)
Potassium: 4.2 mmol/L (ref 3.5–5.1)
Sodium: 136 mmol/L (ref 135–145)

## 2023-10-04 LAB — CBC
HCT: 27.3 % — ABNORMAL LOW (ref 36.0–46.0)
Hemoglobin: 9.2 g/dL — ABNORMAL LOW (ref 12.0–15.0)
MCH: 34.3 pg — ABNORMAL HIGH (ref 26.0–34.0)
MCHC: 33.7 g/dL (ref 30.0–36.0)
MCV: 101.9 fL — ABNORMAL HIGH (ref 80.0–100.0)
Platelets: 174 10*3/uL (ref 150–400)
RBC: 2.68 MIL/uL — ABNORMAL LOW (ref 3.87–5.11)
RDW: 15.6 % — ABNORMAL HIGH (ref 11.5–15.5)
WBC: 9.1 10*3/uL (ref 4.0–10.5)
nRBC: 0 % (ref 0.0–0.2)

## 2023-10-04 LAB — MAGNESIUM: Magnesium: 1.7 mg/dL (ref 1.7–2.4)

## 2023-10-04 LAB — PHENYTOIN LEVEL, TOTAL: Phenytoin Lvl: 27.7 ug/mL — ABNORMAL HIGH (ref 10.0–20.0)

## 2023-10-04 MED ORDER — LEVETIRACETAM 500 MG PO TABS: 500.0000 mg | ORAL_TABLET | Freq: Two times a day (BID) | ORAL | 3 refills | Status: DC

## 2023-10-04 MED ORDER — THIAMINE MONONITRATE 100 MG PO TABS
100.0000 mg | ORAL_TABLET | Freq: Every day | ORAL | Status: DC
  Administered 2023-10-04: 100 mg via ORAL
  Filled 2023-10-04: qty 1

## 2023-10-04 NOTE — Discharge Summary (Signed)
 Physician Discharge Summary  Cynthia Mclean ZOX:[id number] DOB: February 26, 1966 DOA: 09/29/2023  PCP: Meldon Sport, MD  Admit date: 09/29/2023  Discharge date: 10/04/2023  Admitted From:Home  Disposition:  Home  Recommendations for Outpatient Follow-up:  Follow up with PCP in 1-2 weeks Follow-up with neurology with referral sent for further seizure management/medication management Remain on Keppra 500 mg twice daily as prescribed and discontinue further use of phenytoin  Continue other home medications as prior  Home Health: Yes with PT/OT  Equipment/Devices: None  Discharge Condition:Stable  CODE STATUS: Full  Diet recommendation: Heart Healthy  Brief/Interim Summary: Cynthia Mclean is a 58 y.o. female with medical history significant of history of seizure disorders, prior CVA, hypertension, hyperlipidemia, GERD and hypokalemia; who presented to the hospital after she was found on the floor by her daughter and noticing altered mentation. Patient reports no dysuria but has present frequency; there was some information reported to EDP regarding seeing some blood in her mouth at home; none seen here.   Patient was admitted with acute metabolic encephalopathy likely multifactorial in the setting of UTI as well as supratherapeutic phenytoin  level which has now been improving.  This was discussed with neurology with recommendation to initiate Keppra dosing and discontinue further use of phenytoin .  She was also noted to have some dehydration which has improved.  PT/OT evaluation ordered with recommendations to discharge home with home health services.  Her mentation is back to baseline and she is now in stable condition for discharge.  No other acute events or concerns noted during the course of this admission.  Discharge Diagnoses:  Principal Problem:   AMS (altered mental status) Active Problems:   Essential hypertension   Seizures (HCC)   Mixed hyperlipidemia   History of cerebrovascular  accident (CVA) in adulthood   Hyperammonemia (HCC)   UTI (urinary tract infection)  Principal discharge diagnosis: Acute metabolic encephalopathy-related to supratherapeutic phenytoin  levels as well as alcohol use.  Discharge Instructions  Discharge Instructions     Ambulatory referral to Neurology   Complete by: As directed    An appointment is requested in approximately: 8 weeks   Diet - low sodium heart healthy   Complete by: As directed    Increase activity slowly   Complete by: As directed    No wound care   Complete by: As directed       Allergies as of 10/04/2023   No Known Allergies      Medication List     STOP taking these medications    phenytoin  100 MG ER capsule Commonly known as: DILANTIN        TAKE these medications    aspirin  EC 81 MG tablet Take 1 tablet (81 mg total) by mouth daily. Swallow whole.   atorvastatin  20 MG tablet Commonly known as: LIPITOR Take 1 tablet (20 mg total) by mouth daily.   folic acid  1 MG tablet Commonly known as: FOLVITE  Take 1 tablet (1 mg total) by mouth daily.   levETIRAcetam 500 MG tablet Commonly known as: KEPPRA Take 1 tablet (500 mg total) by mouth 2 (two) times daily.   lisinopril  20 MG tablet Commonly known as: ZESTRIL  Take 1 tablet (20 mg total) by mouth daily.   magnesium  oxide 400 (240 Mg) MG tablet Commonly known as: MagOx 400 Take 1 tablet (400 mg total) by mouth daily.   pantoprazole  40 MG tablet Commonly known as: PROTONIX  Take 1 tablet (40 mg total) by mouth daily.   potassium chloride  20  MEQ/15ML (10%) Soln Take 30 mLs (40 mEq total) by mouth daily for 20 days.   UNABLE TO FIND Med Name: Adult diapers,Pads,Wipes. DX:R32        Follow-up Information     Meldon Sport, MD. Schedule an appointment as soon as possible for a visit in 1 week(s).   Specialty: Internal Medicine Contact information: [address] [place] [address] [phone]         [facility]  [facility]. Go to.   Specialty: Neurology Contact information: [address] [place]  [address] [phone]               No Known Allergies  Consultations: Case discussed with neurology   Procedures/Studies: DG Femur Min 2 Views Left Result Date: 09/29/2023 CLINICAL DATA:  Left hip pain after possible fall. EXAM: LEFT FEMUR 2 VIEWS COMPARISON:  None Available. FINDINGS: There is no evidence of fracture or other focal bone lesions. Soft tissues are unremarkable. IMPRESSION: Negative. Electronically Signed   By: Rosalene Colon M.D.   On: 09/29/2023 09:14   DG Pelvis 1-2 Views Result Date: 09/29/2023 CLINICAL DATA:  Fall. EXAM: PELVIS - 1-2 VIEW COMPARISON:  None Available. FINDINGS: There is no evidence of pelvic fracture or diastasis. No pelvic bone lesions are seen. IMPRESSION: Negative. Electronically Signed   By: Rosalene Colon M.D.   On: 09/29/2023 09:13   DG Chest Portable 1 View Result Date: 09/29/2023 CLINICAL DATA:  Altered mental status. EXAM: PORTABLE CHEST 1 VIEW COMPARISON:  April 11, 2021. FINDINGS: The heart size and mediastinal contours are within normal limits. Both lungs are clear. The visualized skeletal structures are unremarkable. IMPRESSION: No active disease. Electronically Signed   By: Rosalene Colon M.D.   On: 09/29/2023 09:11   CT Head Wo Contrast Result Date: 09/29/2023 CLINICAL DATA:  Head trauma. Altered mental status. Found by family on the floor. EXAM: CT HEAD WITHOUT CONTRAST TECHNIQUE: Contiguous axial images were obtained from the base of the skull through the vertex without intravenous contrast. RADIATION DOSE REDUCTION: This exam was performed according to the departmental dose-optimization program which includes automated exposure control, adjustment of the mA and/or kV according to patient size and/or use of iterative reconstruction technique. COMPARISON:  04/11/2021 FINDINGS: Brain: No evidence of  acute infarction, hemorrhage, hydrocephalus, extra-axial collection or mass lesion/mass effect. 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 Vascular: No hyperdense vessel or unexpected calcification. Skull: Normal. Negative for fracture or focal lesion. Sinuses/Orbits: Paranasal sinuses and mastoid air cells are clear. Other: None IMPRESSION: 1. No acute intracranial abnormalities. 2. 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 Electronically Signed   By: Kimberley Penman M.D.   On: 09/29/2023 08:46     Discharge Exam: Vitals:   10/04/23 0412 10/04/23 1422  BP: 118/63 102/69  Pulse: 76 81  Resp:  18  Temp: 97.8 F (36.6 C) 98.3 F (36.8 C)  SpO2: 100% 100%   Vitals:   10/03/23 2022 10/03/23 2200 10/04/23 0412 10/04/23 1422  BP: (!) 110/59  118/63 102/69  Pulse: 80  76 81  Resp:    18  Temp: 98.1 F (36.7 C)  97.8 F (36.6 C) 98.3 F (36.8 C)  TempSrc: Oral  Oral Oral  SpO2: 100% 100% 100% 100%  Weight:      Height:        General: Pt is alert, awake, not in acute distress Cardiovascular: RRR, S1/S2 +, no rubs, no gallops Respiratory: CTA bilaterally,  no wheezing, no rhonchi Abdominal: Soft, NT, ND, bowel sounds + Extremities: no edema, no cyanosis    The results of significant diagnostics from this hospitalization (including imaging, microbiology, ancillary and laboratory) are listed below for reference.     Microbiology: Recent Results (from the past 240 hours)  Urine Culture     Status: None   Collection Time: 09/29/23  8:32 AM   Specimen: Urine, Clean Catch  Result Value Ref Range Status   Specimen Description   Final    URINE, CLEAN CATCH Performed at [facility], [address], [place], [place] [address]    Special Requests   Final    NONE Performed at [facility], [address], [place], [place] [address]    Culture   Final    NO GROWTH Performed at [facility], [address] [place], [place] [address]    Report Status 09/30/2023 FINAL  Final      Labs: BNP (last 3 results) No results for input(s): "BNP" in the last 8760 hours. Basic Metabolic Panel: Recent Labs  Lab 10/01/23 0348 10/02/23 0243 10/02/23 0950 10/03/23 0525 10/04/23 0424  NA 134* 135 132* 136 136  K 4.7 5.3* 4.9 4.5 4.2  CL 106 107 106 105 108  CO2 25 22 21* 25 23  GLUCOSE 98 78 111* 91 86  BUN 8 7 7 7 7   CREATININE 0.77 0.81 0.82 0.71 0.72  CALCIUM  8.5* 8.2* 7.9* 8.2* 8.0*  MG 1.7  --   --   --  1.7  PHOS 2.0* 3.1  --   --   --    Liver Function Tests: Recent Labs  Lab 09/29/23 0756  AST 40  ALT 17  ALKPHOS 46  BILITOT 1.2  PROT 7.0  ALBUMIN 2.3*   No results for input(s): "LIPASE", "AMYLASE" in the last 168 hours. Recent Labs  Lab 09/29/23 0959 10/01/23 0348 10/02/23 0243  AMMONIA 80* 53* 40*   CBC: Recent Labs  Lab 09/30/23 0758 10/01/23 0348 10/02/23 0243 10/03/23 0525 10/04/23 0424  WBC 7.6 8.8 10.3 9.7 9.1  HGB 9.8* 9.6* 9.5* 9.8* 9.2*  HCT 29.3* 28.4* 28.2* 29.3* 27.3*  MCV 102.8* 102.5* 102.2* 101.4* 101.9*  PLT 157 179 189 188 174   Cardiac Enzymes: Recent Labs  Lab 09/29/23 0756  CKTOTAL 53   BNP: Invalid input(s): "POCBNP" CBG: Recent Labs  Lab 09/29/23 0750  GLUCAP 106*   D-Dimer No results for input(s): "DDIMER" in the last 72 hours. Hgb A1c No results for input(s): "HGBA1C" in the last 72 hours. Lipid Profile No results for input(s): "CHOL", "HDL", "LDLCALC", "TRIG", "CHOLHDL", "LDLDIRECT" in the last 72 hours. Thyroid function studies No results for input(s): "TSH", "T4TOTAL", "T3FREE", "THYROIDAB" in the last 72 hours.  Invalid input(s): "FREET3" Anemia work up No results for input(s): "VITAMINB12", "FOLATE", "FERRITIN", "TIBC", "IRON ", "RETICCTPCT" in the last 72 hours. Urinalysis    Component Value Date/Time   COLORURINE YELLOW 09/29/2023 0832   APPEARANCEUR HAZY (A) 09/29/2023 0832   LABSPEC 1.012 09/29/2023 0832   PHURINE 5.0 09/29/2023 0832   GLUCOSEU NEGATIVE 09/29/2023 0832    HGBUR SMALL (A) 09/29/2023 0832   BILIRUBINUR NEGATIVE 09/29/2023 0832   KETONESUR NEGATIVE 09/29/2023 0832   PROTEINUR NEGATIVE 09/29/2023 0832   UROBILINOGEN 0.2 05/28/2013 0033   NITRITE POSITIVE (A) 09/29/2023 0832   LEUKOCYTESUR NEGATIVE 09/29/2023 0832   Sepsis Labs Recent Labs  Lab 10/01/23 0348 10/02/23 0243 10/03/23 0525 10/04/23 0424  WBC 8.8 10.3  9.7 9.1   Microbiology Recent Results (from the past 240 hours)  Urine Culture     Status: None   Collection Time: 09/29/23  8:32 AM   Specimen: Urine, Clean Catch  Result Value Ref Range Status   Specimen Description   Final    URINE, CLEAN CATCH Performed at [facility], [address] [place], [place] [address]    Special Requests   Final    NONE Performed at [facility], [address] [place], [place] [address]    Culture   Final    NO GROWTH Performed at [facility], [address], [place], [place] [address]    Report Status 09/30/2023 FINAL  Final     Time coordinating discharge: 35 minutes  SIGNED:   Cornelius Dill, DO [facility] 10/04/2023, 2:36 PM  If 7PM-7AM, please contact night-coverage [email or web address]

## 2023-10-04 NOTE — Plan of Care (Signed)

## 2023-10-04 NOTE — TOC Transition Note (Signed)
 Transition of Care [facility]) - Discharge Note   Patient Details  Name: Cynthia Mclean MRN: [id number] Date of Birth: 1965/05/29  [facility] CM/SW Contact:  Orelia Binet, RN Phone Number: 10/04/2023, 2:56 PM   Clinical Narrative:   Patient discharging home. PT is recommending HHPT. Patient is active with [facility]. Cory updated.    Final next level of care: Home w Home Health Services Barriers to Discharge: Barriers Resolved   Patient Goals and CMS Choice Patient states their goals for this hospitalization and ongoing recovery are:: going home with home health.   Discharge Placement            Patient and family notified of of transfer: 10/04/23  Discharge Plan and Services Additional resources added to the After Visit Summary for          Social Drivers of Health (SDOH) Interventions SDOH Screenings   Food Insecurity: Patient Unable To Answer (09/29/2023)  Housing: Patient Unable To Answer (09/29/2023)  Transportation Needs: Patient Unable To Answer (09/29/2023)  Utilities: Patient Unable To Answer (09/29/2023)  Alcohol Screen: Low Risk  (01/19/2023)  Depression (PHQ2-9): Low Risk  (05/09/2023)  Financial Resource Strain: Low Risk  (01/19/2023)  Physical Activity: Inactive (01/19/2023)  Social Connections: Moderately Isolated (01/19/2023)  Stress: No Stress Concern Present (01/19/2023)  Tobacco Use: High Risk (09/29/2023)  Health Literacy: Adequate Health Literacy (01/19/2023)     Readmission Risk Interventions    09/02/2023    2:11 PM  Readmission Risk Prevention Plan  Medication Screening Complete  Transportation Screening Complete

## 2023-10-04 NOTE — Progress Notes (Signed)
 Called daughter, Laretta Pleasure, to come pick up patient to go home.

## 2023-10-04 NOTE — Evaluation (Signed)
 Physical Therapy Evaluation Patient Details Name: Cynthia Mclean MRN: [id number] DOB: 1965/05/31 Today's Date: 10/04/2023  History of Present Illness  Cynthia Mclean is a 58 y.o. female with medical history significant of history of seizure disorders, prior CVA, hypertension, hyperlipidemia, GERD and hypokalemia; who presented to the hospital after she was found on the floor by her daughter and noticing altered mentation.  Patient reports no dysuria but has present frequency; there was some information reported to EDP regarding seeing some blood in her mouth at home; none seen here.     No fever, no chest pain, no shortness of breath, no nausea, no vomiting, no sick contacts or any other complaints has been reported.     CT scan of the head demonstrated no acute intracranial normalities; for the most part stable blood work except for mild hypokalemia (3.2).Aaron Aas  Urinalysis with positive nitrite and positive bacteria.  Patient's ammonia level around 80.      Cultures taken, Rocephin  started and TRH contacted to place patient in the hospital for further evaluation and management.   Clinical Impression  Patient functioning near baseline for functional mobility and gait with fair/good return for sitting up at bedside, able to balance mostly on LLE during transfer to chair, but poor carryover for reaching behind before sitting in chair resulting in flopping into chair. Patient tolerated sitting up in chair after therapy - nursing staff notified. PLAN:  Patient to be discharged home today and discharged from acute physical therapy to care of nursing for out of bed as tolerated for length of stay with recommendations stated below           If plan is discharge home, recommend the following: Help with stairs or ramp for entrance;Assistance with cooking/housework;A lot of help with bathing/dressing/bathroom;A lot of help with walking and/or transfers   Can travel by private vehicle        Equipment  Recommendations None recommended by PT  Recommendations for Other Services       Functional Status Assessment Patient has had a recent decline in their functional status and/or demonstrates limited ability to make significant improvements in function in a reasonable and predictable amount of time     Precautions / Restrictions Precautions Precautions: Fall Restrictions Weight Bearing Restrictions Per Provider Order: No      Mobility  Bed Mobility Overal bed mobility: Needs Assistance Bed Mobility: Supine to Sit     Supine to sit: Min assist     General bed mobility comments: increased time, labored movement    Transfers Overall transfer level: Needs assistance Equipment used: 1 person hand held assist Transfers: Sit to/from Stand, Bed to chair/wheelchair/BSC Sit to Stand: Min assist   Step pivot transfers: Min assist, Mod assist       General transfer comment: limited weight bearing on RLE due to baseline weakness    Ambulation/Gait Ambulation/Gait assistance: Mod assist Gait Distance (Feet): 2 Feet Assistive device: 1 person hand held assist Gait Pattern/deviations: Decreased step length - right, Decreased step length - left, Decreased stance time - right, Decreased stride length, Shuffle Gait velocity: slow     General Gait Details: limited to a couple of steps with mostly shuffling on RLE  Stairs            Wheelchair Mobility     Tilt Bed    Modified Rankin (Stroke Patients Only)       Balance Overall balance assessment: Needs assistance Sitting-balance support: Feet supported, No upper extremity supported  Sitting balance-Leahy Scale: Fair Sitting balance - Comments: fair/good seated at EOB   Standing balance support: During functional activity, Single extremity supported Standing balance-Leahy Scale: Poor Standing balance comment: hand held assist                             Pertinent Vitals/Pain Pain Assessment Pain  Assessment: No/denies pain    Home Living Family/patient expects to be discharged to:: Private residence Living Arrangements: Children Available Help at Discharge: Family;Available 24 hours/day Type of Home: House Home Access: Level entry       Home Layout: One level Home Equipment: Wheelchair - manual;BSC/3in1;Grab bars - tub/shower      Prior Function Prior Level of Function : Needs assist             Mobility Comments: assisted transfers, uses wheelchair for mobility, non-ambulatory ADLs Comments: Assist for all other than feeding. Pt reports she can feed herself, but likely requires set up assist.     Extremity/Trunk Assessment   Upper Extremity Assessment Upper Extremity Assessment: Defer to OT evaluation    Lower Extremity Assessment Lower Extremity Assessment: Generalized weakness;RLE deficits/detail RLE Deficits / Details: grossly -3/5 (baseline) RLE Sensation: decreased light touch RLE Coordination: decreased fine motor    Cervical / Trunk Assessment Cervical / Trunk Assessment: Normal  Communication   Communication Communication: No apparent difficulties    Cognition Arousal: Alert Behavior During Therapy: Anxious, WFL for tasks assessed/performed   PT - Cognitive impairments: No family/caregiver present to determine baseline                         Following commands: Intact       Cueing Cueing Techniques: Verbal cues, Tactile cues     General Comments      Exercises     Assessment/Plan    PT Assessment All further PT needs can be met in the next venue of care  PT Problem List Decreased strength;Decreased activity tolerance;Decreased balance;Decreased mobility       PT Treatment Interventions      PT Goals (Current goals can be found in the Care Plan section)  Acute Rehab PT Goals Patient Stated Goal: return home with family to assist PT Goal Formulation: With patient Time For Goal Achievement: 10/04/23 Potential to  Achieve Goals: Good    Frequency       Co-evaluation               AM-PAC PT "6 Clicks" Mobility  Outcome Measure Help needed turning from your back to your side while in a flat bed without using bedrails?: A Little Help needed moving from lying on your back to sitting on the side of a flat bed without using bedrails?: A Little Help needed moving to and from a bed to a chair (including a wheelchair)?: A Lot Help needed standing up from a chair using your arms (e.g., wheelchair or bedside chair)?: A Little Help needed to walk in hospital room?: A Lot Help needed climbing 3-5 steps with a railing? : Total 6 Click Score: 14    End of Session Equipment Utilized During Treatment: Oxygen Activity Tolerance: Patient tolerated treatment well;Patient limited by fatigue Patient left: in chair;with call bell/phone within reach Nurse Communication: Mobility status PT Visit Diagnosis: Unsteadiness on feet (R26.81);Other abnormalities of gait and mobility (R26.89);Muscle weakness (generalized) (M62.81)    Time: 1105-1130 PT Time Calculation (min) (ACUTE ONLY): 25 min  Charges:   PT Evaluation $PT Eval Moderate Complexity: 1 Mod PT Treatments $Therapeutic Activity: 23-37 mins PT General Charges $$ ACUTE PT VISIT: 1 Visit         3:29 PM, 10/04/23 Walton Guppy, MPT Physical Therapist with [facility] [phone] office [phone] mobile phone

## 2023-10-05 ENCOUNTER — Telehealth: Payer: Self-pay

## 2023-10-05 NOTE — Transitions of Care (Post Inpatient/ED Visit) (Signed)
 10/05/2023  Name: Cynthia Mclean MRN: [id number] DOB: 1966-02-18  Today's TOC FU Call Status: Today's TOC FU Call Status:: Successful TOC FU Call Completed TOC FU Call Complete Date: 10/05/23 Patient's Name and Date of Birth confirmed.  Transition Care Management Follow-up Telephone Call Date of Discharge: 10/04/23 [facility] Type of Discharge: Inpatient Admission Primary Inpatient Discharge Diagnosis:: alterd mental status How have you been since you were released from the hospital?: Better Any questions or concerns?: No  Items Reviewed: Did you receive and understand the discharge instructions provided?: Yes Medications obtained,verified, and reconciled?: Yes (Medications Reviewed) Any new allergies since your discharge?: No Dietary orders reviewed?: Yes Do you have support at home?: Yes People in Home [RPT]: child(ren), adult  Medications Reviewed Today: Medications Reviewed Today     Reviewed by Darrall Ellison, LPN (Licensed Practical Nurse) on 10/05/23 at 1135  Med List Status: <None>   Medication Order Taking? Sig Documenting Provider Last Dose Status Informant  aspirin  EC 81 MG tablet [id number] No Take 1 tablet (81 mg total) by mouth daily. Swallow whole. Justina Oman, MD 09/28/2023 Morning Active Child, Pharmacy Records  atorvastatin  (LIPITOR) 20 MG tablet [id number] No Take 1 tablet (20 mg total) by mouth daily.  Patient not taking: Reported on 09/01/2023   Meldon Sport, MD Not Taking Active Child, Pharmacy Records  folic acid  (FOLVITE ) 1 MG tablet [id number] No Take 1 tablet (1 mg total) by mouth daily. Justina Oman, MD 09/28/2023 Morning Active Child, Pharmacy Records  levETIRAcetam (KEPPRA) 500 MG tablet [id number]  Take 1 tablet (500 mg total) by mouth 2 (two) times daily. Mason Sole, Pratik D, DO  Active   lisinopril  (ZESTRIL ) 20 MG tablet [id number] No Take 1 tablet (20 mg total) by mouth daily. Justina Oman, MD 09/28/2023 Morning Active Child,  Pharmacy Records  magnesium  oxide (MAGOX 400) 400 (240 Mg) MG tablet [id number] No Take 1 tablet (400 mg total) by mouth daily. Justina Oman, MD 09/28/2023 Morning Active Child, Pharmacy Records  pantoprazole  (PROTONIX ) 40 MG tablet [id number] No Take 1 tablet (40 mg total) by mouth daily. Justina Oman, MD 09/28/2023 Morning Active Child, Pharmacy Records  potassium chloride  20 MEQ/15ML (10%) SOLN [id number] No Take 30 mLs (40 mEq total) by mouth daily for 20 days. Justina Oman, MD 09/28/2023 Morning Expired 09/29/23 2359 Child, Pharmacy Records  UNABLE TO FIND [id number] No Med Name: Adult diapers,Pads,Wipes. DX:R32 Meldon Sport, MD Taking Active Child, Pharmacy Records            Home Care and Equipment/Supplies: Were Home Health Services Ordered?: Yes Name of Home Health Agency:: [facility] Has Agency set up a time to come to your home?: No Any new equipment or medical supplies ordered?: NA  Functional Questionnaire: Do you need assistance with bathing/showering or dressing?: Yes Do you need assistance with meal preparation?: Yes Do you need assistance with eating?: No Do you have difficulty maintaining continence: No Do you need assistance with getting out of bed/getting out of a chair/moving?: No Do you have difficulty managing or taking your medications?: Yes  Follow up appointments reviewed: PCP Follow-up appointment confirmed?: Yes Date of PCP follow-up appointment?: 10/10/23 Follow-up Provider: [facility] Follow-up appointment confirmed?: No Reason Specialist Follow-Up Not Confirmed: Patient has Specialist Provider Number and will Call for Appointment Do you need transportation to your follow-up appointment?: No Do you understand care options if your condition(s) worsen?: Yes-patient verbalized understanding    SIGNATURE Darrall Ellison, LPN [facility] Nurse Health Advisor Direct  Dial [phone]

## 2023-10-07 ENCOUNTER — Telehealth: Payer: Self-pay | Admitting: Internal Medicine

## 2023-10-07 NOTE — Telephone Encounter (Unsigned)
 Copied from CRM [id number]. Topic: Clinical - Home Health Verbal Orders >> Oct 06, 2023  2:31 PM Ivette P wrote: Caller/Agency: [facility] Phyisical Therapist Callback Number: [phone] Service Requested: Physical Therapy Frequency: pt was discharged from Hospital on Tuesday 10/04/2023 and will be resuming care.   Resume care 1 week 1, 2 week 2, 1 week 3  Any new concerns about the patient? Yes, started on Keppra  and discontinued the Dilaudid

## 2023-10-10 ENCOUNTER — Inpatient Hospital Stay: Payer: Self-pay | Admitting: Internal Medicine

## 2023-10-10 NOTE — Telephone Encounter (Signed)
Spoke to Weiner to approve orders.

## 2023-10-12 ENCOUNTER — Other Ambulatory Visit: Payer: Self-pay | Admitting: Internal Medicine

## 2023-10-12 DIAGNOSIS — E782 Mixed hyperlipidemia: Secondary | ICD-10-CM

## 2023-10-12 DIAGNOSIS — R569 Unspecified convulsions: Secondary | ICD-10-CM

## 2023-10-13 ENCOUNTER — Telehealth: Payer: Self-pay

## 2023-10-13 ENCOUNTER — Other Ambulatory Visit: Payer: Self-pay | Admitting: Internal Medicine

## 2023-10-13 DIAGNOSIS — I10 Essential (primary) hypertension: Secondary | ICD-10-CM

## 2023-10-13 NOTE — Telephone Encounter (Signed)
 Copied from CRM [id number]. Topic: Appointments - Scheduling Inquiry for Clinic >> Oct 13, 2023  1:59 PM Leory Rands wrote: Reason for CRM: Patient is calling to reschedule hospital follow up. No available appointment with PCP within 14 days. Please advise with the patient.

## 2023-10-13 NOTE — Telephone Encounter (Signed)
 If Dr [place] Gavia it then it fine. TOC is usually 11 am.

## 2023-10-18 ENCOUNTER — Encounter: Payer: Self-pay | Admitting: Internal Medicine

## 2023-10-18 ENCOUNTER — Ambulatory Visit (INDEPENDENT_AMBULATORY_CARE_PROVIDER_SITE_OTHER): Admitting: Internal Medicine

## 2023-10-18 VITALS — BP 138/84 | HR 82 | Resp 16

## 2023-10-18 DIAGNOSIS — G8194 Hemiplegia, unspecified affecting left nondominant side: Secondary | ICD-10-CM

## 2023-10-18 DIAGNOSIS — R4182 Altered mental status, unspecified: Secondary | ICD-10-CM | POA: Diagnosis not present

## 2023-10-18 DIAGNOSIS — D649 Anemia, unspecified: Secondary | ICD-10-CM | POA: Insufficient documentation

## 2023-10-18 DIAGNOSIS — R569 Unspecified convulsions: Secondary | ICD-10-CM

## 2023-10-18 DIAGNOSIS — I10 Essential (primary) hypertension: Secondary | ICD-10-CM

## 2023-10-18 DIAGNOSIS — E876 Hypokalemia: Secondary | ICD-10-CM

## 2023-10-18 DIAGNOSIS — D539 Nutritional anemia, unspecified: Secondary | ICD-10-CM

## 2023-10-18 DIAGNOSIS — Z09 Encounter for follow-up examination after completed treatment for conditions other than malignant neoplasm: Secondary | ICD-10-CM | POA: Diagnosis not present

## 2023-10-18 DIAGNOSIS — E782 Mixed hyperlipidemia: Secondary | ICD-10-CM

## 2023-10-18 DIAGNOSIS — K219 Gastro-esophageal reflux disease without esophagitis: Secondary | ICD-10-CM

## 2023-10-18 MED ORDER — PANTOPRAZOLE SODIUM 40 MG PO TBEC: 40.0000 mg | DELAYED_RELEASE_TABLET | Freq: Every day | ORAL | 1 refills | Status: AC

## 2023-10-18 MED ORDER — LEVETIRACETAM 500 MG PO TABS: 500.0000 mg | ORAL_TABLET | Freq: Two times a day (BID) | ORAL | 5 refills | Status: AC

## 2023-10-18 NOTE — Assessment & Plan Note (Signed)
 BP Readings from Last 1 Encounters:  10/18/23 138/84   Well-controlled with Lisinopril  20 mg Recently decreased dose of lisinopril  from 40 mg to 20 mg and DCed Amlodipine  during recent hospitalization due to hypotension Counseled for compliance with the medications Advised DASH diet and moderate exercise/walking as tolerated

## 2023-10-18 NOTE — Progress Notes (Signed)
 Established Patient Office Visit  Subjective:  Patient ID: Cynthia Mclean, female    DOB: 11/19/1965  Age: 58 y.o. MRN: [id number]  CC:  Chief Complaint  Patient presents with   Hospitalization Follow-up    Discharged 6/10 altered mental status     HPI PEPPER KERRICK is a 58 y.o. 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  She presented to the hospital after she was found on the floor by her daughter and noticing altered mentation. She was admitted with acute metabolic encephalopathy likely multifactorial in the setting of UTI as well as supratherapeutic phenytoin  level.  She was also dehydrated on the day of admission.  She was given IV fluids and antibiotic for UTI.  She was also placed on Keppra  instead of phenytoin  after neurology consultation.  Her mental status improved during the hospitalization.  Today, she appears to be at baseline.  Denies any episode of seizures or altered mental status since being discharged from the hospital.  Past Medical History:  Diagnosis Date   Alcohol dependency (HCC)    HELICOBACTER PYLORI [H. PYLORI] INFECTION 02/24/2010   Qualifier: Diagnosis of  By: Ezzard DEVONNA Sonny GORMAN.    Hypertension    IDA (iron  deficiency anemia)    required blood tranfusion   Nicotine addiction    Seizure disorder [facility])     Past Surgical History:  Procedure Laterality Date   btl     left knee surgery, fracture      Family History  Problem Relation Age of Onset   Anxiety disorder Sister     Social History   Socioeconomic History   Marital status: Single    Spouse name: Not on file   Number of children: 5   Years of education: Not on file   Highest education level: Not on file  Occupational History   Occupation: works at shelter  Tobacco  Use   Smoking status: Never   Smokeless tobacco: Current    Types: Snuff  Substance and Sexual Activity   Alcohol use: Not Currently    Comment: last drank last night 1 40 oz   Drug use: No   Sexual activity: Yes    Birth control/protection: Surgical  Other Topics Concern   Not on file  Social History Narrative   Not on file   Social Drivers of Health   Financial Resource Strain: Low Risk  (01/19/2023)   Overall Financial Resource Strain (CARDIA)    Difficulty of Paying Living Expenses: Not hard at all  Food Insecurity: Patient Unable To Answer (09/29/2023)   Hunger Vital Sign    Worried About Running Out of Food in the Last Year: Patient unable to answer    Ran Out of Food in the Last Year: Patient unable to answer  Transportation Needs: Patient Unable To Answer (09/29/2023)   PRAPARE - Transportation    Lack of Transportation (Medical): Patient unable to answer    Lack of Transportation (Non-Medical): Patient unable to answer  Physical Activity: Inactive (01/19/2023)   Exercise Vital Sign    Days of Exercise per Week: 0 days    Minutes of Exercise per Session: 0 min  Stress: No Stress Concern Present (01/19/2023)   [employer] of Occupational Health - Occupational Stress Questionnaire    Feeling of Stress : Not at all  Social Connections: Moderately  Isolated (01/19/2023)   Social Connection and Isolation Panel    Frequency of Communication with Friends and Family: More than three times a week    Frequency of Social Gatherings with Friends and Family: More than three times a week    Attends Religious Services: More than 4 times per year    Active Member of [employer] or Organizations: No    Attends Banker Meetings: Never    Marital Status: Never married  Intimate Partner Violence: Patient Unable To Answer (09/29/2023)   Humiliation, Afraid, Rape, and Kick questionnaire    Fear of Current or Ex-Partner: Patient unable to answer    Emotionally Abused: Patient unable  to answer    Physically Abused: Patient unable to answer    Sexually Abused: Patient unable to answer    Outpatient Medications Prior to Visit  Medication Sig Dispense Refill   aspirin  EC 81 MG tablet Take 1 tablet (81 mg total) by mouth daily. Swallow whole. 30 tablet 3   atorvastatin  (LIPITOR) 20 MG tablet TAKE 1 TABLET(20 MG) BY MOUTH DAILY 90 tablet 3   folic acid  (FOLVITE ) 1 MG tablet Take 1 tablet (1 mg total) by mouth daily. 30 tablet 1   lisinopril  (ZESTRIL ) 20 MG tablet TAKE 1 TABLET(20 MG) BY MOUTH DAILY 90 tablet 1   magnesium  oxide (MAGOX 400) 400 (240 Mg) MG tablet Take 1 tablet (400 mg total) by mouth daily. 30 tablet 1   UNABLE TO FIND Med Name: Adult diapers,Pads,Wipes. DX:R32 1 each 99   levETIRAcetam  (KEPPRA ) 500 MG tablet Take 1 tablet (500 mg total) by mouth 2 (two) times daily. 60 tablet 3   pantoprazole  (PROTONIX ) 40 MG tablet Take 1 tablet (40 mg total) by mouth daily. 30 tablet 1   potassium chloride  20 MEQ/15ML (10%) SOLN Take 30 mLs (40 mEq total) by mouth daily for 20 days. 600 mL 0   No facility-administered medications prior to visit.    No Known Allergies  ROS Review of Systems  Constitutional:  Negative for chills and fever.  HENT:  Negative for congestion, sinus pressure, sinus pain and sore throat.   Eyes:  Negative for pain and discharge.  Respiratory:  Negative for cough and shortness of breath.   Cardiovascular:  Negative for chest pain and palpitations.  Gastrointestinal:  Negative for abdominal pain, diarrhea, nausea and vomiting.  Endocrine: Negative for polydipsia and polyuria.  Genitourinary:  Negative for dysuria and hematuria.  Musculoskeletal:  Positive for arthralgias (Left hip). Negative for neck pain and neck stiffness.  Skin:  Negative for rash.  Neurological:  Positive for seizures, weakness and numbness. Negative for dizziness.  Psychiatric/Behavioral:  Positive for sleep disturbance. Negative for agitation and behavioral problems.        Objective:    Physical Exam Vitals reviewed.  Constitutional:      General: She is not in acute distress.    Appearance: She is not diaphoretic.     Comments: In wheelchair  HENT:     Head: Normocephalic and atraumatic.     Nose: Nose normal. No congestion.     Mouth/Throat:     Mouth: Mucous membranes are moist.     Pharynx: No posterior oropharyngeal erythema.   Eyes:     General: No scleral icterus.    Extraocular Movements: Extraocular movements intact.    Cardiovascular:     Rate and Rhythm: Normal rate and regular rhythm.     Heart sounds: Normal heart sounds. No murmur heard. Pulmonary:  Breath sounds: Normal breath sounds. No wheezing or rales.   Musculoskeletal:     Cervical back: Neck supple. No tenderness.     Left hip: No tenderness. Decreased range of motion.     Right lower leg: Edema (Mild) present.     Left lower leg: Edema (Mild) present.   Skin:    General: Skin is warm.     Findings: No rash.   Neurological:     General: No focal deficit present.     Mental Status: She is alert and oriented to person, place, and time.     Sensory: No sensory deficit.     Motor: Weakness (Left UE and LE, muscle strength 3/5) present.     Gait: Gait abnormal.   Psychiatric:        Mood and Affect: Mood normal.        Behavior: Behavior normal.     BP 138/84   Pulse 82   Resp 16   LMP 05/14/2013   SpO2 99%  Wt Readings from Last 3 Encounters:  09/29/23 121 lb 0.5 oz (54.9 kg)  09/01/23 129 lb 10.1 oz (58.8 kg)  05/09/23 130 lb (59 kg)    Lab Results  Component Value Date   TSH 2.826 09/29/2023   Lab Results  Component Value Date   WBC 9.1 10/04/2023   HGB 9.2 (L) 10/04/2023   HCT 27.3 (L) 10/04/2023   MCV 101.9 (H) 10/04/2023   PLT 174 10/04/2023   Lab Results  Component Value Date   NA 136 10/04/2023   K 4.2 10/04/2023   CO2 23 10/04/2023   GLUCOSE 86 10/04/2023   BUN 7 10/04/2023   CREATININE 0.72 10/04/2023   BILITOT 1.2  09/29/2023   ALKPHOS 46 09/29/2023   AST 40 09/29/2023   ALT 17 09/29/2023   PROT 7.0 09/29/2023   ALBUMIN 2.3 (L) 09/29/2023   CALCIUM  8.0 (L) 10/04/2023   ANIONGAP 5 10/04/2023   EGFR 94 05/09/2023   Lab Results  Component Value Date   CHOL 140 11/16/2022   Lab Results  Component Value Date   HDL 68 11/16/2022   Lab Results  Component Value Date   LDLCALC 60 11/16/2022   Lab Results  Component Value Date   TRIG 53 11/16/2022   Lab Results  Component Value Date   CHOLHDL 2.1 11/16/2022   Lab Results  Component Value Date   HGBA1C 5.0 11/16/2022      Assessment & Plan:   Problem List Items Addressed This Visit       Cardiovascular and Mediastinum   Essential hypertension   BP Readings from Last 1 Encounters:  10/18/23 138/84   Well-controlled with Lisinopril  20 mg Recently decreased dose of lisinopril  from 40 mg to 20 mg and DCed Amlodipine  during recent hospitalization due to hypotension Counseled for compliance with the medications Advised DASH diet and moderate exercise/walking as tolerated      Relevant Orders   Basic Metabolic Panel (BMET)     Digestive   Gastroesophageal reflux disease   Overall better controlled with pantoprazole  40 mg QD      Relevant Medications   pantoprazole  (PROTONIX ) 40 MG tablet     Nervous and Auditory   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)   Due to Todd's paralysis Has a history of seizure disorder On Keppra  currently        Other   Seizures (HCC) (Chronic)   On Keppra  500 mg BID  DC Phenytoin  300 mg once daily as she had supratherapeutic levels during recent hospitalization Has Neurology appointment now      Relevant Medications   levETIRAcetam  (KEPPRA ) 500 MG tablet   Mixed hyperlipidemia (Chronic)   Continue Lipitor Checked lipid profile      Hypokalemia   Was placed on potassium chloride  liquid supplement Last CMP shows K: 4.2 Advised to stop taking  potassium supplement for now      AMS (altered mental status) - Primary   Likely multifactorial, due to acute metabolic encephalopathy due to UTI, supratherapeutic level of phenytoin  an/or recent episode of seizure Was given IV fluids and antibiotics for UTI, improved her symptoms DCed phenytoin  and has been placed on Keppra  instead Has follow-up with neurology Appears to be at baseline mental status now       Anemia   Last CBC showed Hb: 9.2, MCV: 101.9 Has a history of alcohol abuse, could be folic acid  and vitamin B12 deficiency Takes folic acid  supplement currently Check folic acid  and vitamin B12 levels Check CBC and iron  panels      Hospital discharge follow-up   Hospital chart reviewed, including discharge summary Medications reconciled and reviewed with the patient in detail       Meds ordered this encounter  Medications   levETIRAcetam  (KEPPRA ) 500 MG tablet    Sig: Take 1 tablet (500 mg total) by mouth 2 (two) times daily.    Dispense:  60 tablet    Refill:  5   pantoprazole  (PROTONIX ) 40 MG tablet    Sig: Take 1 tablet (40 mg total) by mouth daily.    Dispense:  90 tablet    Refill:  1    Follow-up: No follow-ups on file.    Suzzane MARLA Blanch, MD

## 2023-10-18 NOTE — Patient Instructions (Signed)
 Please stop taking potassium supplement for now.  Please continue to take medications as prescribed.  Please continue to follow low salt diet.  Please get blood tests done before the next visit.

## 2023-10-18 NOTE — Assessment & Plan Note (Signed)
 Last CBC showed Hb: 9.2, MCV: 101.9 Has a history of alcohol abuse, could be folic acid  and vitamin B12 deficiency Takes folic acid  supplement currently Check folic acid  and vitamin B12 levels Check CBC and iron  panels

## 2023-10-18 NOTE — Assessment & Plan Note (Addendum)
 Likely multifactorial, due to acute metabolic encephalopathy due to UTI, supratherapeutic level of phenytoin  an/or recent episode of seizure Was given IV fluids and antibiotics for UTI, improved her symptoms DCed phenytoin  and has been placed on Keppra  instead Has follow-up with neurology Appears to be at baseline mental status now

## 2023-10-18 NOTE — Assessment & Plan Note (Signed)
 Was placed on potassium chloride  liquid supplement Last CMP shows K: 4.2 Advised to stop taking potassium supplement for now

## 2023-10-18 NOTE — Assessment & Plan Note (Signed)
 Hospital chart reviewed, including discharge summary Medications reconciled and reviewed with the patient in detail

## 2023-10-18 NOTE — Assessment & Plan Note (Signed)
Continue Lipitor Checked lipid profile

## 2023-10-18 NOTE — Assessment & Plan Note (Signed)
 Overall better controlled with pantoprazole  40 mg QD

## 2023-10-18 NOTE — Assessment & Plan Note (Signed)
 On Keppra  500 mg BID DC Phenytoin  300 mg once daily as she had supratherapeutic levels during recent hospitalization Has Neurology appointment now

## 2023-10-18 NOTE — Assessment & Plan Note (Addendum)
 Due to Todd's paralysis Has a history of seizure disorder On Keppra  currently

## 2023-11-03 ENCOUNTER — Ambulatory Visit

## 2023-11-03 ENCOUNTER — Encounter (HOSPITAL_COMMUNITY): Payer: Self-pay | Admitting: Internal Medicine

## 2023-11-03 VITALS — Ht 66.0 in | Wt 120.0 lb

## 2023-11-03 DIAGNOSIS — Z0000 Encounter for general adult medical examination without abnormal findings: Secondary | ICD-10-CM | POA: Diagnosis not present

## 2023-11-03 DIAGNOSIS — Z124 Encounter for screening for malignant neoplasm of cervix: Secondary | ICD-10-CM

## 2023-11-03 DIAGNOSIS — Z1231 Encounter for screening mammogram for malignant neoplasm of breast: Secondary | ICD-10-CM

## 2023-11-03 DIAGNOSIS — Z0100 Encounter for examination of eyes and vision without abnormal findings: Secondary | ICD-10-CM

## 2023-11-03 DIAGNOSIS — Z1211 Encounter for screening for malignant neoplasm of colon: Secondary | ICD-10-CM

## 2023-11-03 NOTE — Patient Instructions (Signed)
 Cynthia Mclean ,  Thank you for taking time out of your busy schedule to complete your Annual Wellness Visit with me. I enjoyed our conversation and look forward to speaking with you again next year. I, as well as your care team,  appreciate your ongoing commitment to your health goals. Please review the following plan we discussed and let me know if I can assist you in the future.  I enjoyed our conversation and look forward to it again next year. Blessing for the upcoming year!!  -Jamiee Milholland  Your Game plan/ To Do List   Referrals Placed:  Lab Orders         Cologuard    A Cologuard was ordered for you today. It's a colon cancer screening test that will arrive at your home soon. Cologuard is an easy-to-use, noninvasive test. You collect a sample in your own home and send it back for testing.  Cynthia Mclean is $0 cost to you because Cologuard is a benefit to you.  -Your Cologuard kit will arrive in a white box. -Follow the instructions inside.  No prep is required.  -Return the kit via UPS prepaid shipping.  You'll use the same box it arrived in.  -Your result should be available within two weeks.  -Want to view a how-to-use video and get help returning your Cologuard Kit? Go to [email or web address] or scan the QR code on this page.   You've been referred to see Dr. Adine Haddock for an eye exam. If you haven't heard from his office in about a week, please call to schedule your appointment.  Adine Haddock, MD [address] [place] [address]  Phone: [phone]  Cervical Cancer Screening/Pap Smear [facility] Address: [address], [place], [place] [address] Phone: [phone]  Mammogram: [facility] Phone: [phone]   Follow up Visits:  Next Office Visit with your Primary Care Provider: November 07, 2023 Medicare Wellness with Health Advisor (1 year): November 05, 2024 at 3:10pm telephone visit.     Clinician Recommendations:  Aim for 30 minutes of exercise or brisk  walking, 6-8 glasses of water, and 5 servings of fruits and vegetables each day.       This is a list of the screening recommended for you and due dates:  Health Maintenance  Topic Date Due   Hepatitis B Vaccine (1 of 3 - 19+ 3-dose series) Never done   Colon Cancer Screening  Never done   DTaP/Tdap/Td vaccine (2 - Tdap) 04/28/2011   Pap with HPV screening  01/18/2015   Mammogram  11/06/2021   COVID-19 Vaccine (1 - 2024-25 season) Never done   Flu Shot  11/25/2023   Medicare Annual Wellness Visit  11/02/2024   Hepatitis C Screening  Completed   HIV Screening  Completed   Zoster (Shingles) Vaccine  Completed   HPV Vaccine  Aged Out   Meningitis B Vaccine  Aged Out    Advanced directives: (Copy Requested) Please bring a copy of your health care power of attorney and living will to the office to be added to your chart at your convenience. You can mail to [facility] [address] [place], [place] [address] or email to [email or web address] Advance Care Planning is important because it:  [x]  Makes sure you receive the medical care that is consistent with your values, goals, and preferences  [x]  It provides guidance to your family and loved ones and reduces their decisional burden about whether  or not they are making the right decisions based on your wishes.  Follow the link provided in your after visit summary or read over the paperwork we have mailed to you to help you started getting your Advance Directives in place. If you need assistance in completing these, please reach out to us  so that we can help you!  If you choose to send your directives in yourself, the information to do so is below:  Please email a copy of your Advanced Healthcare Directives,such as your Healthcare Power of Attorney, Living Will or DNR status to the following secure email: [email or web address]

## 2023-11-03 NOTE — Progress Notes (Signed)
 Subjective:   Cynthia Mclean is a 58 y.o. who presents for a Medicare Wellness preventive visit.  As a reminder, Annual Wellness Visits don't include a physical exam, and some assessments may be limited, especially if this visit is performed virtually. We may recommend an in-person follow-up visit with your provider if needed.  Visit Complete: Virtual I connected with  Cynthia Mclean on 11/03/23 by a audio enabled telemedicine application and verified that I am speaking with the correct person using two identifiers.  Patient Location: Home  Provider Location: Office/Clinic  I discussed the limitations of evaluation and management by telemedicine. The patient expressed understanding and agreed to proceed.  Vital Signs: Because this visit was a virtual/telehealth visit, some criteria may be missing or patient reported. Any vitals not documented were not able to be obtained and vitals that have been documented are patient reported.  VideoDeclined- This patient declined Librarian, academic. Therefore the visit was completed with audio only.  Persons Participating in Visit: Patient assisted by Cynthia Mclean (daughter who is also her caregiver).  AWV Questionnaire: No: Patient Medicare AWV questionnaire was not completed prior to this visit.  Cardiac Risk Factors include: advanced age (>64men, >31 women);smoking/ tobacco exposure;Other (see comment), Risk factor comments: seizures     Objective:    Today's Vitals   11/03/23 1309  Weight: 120 lb (54.4 kg)  Height: 5' 6 (1.676 m)   Body mass index is 19.37 kg/m.     11/03/2023    1:08 PM 01/19/2023   11:39 AM 04/11/2021    5:16 PM 08/05/2020    1:09 PM 05/16/2020    9:38 AM 03/11/2020   11:00 PM 03/11/2020   12:00 PM  Advanced Directives  Does Patient Have a Medical Advance Directive? Yes Yes No No No No No  Type of Estate agent of [place];Living will Healthcare Power of [place];Living will        Copy of Healthcare Power of Attorney in Chart? No - copy requested No - copy requested       Would patient like information on creating a medical advance directive?    No - Patient declined  No - Patient declined No - Patient declined    Current Medications (verified) Outpatient Encounter Medications as of 11/03/2023  Medication Sig   aspirin  EC 81 MG tablet Take 1 tablet (81 mg total) by mouth daily. Swallow whole.   atorvastatin  (LIPITOR) 20 MG tablet TAKE 1 TABLET(20 MG) BY MOUTH DAILY   folic acid  (FOLVITE ) 1 MG tablet Take 1 tablet (1 mg total) by mouth daily.   levETIRAcetam  (KEPPRA ) 500 MG tablet Take 1 tablet (500 mg total) by mouth 2 (two) times daily.   lisinopril  (ZESTRIL ) 20 MG tablet TAKE 1 TABLET(20 MG) BY MOUTH DAILY   magnesium  oxide (MAGOX 400) 400 (240 Mg) MG tablet Take 1 tablet (400 mg total) by mouth daily.   pantoprazole  (PROTONIX ) 40 MG tablet Take 1 tablet (40 mg total) by mouth daily.   UNABLE TO FIND Med Name: Adult diapers,Pads,Wipes. DX:R32   No facility-administered encounter medications on file as of 11/03/2023.    Allergies (verified) Patient has no known allergies.   History: Past Medical History:  Diagnosis Date   Alcohol dependency (HCC)    HELICOBACTER PYLORI [H. PYLORI] INFECTION 02/24/2010   Qualifier: Diagnosis of  By: Ezzard DEVONNA Sonny GORMAN.    Hypertension    IDA (iron  deficiency anemia)    required blood tranfusion  Nicotine addiction    Seizure disorder [facility])    Past Surgical History:  Procedure Laterality Date   btl     left knee surgery, fracture     Family History  Problem Relation Age of Onset   Anxiety disorder Sister    Social History   Socioeconomic History   Marital status: Single    Spouse name: Not on file   Number of children: 5   Years of education: Not on file   Highest education level: Not on file  Occupational History   Occupation: works at shelter  Tobacco Use   Smoking status: Never   Smokeless  tobacco: Current    Types: Snuff  Substance and Sexual Activity   Alcohol use: Not Currently    Comment: last drank last night 1 40 oz   Drug use: No   Sexual activity: Yes    Birth control/protection: Surgical  Other Topics Concern   Not on file  Social History Narrative   Daughter Cynthia Mclean is patients caregiver. Dependent on daughter for most of ADL's   Social Drivers of Health   Financial Resource Strain: Low Risk  (11/03/2023)   Overall Financial Resource Strain (CARDIA)    Difficulty of Paying Living Expenses: Not hard at all  Food Insecurity: No Food Insecurity (11/03/2023)   Hunger Vital Sign    Worried About Running Out of Food in the Last Year: Never true    Ran Out of Food in the Last Year: Never true  Transportation Needs: No Transportation Needs (11/03/2023)   PRAPARE - Administrator, Civil Service (Medical): No    Lack of Transportation (Non-Medical): No  Physical Activity: Sufficiently Active (11/03/2023)   Exercise Vital Sign    Days of Exercise per Week: 7 days    Minutes of Exercise per Session: 30 min  Stress: No Stress Concern Present (11/03/2023)   [employer] of Occupational Health - Occupational Stress Questionnaire    Feeling of Stress: Not at all  Social Connections: Socially Isolated (11/03/2023)   Social Connection and Isolation Panel    Frequency of Communication with Friends and Family: More than three times a week    Frequency of Social Gatherings with Friends and Family: More than three times a week    Attends Religious Services: Never    Database administrator or Organizations: No    Attends Engineer, structural: Never    Marital Status: Never married    Tobacco Counseling Ready to quit: No Counseling given: Yes    Clinical Intake:  Pre-visit preparation completed: Yes  Pain : No/denies pain     BMI - recorded: 19.37 Nutritional Status: BMI of 19-24  Normal Nutritional Risks: None Diabetes: No  Lab  Results  Component Value Date   HGBA1C 5.0 11/16/2022   HGBA1C 5.3 09/28/2021     How often do you need to have someone help you when you read instructions, pamphlets, or other written materials from your doctor or pharmacy?: 1 - Never  Interpreter Needed?: No  Information entered by :: Stefano ORN [facility]   Activities of Daily Living     11/03/2023    1:16 PM 09/29/2023   11:00 PM  In your present state of health, do you have any difficulty performing the following activities:  Hearing? 0 0  Vision? 0 0  Difficulty concentrating or making decisions? 0 1  Walking or climbing stairs? 1   Dressing or bathing? 1   Doing errands, shopping?  1 0  Preparing Food and eating ? Y   Using the Toilet? Y   In the past six months, have you accidently leaked urine? N   Do you have problems with loss of bowel control? N   Managing your Medications? Y   Managing your Finances? Y   Housekeeping or managing your Housekeeping? Y     Patient Care Team: Tobie Suzzane POUR, MD as PCP - General (Internal Medicine)  I have updated your Care Teams any recent Medical Services you may have received from other providers in the past year.     Assessment:   This is a routine wellness examination for Cynthia Mclean.  Hearing/Vision screen Hearing Screening - Comments:: Patient denies any hearing difficulties.   Vision Screening - Comments:: Patient is not up to date on yearly eye exams.  Referral to ophthalmology placed.     Goals Addressed             This Visit's Progress    Patient Stated       Increase activity       Depression Screen     11/03/2023    1:20 PM 10/18/2023   11:28 AM 05/09/2023    1:09 PM 01/19/2023   11:38 AM 11/24/2022    1:24 PM 08/17/2022    1:31 PM 06/21/2022    3:52 PM  PHQ 2/9 Scores  PHQ - 2 Score 0 1 0 0 1 0 0  PHQ- 9 Score 0  0        Fall Risk     11/03/2023    1:15 PM 10/18/2023   11:28 AM 05/09/2023    1:09 PM 01/19/2023   11:37 AM 11/24/2022    1:24 PM  Fall Risk    Falls in the past year? 0 0 0 0 0  Number falls in past yr: 0 0 0 0 0  Injury with Fall? 0 0 0 0 0  Risk for fall due to : Impaired balance/gait;Impaired mobility Impaired balance/gait No Fall Risks No Fall Risks   Follow up Falls evaluation completed;Education provided;Falls prevention discussed Falls evaluation completed Falls evaluation completed Falls prevention discussed     MEDICARE RISK AT HOME:  Medicare Risk at Home Any stairs in or around the home?: No If so, are there any without handrails?: No Home free of loose throw rugs in walkways, pet beds, electrical cords, etc?: Yes Adequate lighting in your home to reduce risk of falls?: Yes Life alert?: No Use of a cane, walker or w/c?: Yes Grab bars in the bathroom?: Yes Shower chair or bench in shower?: Yes Elevated toilet seat or a handicapped toilet?: No  TIMED UP AND GO:  Was the test performed?  No  Cognitive Function: 6CIT completed        11/03/2023    1:18 PM 01/19/2023   11:40 AM  6CIT Screen  What Year? 0 points 0 points  What month? 0 points 0 points  What time? 0 points 0 points  Count back from 20 0 points 0 points  Months in reverse 0 points 0 points  Repeat phrase 0 points 0 points  Total Score 0 points 0 points    Immunizations Immunization History  Administered Date(s) Administered   Influenza,inj,Quad PF,6+ Mos 04/08/2015   Td 04/27/2001   Zoster Recombinant(Shingrix ) 09/28/2021, 01/28/2022    Screening Tests Health Maintenance  Topic Date Due   Hepatitis B Vaccines (1 of 3 - 19+ 3-dose series) Never done   Colonoscopy  Never done   DTaP/Tdap/Td (2 - Tdap) 04/28/2011   Cervical Cancer Screening (HPV/Pap Cotest)  01/18/2015   MAMMOGRAM  11/06/2021   COVID-19 Vaccine (1 - 2024-25 season) Never done   INFLUENZA VACCINE  11/25/2023   Medicare Annual Wellness (AWV)  11/02/2024   Hepatitis C Screening  Completed   HIV Screening  Completed   Zoster Vaccines- Shingrix   Completed   HPV  VACCINES  Aged Out   Meningococcal B Vaccine  Aged Out    Health Maintenance  Health Maintenance Due  Topic Date Due   Hepatitis B Vaccines (1 of 3 - 19+ 3-dose series) Never done   Colonoscopy  Never done   DTaP/Tdap/Td (2 - Tdap) 04/28/2011   Cervical Cancer Screening (HPV/Pap Cotest)  01/18/2015   MAMMOGRAM  11/06/2021   COVID-19 Vaccine (1 - 2024-25 season) Never done   Health Maintenance Items Addressed: Mammogram ordered, Cologuard Ordered, Referral sent to Optometry/Ophthalmology, GYN referral placed for cervical cancer screening  Additional Screening:  Vision Screening: Recommended annual ophthalmology exams for early detection of glaucoma and other disorders of the eye. Would you like a referral to an eye doctor? Yes   Referred to Dr. Adine Haddock Dental Screening: Recommended annual dental exams for proper oral hygiene  Community Resource Referral / Chronic Care Management: CRR required this visit?  No   CCM required this visit?  No   Plan:    I have personally reviewed and noted the following in the patient's chart:   Medical and social history Use of alcohol, tobacco or illicit drugs  Current medications and supplements including opioid prescriptions. Patient is not currently taking opioid prescriptions. Functional ability and status Nutritional status Physical activity Advanced directives List of other physicians Hospitalizations, surgeries, and ER visits in previous 12 months Vitals Screenings to include cognitive, depression, and falls Referrals and appointments  In addition, I have reviewed and discussed with patient certain preventive protocols, quality metrics, and best practice recommendations. A written personalized care plan for preventive services as well as general preventive health recommendations were provided to patient.   Tel Hevia, CMA   11/03/2023   After Visit Summary: (Mail) Due to this being a telephonic visit, the after visit  summary with patients personalized plan was offered to patient via mail   Notes: Nothing significant to report at this time.

## 2023-11-07 ENCOUNTER — Ambulatory Visit: Payer: Medicare Other | Admitting: Internal Medicine

## 2023-11-09 ENCOUNTER — Encounter (INDEPENDENT_AMBULATORY_CARE_PROVIDER_SITE_OTHER): Payer: Self-pay | Admitting: *Deleted

## 2023-11-14 ENCOUNTER — Encounter: Payer: Self-pay | Admitting: Internal Medicine

## 2023-11-25 LAB — CBC WITH DIFFERENTIAL/PLATELET
Basophils Absolute: 0.1 x10E3/uL (ref 0.0–0.2)
Basos: 1 %
EOS (ABSOLUTE): 0.3 x10E3/uL (ref 0.0–0.4)
Eos: 3 %
Hematocrit: 37.2 % (ref 34.0–46.6)
Hemoglobin: 12.3 g/dL (ref 11.1–15.9)
Immature Grans (Abs): 0.1 x10E3/uL (ref 0.0–0.1)
Immature Granulocytes: 1 %
Lymphocytes Absolute: 2.7 x10E3/uL (ref 0.7–3.1)
Lymphs: 34 %
MCH: 31.7 pg (ref 26.6–33.0)
MCHC: 33.1 g/dL (ref 31.5–35.7)
MCV: 96 fL (ref 79–97)
Monocytes Absolute: 0.6 x10E3/uL (ref 0.1–0.9)
Monocytes: 7 %
Neutrophils Absolute: 4.2 x10E3/uL (ref 1.4–7.0)
Neutrophils: 54 %
Platelets: 149 x10E3/uL — ABNORMAL LOW (ref 150–450)
RBC: 3.88 x10E6/uL (ref 3.77–5.28)
RDW: 12.5 % (ref 11.7–15.4)
WBC: 7.9 x10E3/uL (ref 3.4–10.8)

## 2023-11-25 LAB — BASIC METABOLIC PANEL WITH GFR
BUN/Creatinine Ratio: 15 (ref 9–23)
BUN: 12 mg/dL (ref 6–24)
CO2: 16 mmol/L — ABNORMAL LOW (ref 20–29)
Calcium: 9.5 mg/dL (ref 8.7–10.2)
Chloride: 107 mmol/L — ABNORMAL HIGH (ref 96–106)
Creatinine, Ser: 0.82 mg/dL (ref 0.57–1.00)
Glucose: 116 mg/dL — ABNORMAL HIGH (ref 70–99)
Potassium: 4.1 mmol/L (ref 3.5–5.2)
Sodium: 143 mmol/L (ref 134–144)
eGFR: 83 mL/min/1.73 (ref >59)

## 2023-11-25 LAB — IRON,TIBC AND FERRITIN PANEL
Ferritin: 77 ng/mL (ref 15–150)
Iron Saturation: 29 % (ref 15–55)
Iron: 71 ug/dL (ref 27–159)
Total Iron Binding Capacity: 249 ug/dL — ABNORMAL LOW (ref 250–450)
UIBC: 178 ug/dL (ref 131–425)

## 2023-11-25 LAB — B12 AND FOLATE PANEL
Folate: 16.3 ng/mL (ref >3.0)
Vitamin B-12: 451 pg/mL (ref 232–1245)

## 2023-11-25 IMAGING — CT CT HEAD W/O CM
3 series · 16 of 47 positions shown, 19 images · non-contrast
Comparison: 08/05/2020

CLINICAL DATA: Left leg pain and decreased mobility, initial
encounter

EXAM:
CT HEAD WITHOUT CONTRAST
TECHNIQUE: Contiguous axial images were obtained from the base of the skull
through the vertex without intravenous contrast.

[Series 2: head w o · axial · 0.43mm/px · z∈[-111,+19]mm · 10 of 32 slices shown, 13 images]
[im 3/32  brain]
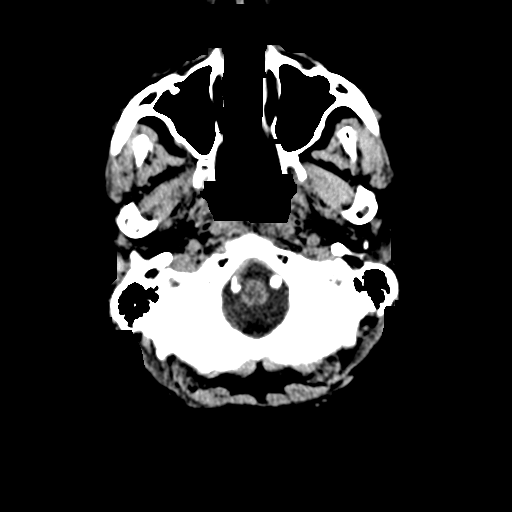
[im 3/32  bone]
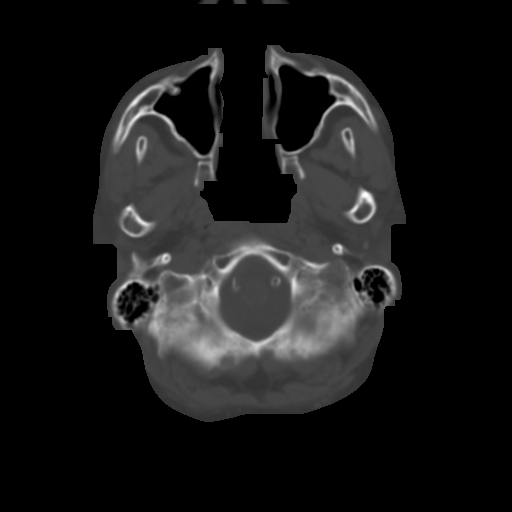
[im 6/32  brain]
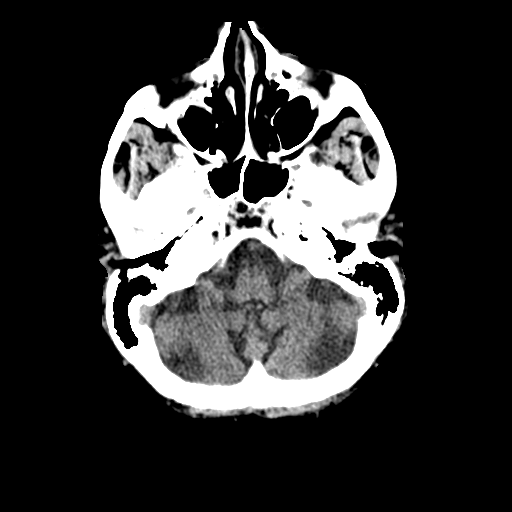
[im 9/32  brain]
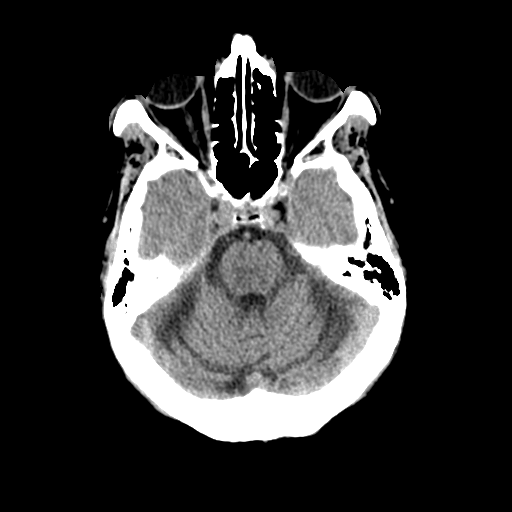
[im 11/32  brain]
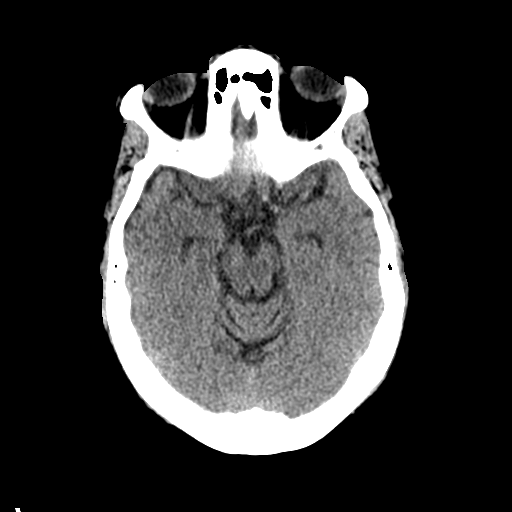
[im 14/32  brain]
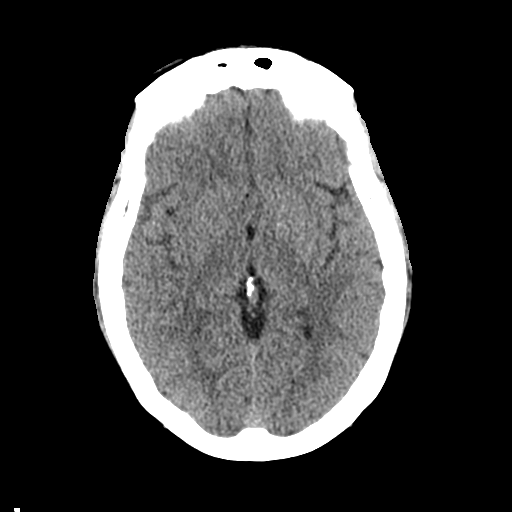
[im 14/32  bone]
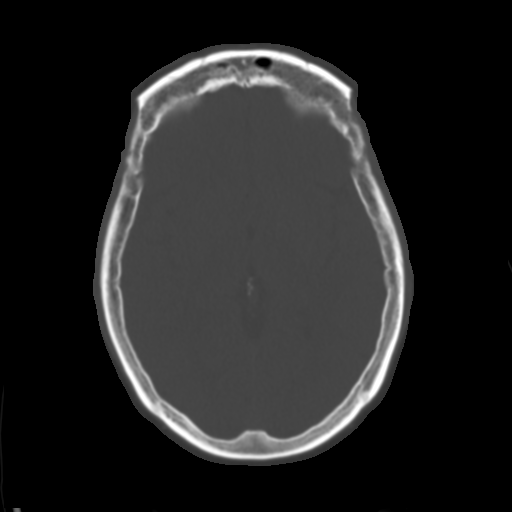
[im 18/32  brain]
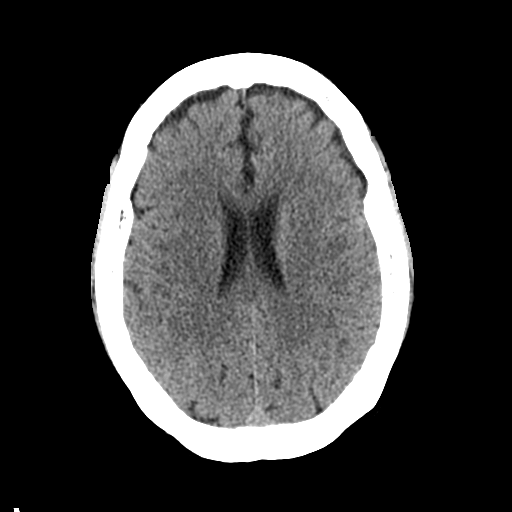
[im 21/32  brain]
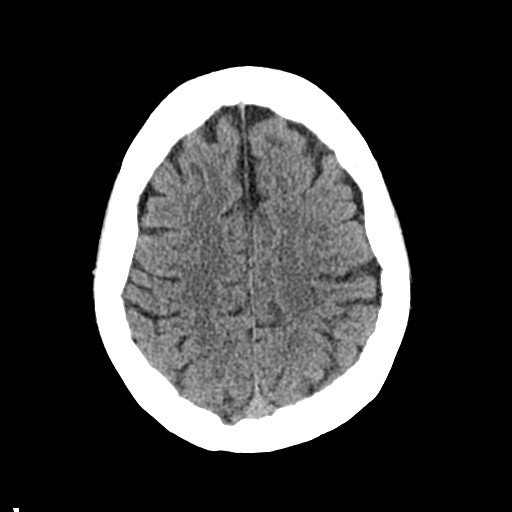
[im 24/32  brain]
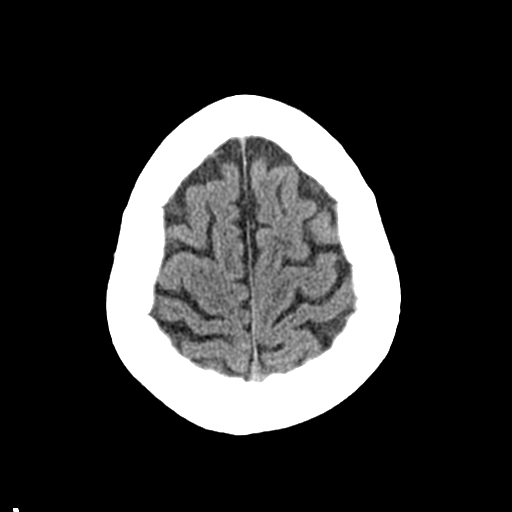
[im 26/32  brain]
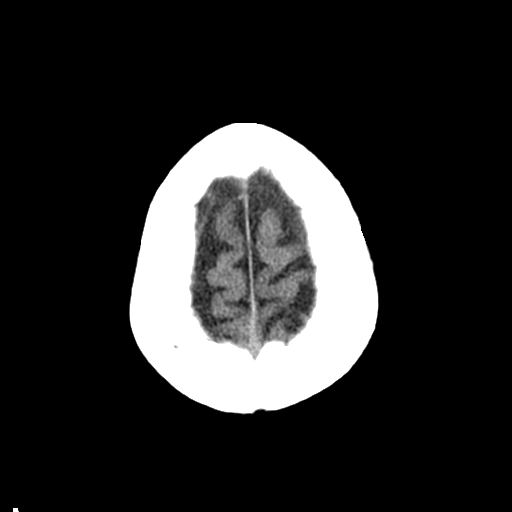
[im 26/32  bone]
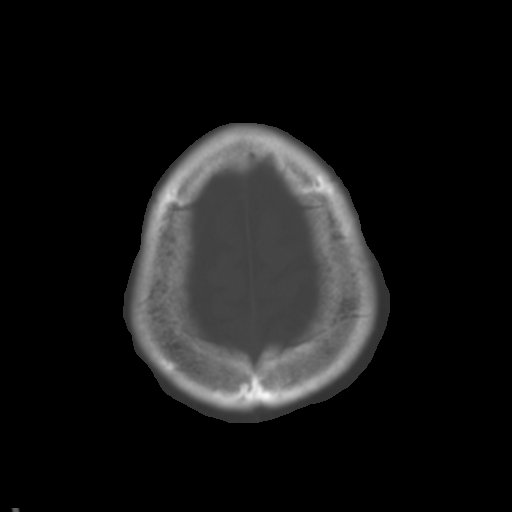
[im 29/32  brain]
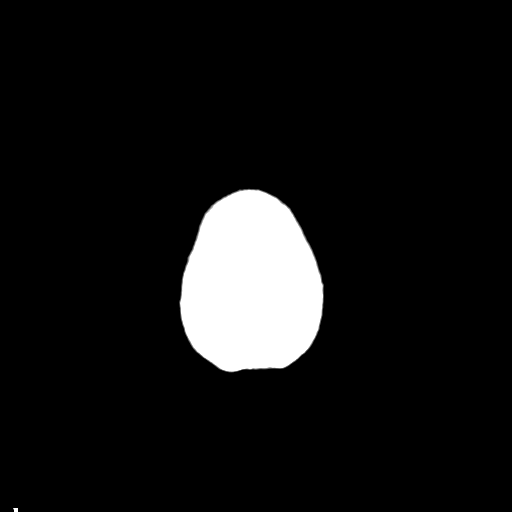

[Series 4: coronal soft · coronal · 0.31mm/px · 3 of 70 slices shown]
[im 24/70  brain]
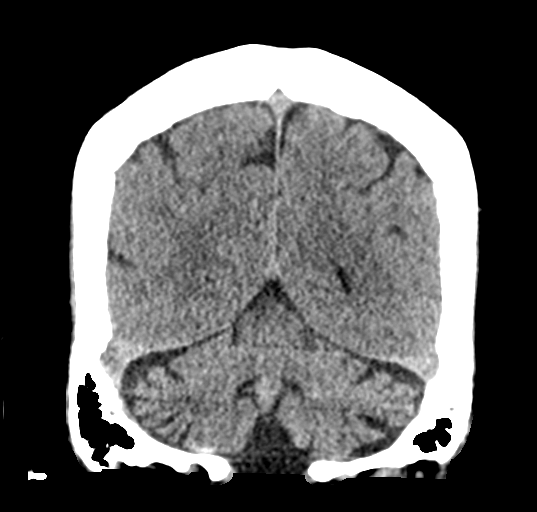
[im 31/70  brain]
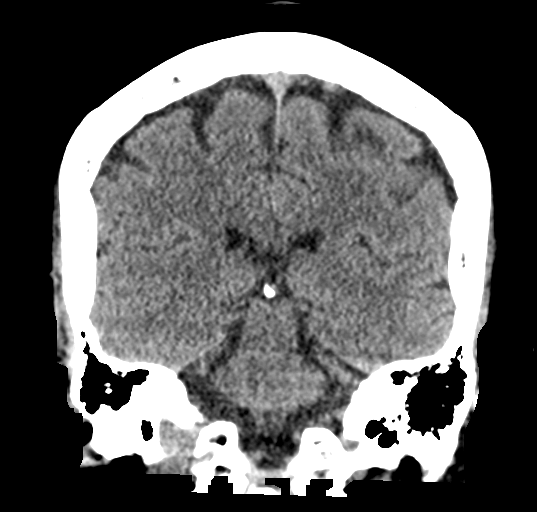
[im 39/70  brain]
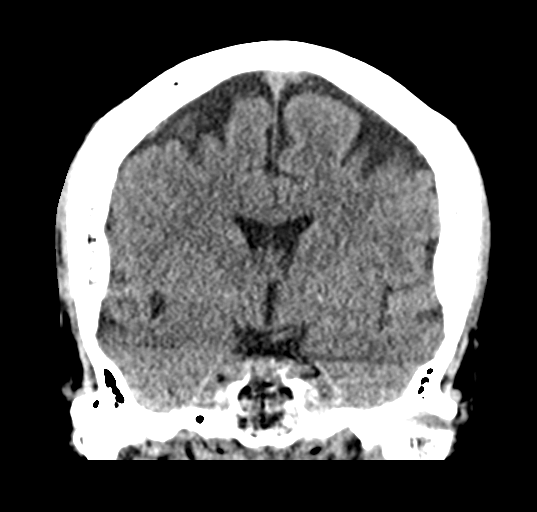

[Series 5: sagittal soft · sagittal · 0.31mm/px · 3 of 57 slices shown]
[im 19/57  brain]
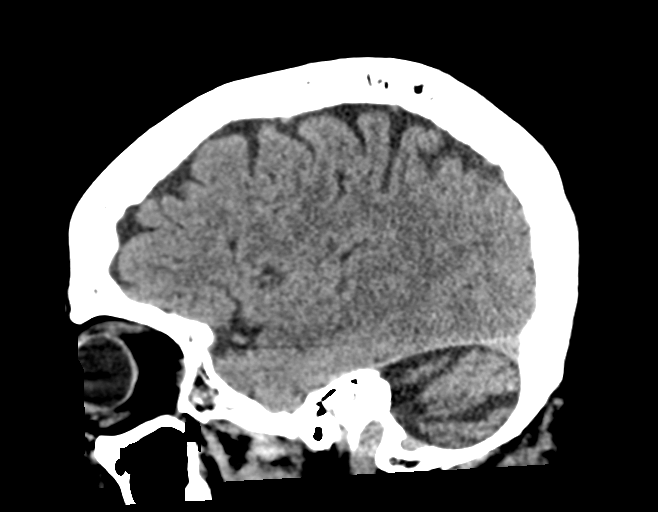
[im 29/57  brain]
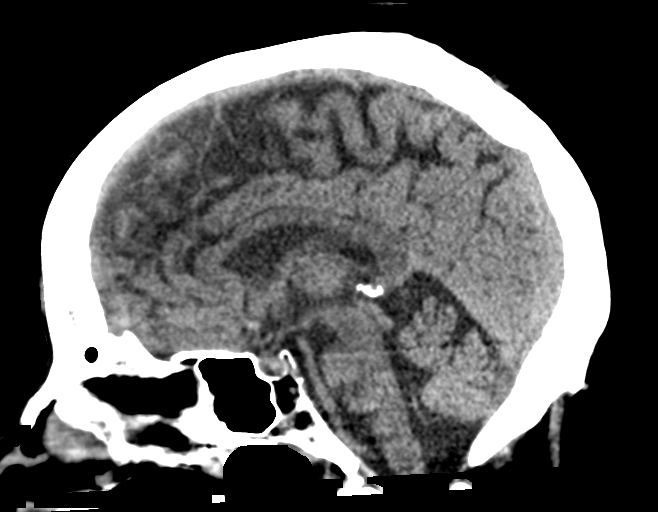
[im 38/57  brain]
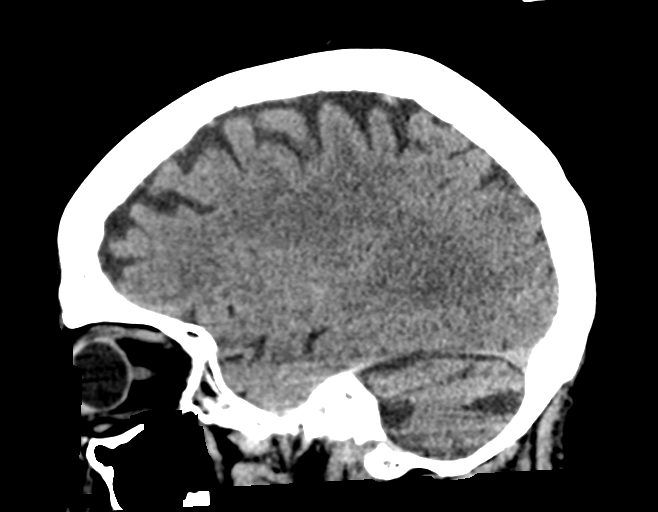

[16 of 47 positions shown; findings below may reference images not displayed]

FINDINGS: Brain: No evidence of acute infarction, hemorrhage, hydrocephalus,
extra-axial collection or mass lesion/mass effect. Mild atrophic
changes are noted.

Vascular: No hyperdense vessel or unexpected calcification.

Skull: Normal. Negative for fracture or focal lesion.

Sinuses/Orbits: No acute finding.

Other: None.
IMPRESSION: Mild atrophic changes stable from the prior exam. No acute
abnormality noted.

## 2023-11-25 IMAGING — DX DG CHEST 2V
3 series · 3 of 3 positions shown · non-contrast
Comparison: 05/16/2020

CLINICAL DATA: Leg swelling

EXAM:
CHEST - 2 VIEW

[chest lat (1 of 2)]
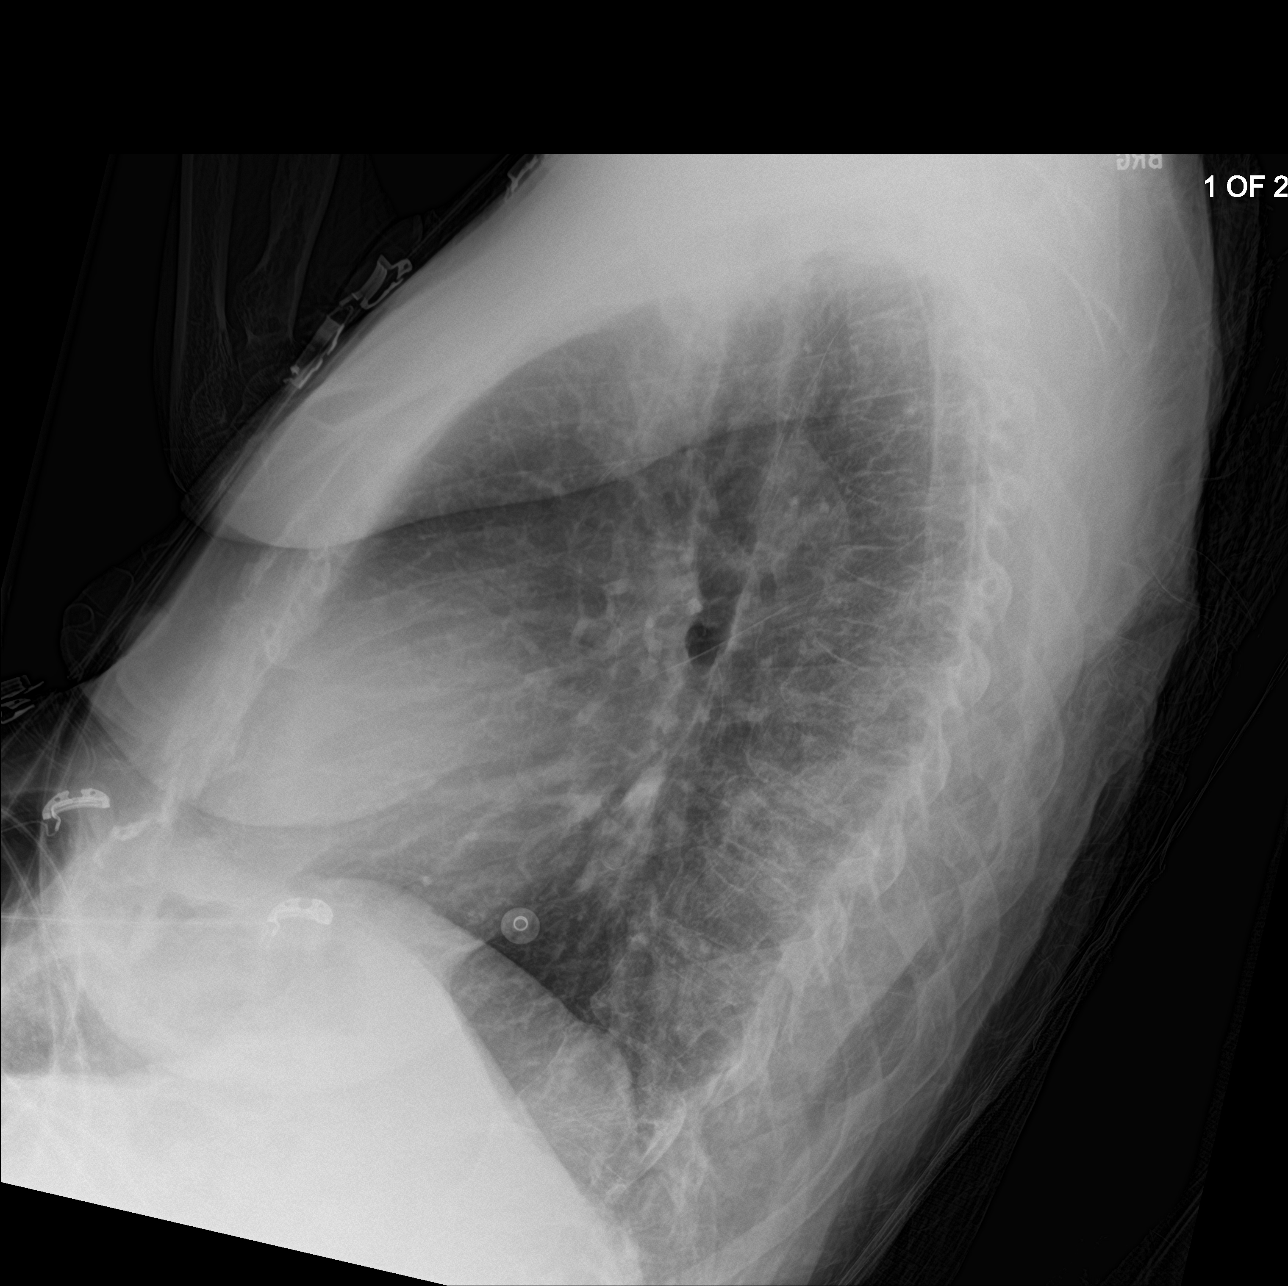

[chest ap]
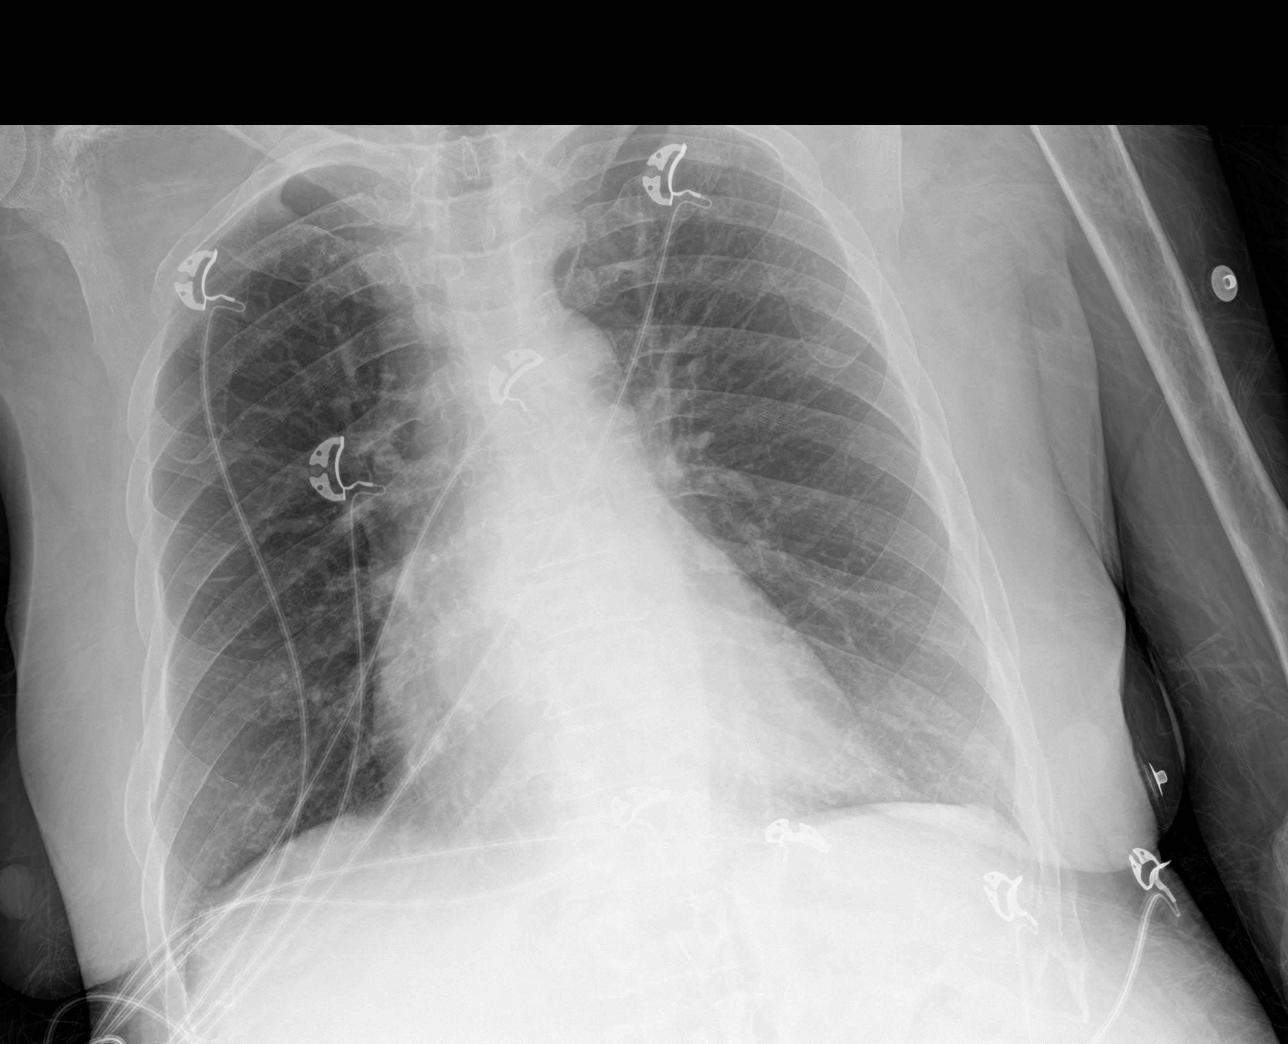

[chest lat (2 of 2)]
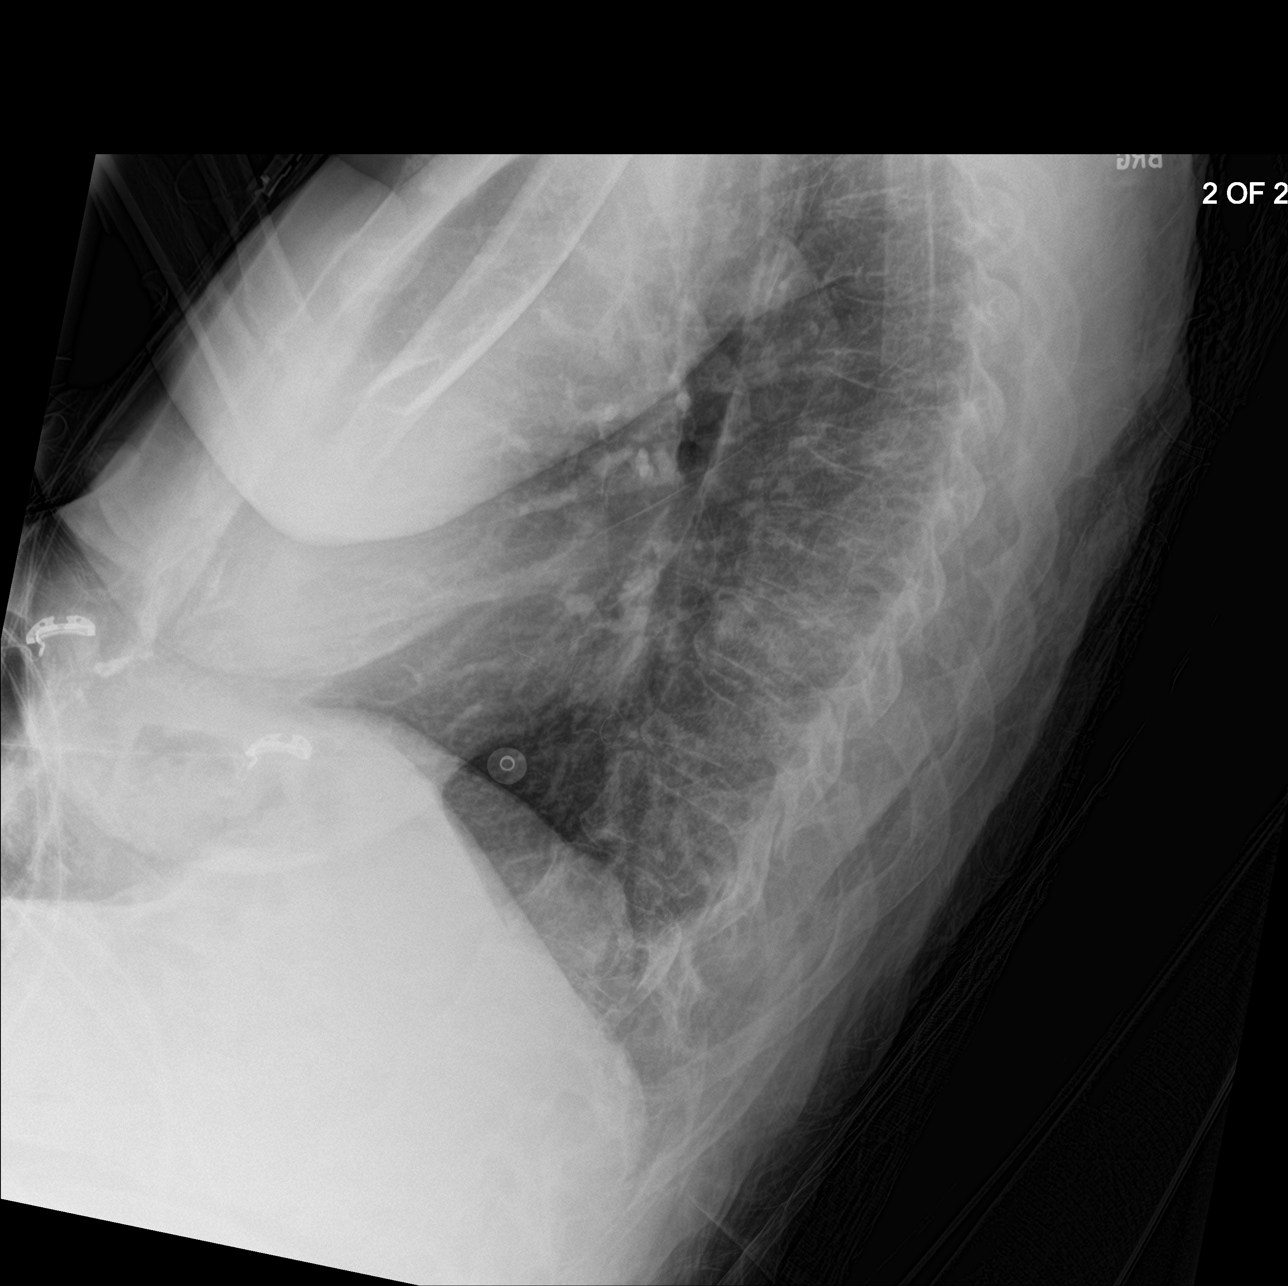

[3 of 3 positions shown; findings below may reference images not displayed]

FINDINGS: Check shadow is within normal limits. The lungs are well aerated
bilaterally. No focal infiltrate or effusion is seen. No bony
abnormality is noted.
IMPRESSION: No active cardiopulmonary disease.

## 2023-11-27 ENCOUNTER — Ambulatory Visit: Payer: Self-pay | Admitting: Internal Medicine

## 2023-12-12 ENCOUNTER — Ambulatory Visit: Admitting: Obstetrics & Gynecology

## 2023-12-30 ENCOUNTER — Encounter: Payer: Self-pay | Admitting: Internal Medicine

## 2023-12-30 ENCOUNTER — Ambulatory Visit: Admitting: Internal Medicine

## 2023-12-30 VITALS — BP 129/74 | HR 73 | Ht 68.0 in

## 2023-12-30 DIAGNOSIS — G8384 Todd's paralysis (postepileptic): Secondary | ICD-10-CM | POA: Diagnosis not present

## 2023-12-30 DIAGNOSIS — G8194 Hemiplegia, unspecified affecting left nondominant side: Secondary | ICD-10-CM | POA: Diagnosis not present

## 2023-12-30 DIAGNOSIS — R5381 Other malaise: Secondary | ICD-10-CM

## 2023-12-30 DIAGNOSIS — Z23 Encounter for immunization: Secondary | ICD-10-CM | POA: Diagnosis not present

## 2023-12-30 DIAGNOSIS — I10 Essential (primary) hypertension: Secondary | ICD-10-CM

## 2023-12-30 NOTE — Assessment & Plan Note (Signed)
 Has residual left-sided UE and LE weakness Compliance with antiepileptic emphasized On Keppra  Had referred to neurology, but did not follow-up

## 2023-12-30 NOTE — Progress Notes (Signed)
 Established Patient Office Visit  Subjective:  Patient ID: Cynthia Mclean, female    DOB: [birth year]  Age: 58 y.o. MRN: [id number]  CC:  Chief Complaint  Patient presents with   Follow-up    Pt following up, reports leg swelling and left side concerns from her stroke, left arm range of motion concerns.     HPI Cynthia Mclean is a 58 y.o. 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  HTN: BP is wnl now. She has been taking lisinopril  20 mg QD. Patient denies headache, dizziness, chest pain, dyspnea or palpitations.  Seizure disorder: She takes Keppra  500 mg BID currently.  Denies any recent seizure-like activity.  She has not been binge drinking alcohol since the last visit, takes about 2 beers at times.  She has not followed up with neurologist yet.  She complains of bilateral leg swelling.  Of note, she is mostly wheelchair dependent and has very limited mobility.  She has a rolling walker, but does not ambulate much.  She also reports stiffness of the left upper extremity and lower extremity.  Has a history of Todd's paralysis.  She has done home PT in the past, which had improved her range of motion, but did not continue home exercises after home PT.  Past Medical History:  Diagnosis Date   Alcohol dependency (HCC)    HELICOBACTER PYLORI [H. PYLORI] INFECTION 02/24/2010   Qualifier: Diagnosis of  By: Ezzard DEVONNA Sonny GORMAN.    Hypertension    IDA (iron  deficiency anemia)    required blood tranfusion   Nicotine addiction    Seizure disorder [facility])     Past Surgical History:  Procedure Laterality Date   btl     left knee surgery, fracture      Family History  Problem Relation Age of Onset   Anxiety disorder Sister     Social History   Socioeconomic History   Marital status: Single     Spouse name: Not on file   Number of children: 5   Years of education: Not on file   Highest education level: Not on file  Occupational History   Occupation: works at shelter  Tobacco Use   Smoking status: Never   Smokeless tobacco: Current    Types: Snuff  Substance and Sexual Activity   Alcohol use: Not Currently    Comment: last drank last night 1 40 oz   Drug use: No   Sexual activity: Yes    Birth control/protection: Surgical  Other Topics Concern   Not on file  Social History Narrative   Daughter Deleta is patients caregiver. Dependent on daughter for most of ADL's   Social Drivers of Health   Financial Resource Strain: Low Risk  (11/03/2023)   Overall Financial Resource Strain (CARDIA)    Difficulty of Paying Living Expenses: Not hard at all  Food Insecurity: No Food Insecurity (11/03/2023)   Hunger Vital Sign    Worried About Running Out of Food in the Last Year: Never true    Ran Out of Food in the Last Year: Never true  Transportation Needs: No Transportation Needs (11/03/2023)   PRAPARE - Administrator, Civil Service (Medical): No    Lack of Transportation (Non-Medical): No  Physical Activity: Sufficiently Active (11/03/2023)   Exercise Vital Sign    Days  of Exercise per Week: 7 days    Minutes of Exercise per Session: 30 min  Stress: No Stress Concern Present (11/03/2023)   [employer] of Occupational Health - Occupational Stress Questionnaire    Feeling of Stress: Not at all  Social Connections: Socially Isolated (11/03/2023)   Social Connection and Isolation Panel    Frequency of Communication with Friends and Family: More than three times a week    Frequency of Social Gatherings with Friends and Family: More than three times a week    Attends Religious Services: Never    Database administrator or Organizations: No    Attends Banker Meetings: Never    Marital Status: Never married  Intimate Partner Violence: Not At Risk  (11/03/2023)   Humiliation, Afraid, Rape, and Kick questionnaire    Fear of Current or Ex-Partner: No    Emotionally Abused: No    Physically Abused: No    Sexually Abused: No    Outpatient Medications Prior to Visit  Medication Sig Dispense Refill   aspirin  EC 81 MG tablet Take 1 tablet (81 mg total) by mouth daily. Swallow whole. 30 tablet 3   atorvastatin  (LIPITOR) 20 MG tablet TAKE 1 TABLET(20 MG) BY MOUTH DAILY 90 tablet 3   folic acid  (FOLVITE ) 1 MG tablet Take 1 tablet (1 mg total) by mouth daily. 30 tablet 1   levETIRAcetam  (KEPPRA ) 500 MG tablet Take 1 tablet (500 mg total) by mouth 2 (two) times daily. 60 tablet 5   lisinopril  (ZESTRIL ) 20 MG tablet TAKE 1 TABLET(20 MG) BY MOUTH DAILY 90 tablet 1   magnesium  oxide (MAGOX 400) 400 (240 Mg) MG tablet Take 1 tablet (400 mg total) by mouth daily. 30 tablet 1   pantoprazole  (PROTONIX ) 40 MG tablet Take 1 tablet (40 mg total) by mouth daily. 90 tablet 1   UNABLE TO FIND Med Name: Adult diapers,Pads,Wipes. DX:R32 1 each 99   No facility-administered medications prior to visit.    No Known Allergies  ROS Review of Systems  Constitutional:  Negative for chills and fever.  HENT:  Negative for congestion, sinus pressure, sinus pain and sore throat.   Eyes:  Negative for pain and discharge.  Respiratory:  Negative for cough and shortness of breath.   Cardiovascular:  Positive for leg swelling. Negative for chest pain and palpitations.  Gastrointestinal:  Negative for abdominal pain, diarrhea, nausea and vomiting.  Endocrine: Negative for polydipsia and polyuria.  Genitourinary:  Negative for dysuria and hematuria.  Musculoskeletal:  Positive for arthralgias (Left hip). Negative for neck pain and neck stiffness.  Skin:  Negative for rash.  Neurological:  Positive for seizures, weakness and numbness. Negative for dizziness.  Psychiatric/Behavioral:  Positive for sleep disturbance. Negative for agitation and behavioral problems.        Objective:    Physical Exam Vitals reviewed.  Constitutional:      General: She is not in acute distress.    Appearance: She is not diaphoretic.     Comments: In wheelchair  HENT:     Head: Normocephalic and atraumatic.     Nose: Nose normal. No congestion.     Mouth/Throat:     Mouth: Mucous membranes are moist.     Pharynx: No posterior oropharyngeal erythema.  Eyes:     General: No scleral icterus.    Extraocular Movements: Extraocular movements intact.  Cardiovascular:     Rate and Rhythm: Normal rate and regular rhythm.     Heart sounds:  Normal heart sounds. No murmur heard. Pulmonary:     Breath sounds: Normal breath sounds. No wheezing or rales.  Musculoskeletal:     Cervical back: Neck supple. No tenderness.     Left hip: No tenderness. Decreased range of motion.     Right lower leg: Edema (1+) present.     Left lower leg: Edema (1+) present.  Skin:    General: Skin is warm.     Findings: No rash.  Neurological:     General: No focal deficit present.     Mental Status: She is alert and oriented to person, place, and time.     Sensory: No sensory deficit.     Motor: Weakness (Left UE and LE, muscle strength 2/5) present.     Gait: Gait abnormal.  Psychiatric:        Mood and Affect: Mood normal.        Behavior: Behavior normal.     BP 129/74   Pulse 73   Ht 5' 8 (1.727 m)   LMP 05/14/2013   SpO2 98%   BMI 18.25 kg/m  Wt Readings from Last 3 Encounters:  11/03/23 120 lb (54.4 kg)  09/29/23 121 lb 0.5 oz (54.9 kg)  09/01/23 129 lb 10.1 oz (58.8 kg)    Lab Results  Component Value Date   TSH 2.826 09/29/2023   Lab Results  Component Value Date   WBC 7.9 11/24/2023   HGB 12.3 11/24/2023   HCT 37.2 11/24/2023   MCV 96 11/24/2023   PLT 149 (L) 11/24/2023   Lab Results  Component Value Date   NA 143 11/24/2023   K 4.1 11/24/2023   CO2 16 (L) 11/24/2023   GLUCOSE 116 (H) 11/24/2023   BUN 12 11/24/2023   CREATININE 0.82 11/24/2023    BILITOT 1.2 09/29/2023   ALKPHOS 46 09/29/2023   AST 40 09/29/2023   ALT 17 09/29/2023   PROT 7.0 09/29/2023   ALBUMIN 2.3 (L) 09/29/2023   CALCIUM  9.5 11/24/2023   ANIONGAP 5 10/04/2023   EGFR 83 11/24/2023   Lab Results  Component Value Date   CHOL 140 11/16/2022   Lab Results  Component Value Date   HDL 68 11/16/2022   Lab Results  Component Value Date   LDLCALC 60 11/16/2022   Lab Results  Component Value Date   TRIG 53 11/16/2022   Lab Results  Component Value Date   CHOLHDL 2.1 11/16/2022   Lab Results  Component Value Date   HGBA1C 5.0 11/16/2022      Assessment & Plan:   Problem List Items Addressed This Visit       Cardiovascular and Mediastinum   Essential hypertension - Primary   BP Readings from Last 1 Encounters:  12/30/23 129/74   Well-controlled with Lisinopril  20 mg Recently decreased dose of lisinopril  from 40 mg to 20 mg and DCed Amlodipine  during recent hospitalization due to hypotension Counseled for compliance with the medications Advised DASH diet and moderate exercise/walking as tolerated        Nervous and Auditory   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)   Due to Todd's paralysis Has a history of seizure disorder On Keppra  currently Has worsening of stiffness of LUE and LLE recently - referred to PT      Relevant Orders   Ambulatory referral to Physical Therapy     Other   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)  Has residual left-sided UE and LE weakness Compliance with antiepileptic emphasized On Keppra  Had referred to neurology, but did not follow-up      Physical deconditioning   History of Todd's paralysis with residual left UE and LE weakness Had benefited from home PT in the past, will try to get PT Has a rolling walker for better mobility Currently dependent for ADLs and IADLs on her  daughter      Relevant Orders   Ambulatory referral to Physical Therapy   Other Visit Diagnoses       Encounter for immunization       Relevant Orders   Flu vaccine trivalent PF, 6mos and older(Flulaval,Afluria,Fluarix,Fluzone) (Completed)       No orders of the defined types were placed in this encounter.   Follow-up: Return in about 6 months (around 06/28/2024) for HTN and seizures.    Suzzane MARLA Blanch, MD

## 2023-12-30 NOTE — Patient Instructions (Addendum)
 Please schedule Mammogram.  Please continue to take medications as prescribed.  Please continue to follow low salt diet and ambulate as tolerated.

## 2023-12-30 NOTE — Assessment & Plan Note (Addendum)
 Due to Cynthia Mclean's paralysis Has a history of seizure disorder On Keppra  currently Has worsening of stiffness of LUE and LLE recently - referred to PT

## 2023-12-30 NOTE — Assessment & Plan Note (Addendum)
 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

## 2023-12-30 NOTE — Assessment & Plan Note (Signed)
 BP Readings from Last 1 Encounters:  12/30/23 129/74   Well-controlled with Lisinopril  20 mg Recently decreased dose of lisinopril  from 40 mg to 20 mg and DCed Amlodipine  during recent hospitalization due to hypotension Counseled for compliance with the medications Advised DASH diet and moderate exercise/walking as tolerated

## 2024-01-11 ENCOUNTER — Encounter: Payer: Self-pay | Admitting: Neurology

## 2024-01-11 ENCOUNTER — Ambulatory Visit: Admitting: Neurology

## 2024-01-13 ENCOUNTER — Ambulatory Visit (HOSPITAL_COMMUNITY)
Admission: RE | Admit: 2024-01-13 | Discharge: 2024-01-13 | Disposition: A | Discharge status: Home / Self Care | Source: Ambulatory Visit | Attending: Internal Medicine | Admitting: Internal Medicine

## 2024-01-13 ENCOUNTER — Encounter (HOSPITAL_COMMUNITY): Payer: Self-pay

## 2024-01-13 DIAGNOSIS — Z1231 Encounter for screening mammogram for malignant neoplasm of breast: Secondary | ICD-10-CM | POA: Diagnosis present

## 2024-02-14 ENCOUNTER — Ambulatory Visit (HOSPITAL_COMMUNITY)

## 2024-05-04 ENCOUNTER — Telehealth: Payer: Self-pay

## 2024-05-04 NOTE — Telephone Encounter (Signed)
 Copied from CRM [id number]. Topic: Clinical - Order For Equipment >> May 04, 2024  1:03 PM Delon HERO wrote: Reason for CRM: Cynthia Mclean & [facility] - Patient's God Daughter is calling to follow up on [employer] script to be sent [facility], also needing gloves, & wipes.  Also calling to follow up on the bathroom ralles and elevated toilet seat.  '  CB- [phone]

## 2024-05-07 ENCOUNTER — Other Ambulatory Visit: Payer: Self-pay

## 2024-05-07 DIAGNOSIS — R32 Unspecified urinary incontinence: Secondary | ICD-10-CM

## 2024-05-07 MED ORDER — UNABLE TO FIND: 99 refills | Status: DC

## 2024-05-07 NOTE — Telephone Encounter (Signed)
 Order faxed

## 2024-05-08 ENCOUNTER — Telehealth: Payer: Self-pay | Admitting: Internal Medicine

## 2024-05-08 NOTE — Telephone Encounter (Signed)
 Placed in provider box.  Copy upfront Fax to [phone] Notify pt when done

## 2024-05-09 ENCOUNTER — Other Ambulatory Visit: Payer: Self-pay

## 2024-05-09 DIAGNOSIS — R32 Unspecified urinary incontinence: Secondary | ICD-10-CM

## 2024-05-09 MED ORDER — UNABLE TO FIND: 99 refills | Status: AC

## 2024-06-29 ENCOUNTER — Ambulatory Visit: Admitting: Internal Medicine

## 2024-11-05 ENCOUNTER — Ambulatory Visit
